# Patient Record
Sex: Male | Born: 1945 | Race: White | Hispanic: No | Marital: Married | Smoking: Former smoker
Health system: Southern US, Community
[De-identification: ages and names within clinical notes are randomized; demographics above are authoritative.]

## PROBLEM LIST (undated history)

## (undated) ENCOUNTER — Ambulatory Visit: Admission: EM | Payer: Managed Care, Other (non HMO) | Source: Home / Self Care

## (undated) ENCOUNTER — Ambulatory Visit: Admission: EM | Source: Home / Self Care

## (undated) DIAGNOSIS — Z889 Allergy status to unspecified drugs, medicaments and biological substances status: Secondary | ICD-10-CM

## (undated) DIAGNOSIS — E785 Hyperlipidemia, unspecified: Secondary | ICD-10-CM

## (undated) DIAGNOSIS — J189 Pneumonia, unspecified organism: Secondary | ICD-10-CM

## (undated) DIAGNOSIS — K649 Unspecified hemorrhoids: Secondary | ICD-10-CM

## (undated) DIAGNOSIS — F419 Anxiety disorder, unspecified: Secondary | ICD-10-CM

## (undated) DIAGNOSIS — Z860100 Personal history of colon polyps, unspecified: Secondary | ICD-10-CM

## (undated) DIAGNOSIS — I2510 Atherosclerotic heart disease of native coronary artery without angina pectoris: Secondary | ICD-10-CM

## (undated) DIAGNOSIS — J449 Chronic obstructive pulmonary disease, unspecified: Secondary | ICD-10-CM

## (undated) DIAGNOSIS — K219 Gastro-esophageal reflux disease without esophagitis: Secondary | ICD-10-CM

## (undated) DIAGNOSIS — I10 Essential (primary) hypertension: Secondary | ICD-10-CM

## (undated) DIAGNOSIS — A4902 Methicillin resistant Staphylococcus aureus infection, unspecified site: Secondary | ICD-10-CM

## (undated) DIAGNOSIS — M1990 Unspecified osteoarthritis, unspecified site: Secondary | ICD-10-CM

## (undated) DIAGNOSIS — E119 Type 2 diabetes mellitus without complications: Secondary | ICD-10-CM

## (undated) HISTORY — PX: RETINAL TEAR REPAIR CRYOTHERAPY: SHX5304

## (undated) HISTORY — DX: Unspecified hemorrhoids: K64.9

## (undated) HISTORY — PX: EYE SURGERY: SHX253

## (undated) HISTORY — DX: Chronic obstructive pulmonary disease, unspecified: J44.9

## (undated) HISTORY — PX: COLONOSCOPY WITH ESOPHAGOGASTRODUODENOSCOPY (EGD): SHX5779

## (undated) HISTORY — DX: Personal history of colonic polyps: Z86.010

## (undated) HISTORY — DX: Personal history of colon polyps, unspecified: Z86.0100

## (undated) HISTORY — PX: CERVICAL SPINE SURGERY: SHX589

## (undated) HISTORY — DX: Type 2 diabetes mellitus without complications: E11.9

## (undated) HISTORY — PX: HEMORRHOID SURGERY: SHX153

## (undated) HISTORY — DX: Essential (primary) hypertension: I10

## (undated) HISTORY — DX: Unspecified osteoarthritis, unspecified site: M19.90

## (undated) HISTORY — DX: Hyperlipidemia, unspecified: E78.5

---

## 2006-11-06 ENCOUNTER — Ambulatory Visit: Payer: Self-pay | Admitting: Internal Medicine

## 2007-04-30 ENCOUNTER — Ambulatory Visit: Payer: Self-pay | Admitting: Family Medicine

## 2007-05-03 ENCOUNTER — Ambulatory Visit: Payer: Self-pay | Admitting: Internal Medicine

## 2007-07-23 ENCOUNTER — Ambulatory Visit: Payer: Self-pay | Admitting: Family Medicine

## 2007-07-23 IMAGING — CR DG CHEST 2V
1 series · 3 of 3 positions shown · non-contrast
Comparison: none

REASON FOR EXAM: congestion with sob
COMMENTS:

[Series 1: view not recorded · 0.17mm/px · 3 of 3 slices shown]
[im 1/3]
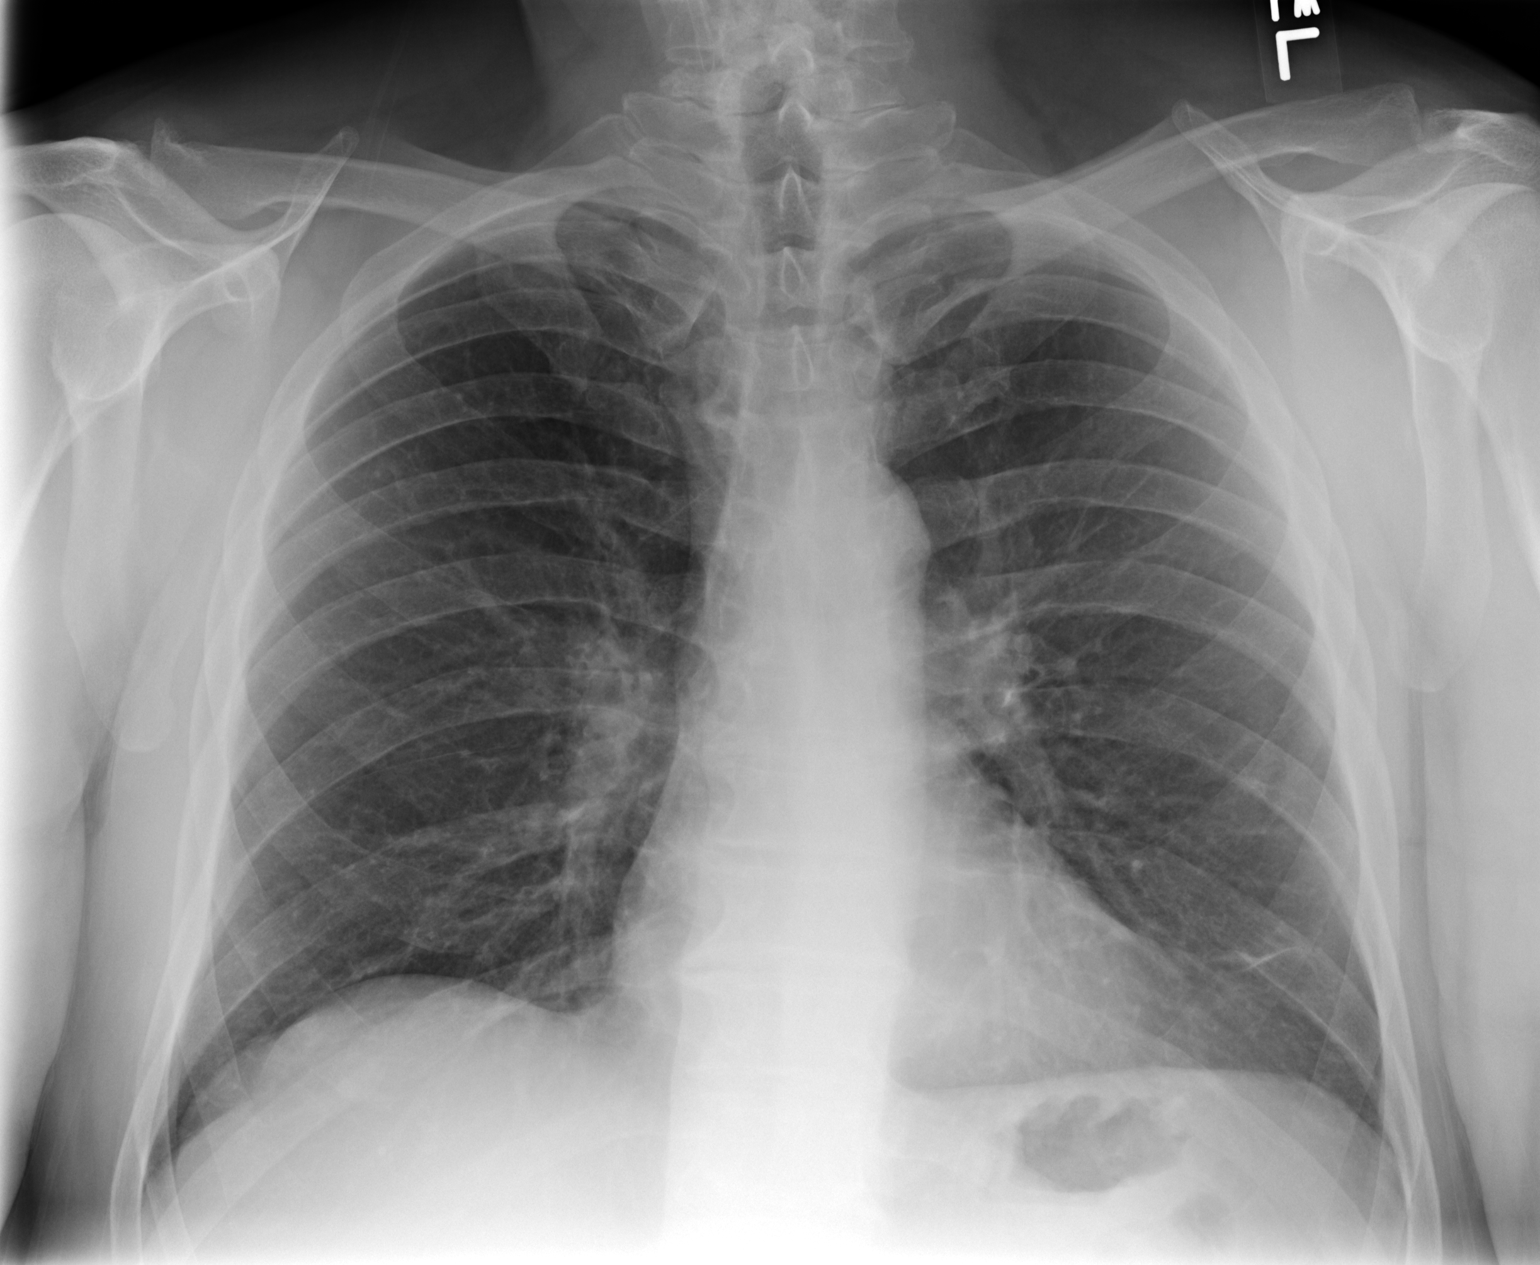
[im 2/3]
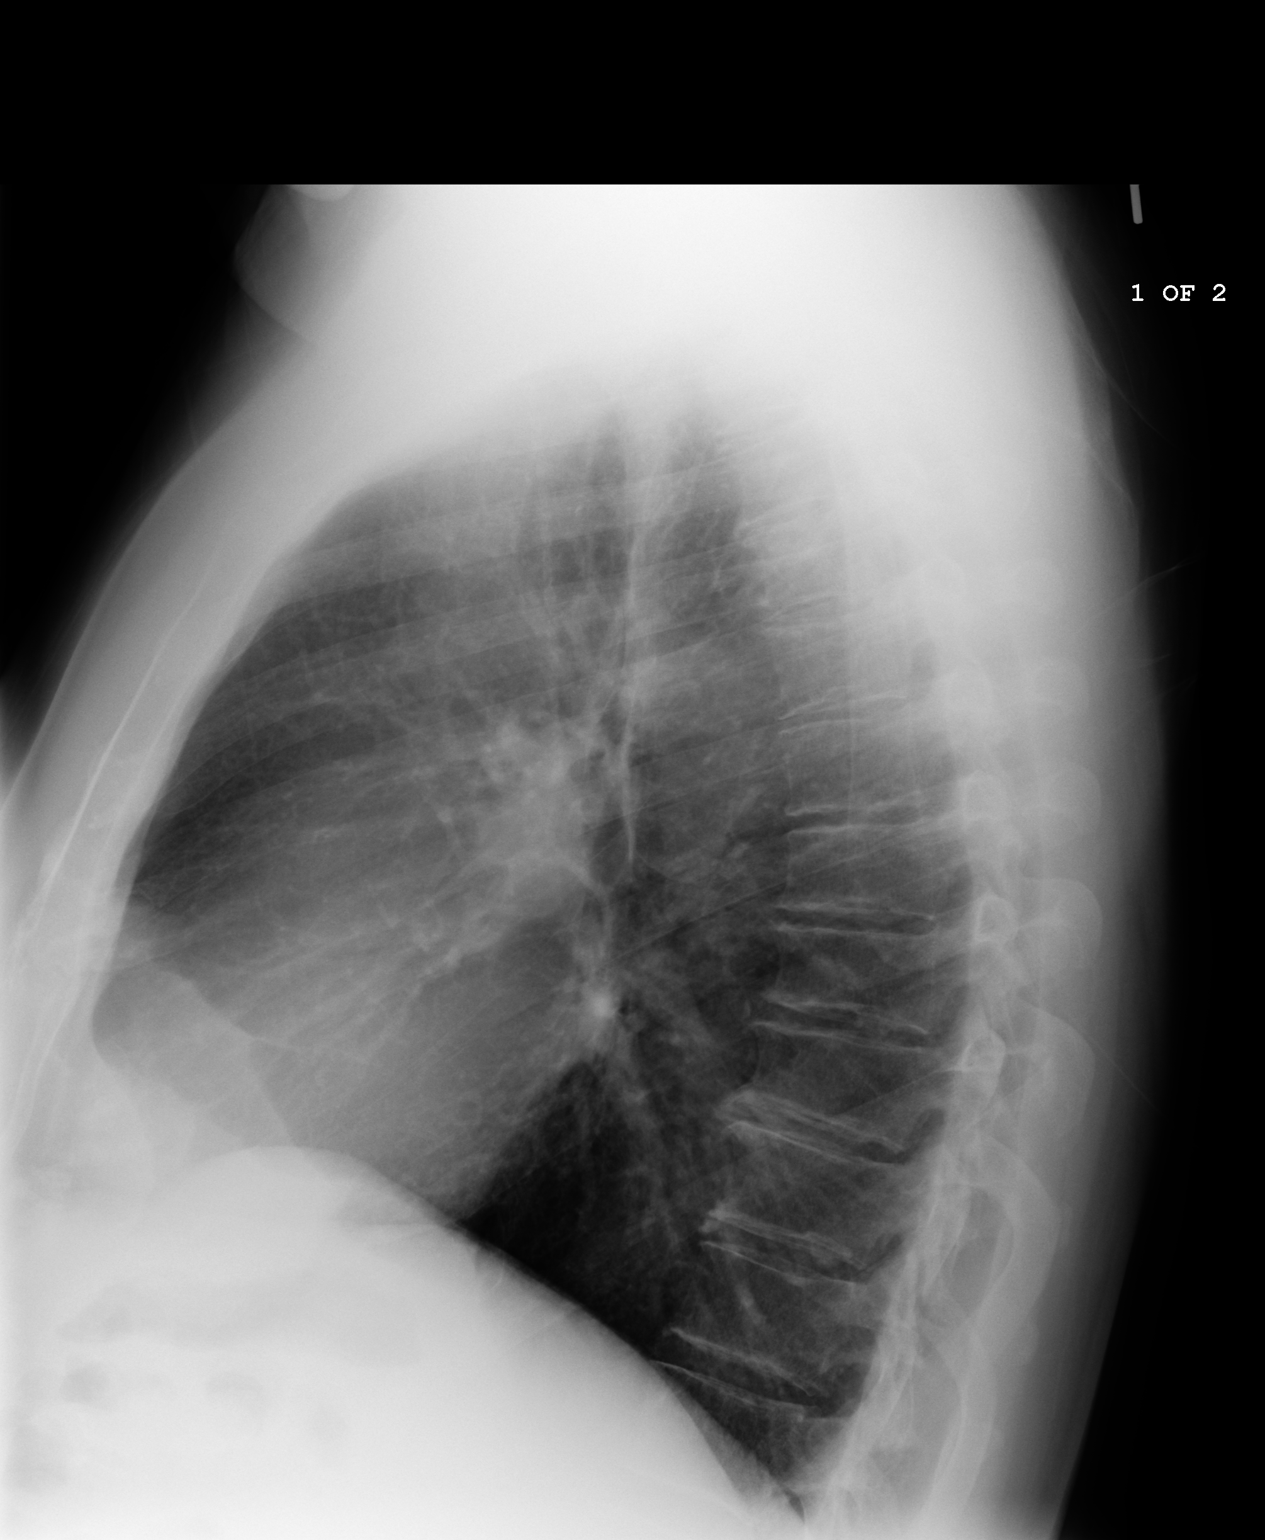
[im 3/3]
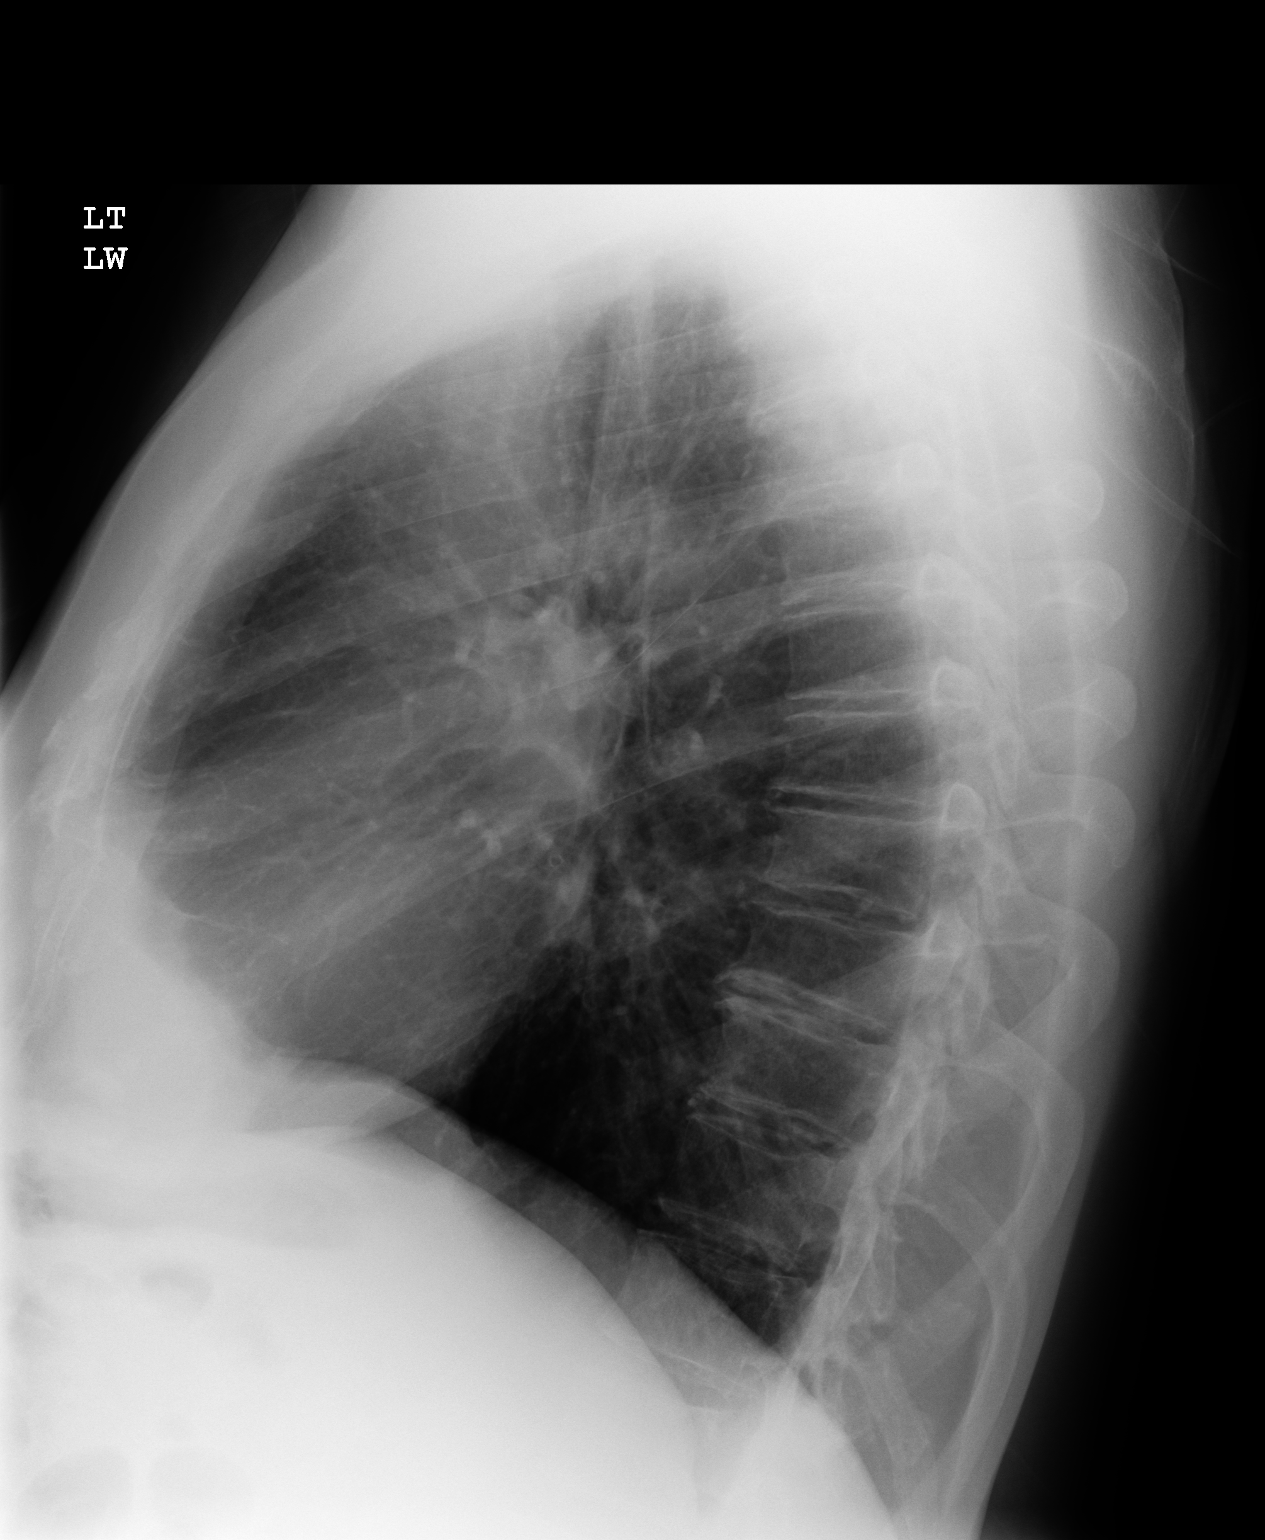

[3 of 3 positions shown; findings below may reference images not displayed]

PROCEDURE:     MDR - MDR CHEST PA(OR AP) AND LATERAL  - [DATE] [DATE]

RESULT:     There is no previous exam for comparison. There is mild
hyperinflation consistent with COPD. The lungs are clear. The heart and
pulmonary vessels are normal. The bony and mediastinal structures are
unremarkable. There is no effusion. There is no pneumothorax or evidence of
congestive failure.
IMPRESSION: No acute cardiopulmonary disease. Changes suggestive of
COPD.

## 2007-11-25 ENCOUNTER — Ambulatory Visit: Payer: Self-pay | Admitting: Family Medicine

## 2008-02-03 ENCOUNTER — Ambulatory Visit: Payer: Self-pay | Admitting: Internal Medicine

## 2008-02-17 DIAGNOSIS — I219 Acute myocardial infarction, unspecified: Secondary | ICD-10-CM

## 2008-02-17 HISTORY — DX: Acute myocardial infarction, unspecified: I21.9

## 2008-02-17 HISTORY — PX: CORONARY ARTERY BYPASS GRAFT: SHX141

## 2008-04-16 HISTORY — PX: VEIN BYPASS SURGERY: SHX833

## 2008-04-22 ENCOUNTER — Ambulatory Visit: Payer: Self-pay | Admitting: Family Medicine

## 2008-04-22 IMAGING — CR DG CHEST 2V
1 series · 2 of 2 positions shown · non-contrast
Comparison: none

REASON FOR EXAM: cough
COMMENTS:

PROCEDURE:     MDR - MDR CHEST PA(OR AP) AND LATERAL  - [DATE]  [DATE]
RESULT:     Mediastinal and hilar structures are normal. Changes consistant
with pleuroparenchymal scarring and/or COPD noted.
No acute cardiopulmonary disease noted.

[Series 1: view not recorded · 0.17mm/px · 2 of 2 slices shown]
[im 1/2]
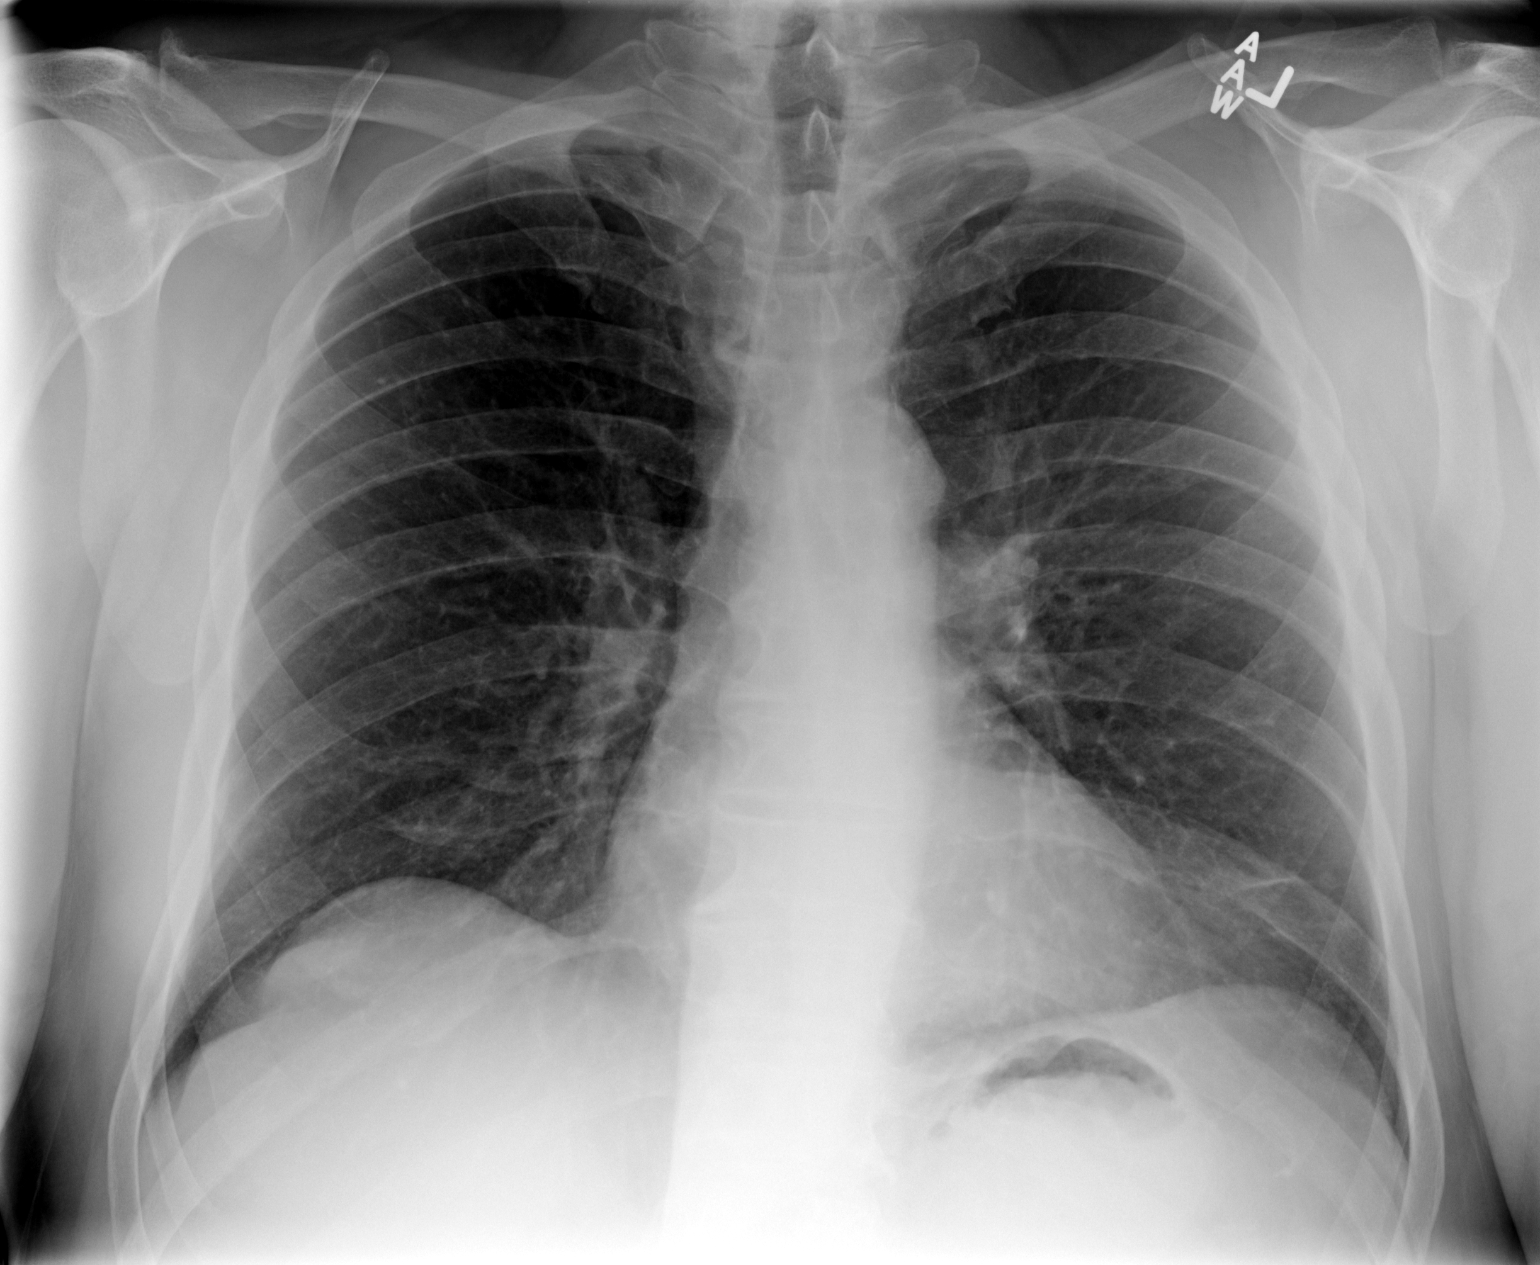
[im 2/2]
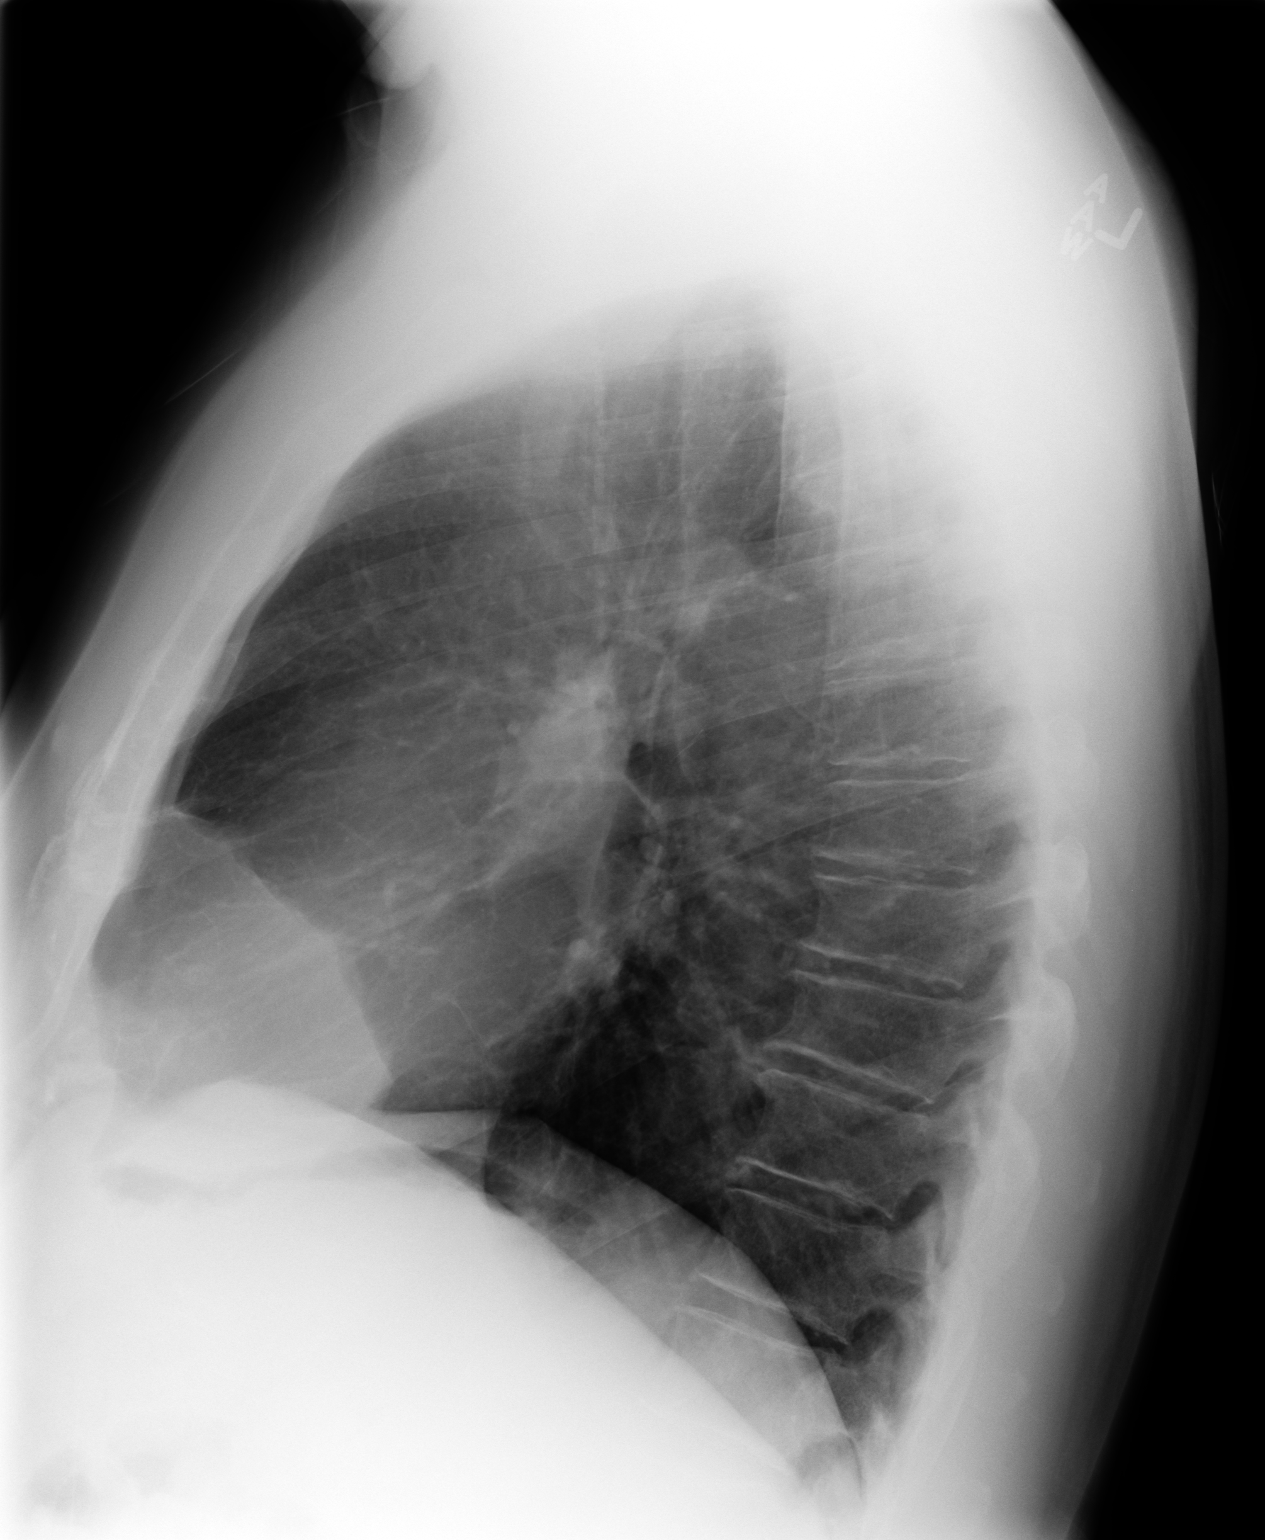

[2 of 2 positions shown; findings below may reference images not displayed]

IMPRESSION: No acute cardiopulmonary disease noted.

## 2009-01-03 ENCOUNTER — Ambulatory Visit: Payer: Self-pay | Admitting: Family Medicine

## 2009-01-03 ENCOUNTER — Emergency Department: Payer: Self-pay | Admitting: Unknown Physician Specialty

## 2009-01-03 IMAGING — CR DG CHEST 1V PORT
1 series · 1 of 1 positions shown · non-contrast
Comparison: none

REASON FOR EXAM: cp
COMMENTS:

[view not recorded]
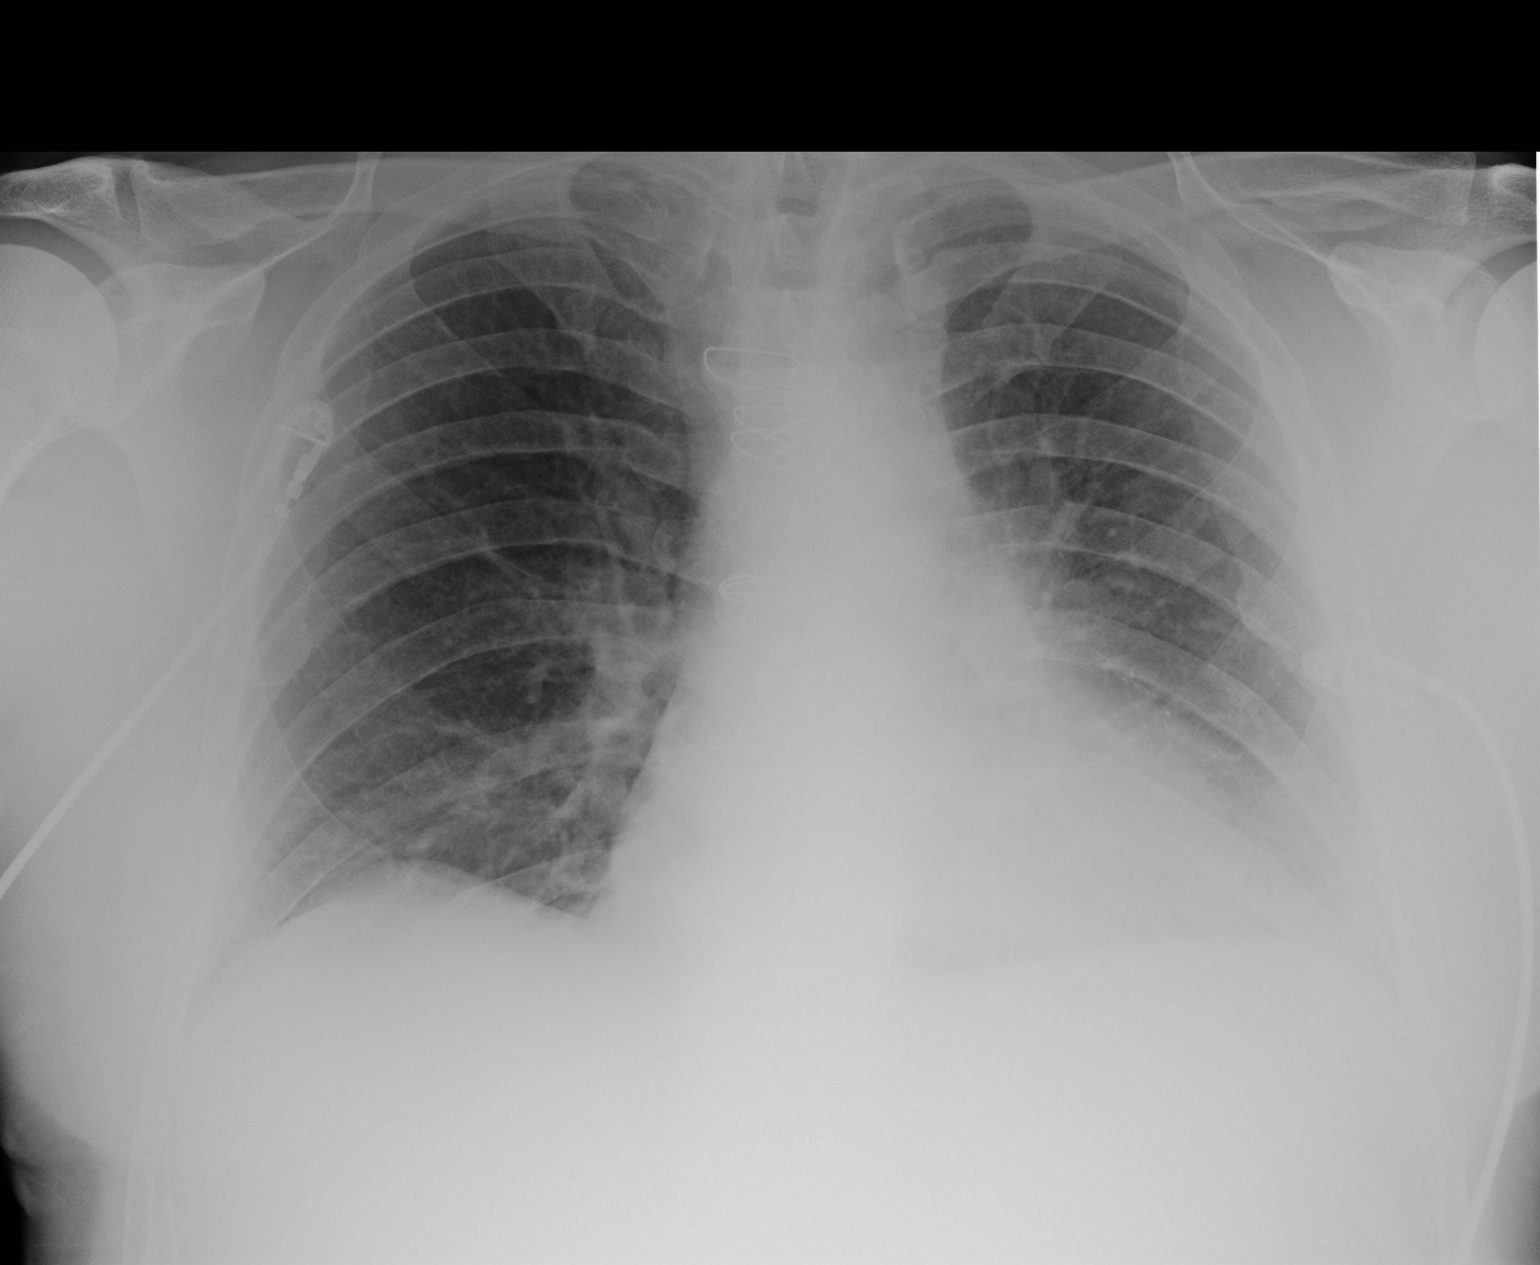

[1 of 1 positions shown; findings below may reference images not displayed]

PROCEDURE:     DXR - DXR PORTABLE CHEST SINGLE VIEW  - [DATE]  [DATE]

RESULT:     Comparison is made to the study [DATE].

The lungs are adequately inflated. The interstitial markings are increased
especially on the left. I suspect that there is lingular atelectasis. I see
no pleural effusion or pulmonary vascular congestion.
IMPRESSION: Increased density on the left likely reflects lingular
atelectasis. A followup PA and lateral chest x-ray would be of value.

## 2009-02-12 ENCOUNTER — Ambulatory Visit: Payer: Self-pay | Admitting: Family Medicine

## 2009-06-14 ENCOUNTER — Ambulatory Visit: Payer: Self-pay | Admitting: Family Medicine

## 2009-10-06 ENCOUNTER — Ambulatory Visit: Payer: Self-pay | Admitting: Family Medicine

## 2009-11-29 ENCOUNTER — Ambulatory Visit: Payer: Self-pay | Admitting: Internal Medicine

## 2010-02-03 ENCOUNTER — Ambulatory Visit: Payer: Self-pay | Admitting: Gastroenterology

## 2010-02-04 ENCOUNTER — Ambulatory Visit: Payer: Self-pay | Admitting: Gastroenterology

## 2010-02-06 LAB — PATHOLOGY REPORT

## 2010-03-12 ENCOUNTER — Ambulatory Visit: Payer: Self-pay | Admitting: Family Medicine

## 2010-04-10 ENCOUNTER — Ambulatory Visit: Payer: Self-pay | Admitting: Internal Medicine

## 2010-04-10 IMAGING — CR DG CHEST 2V
1 series · 4 of 4 positions shown · non-contrast
Comparison: none

REASON FOR EXAM: r/o pneumonia
COMMENTS:

[Series 1: view not recorded · 0.17mm/px · 4 of 4 slices shown]
[im 1/4]
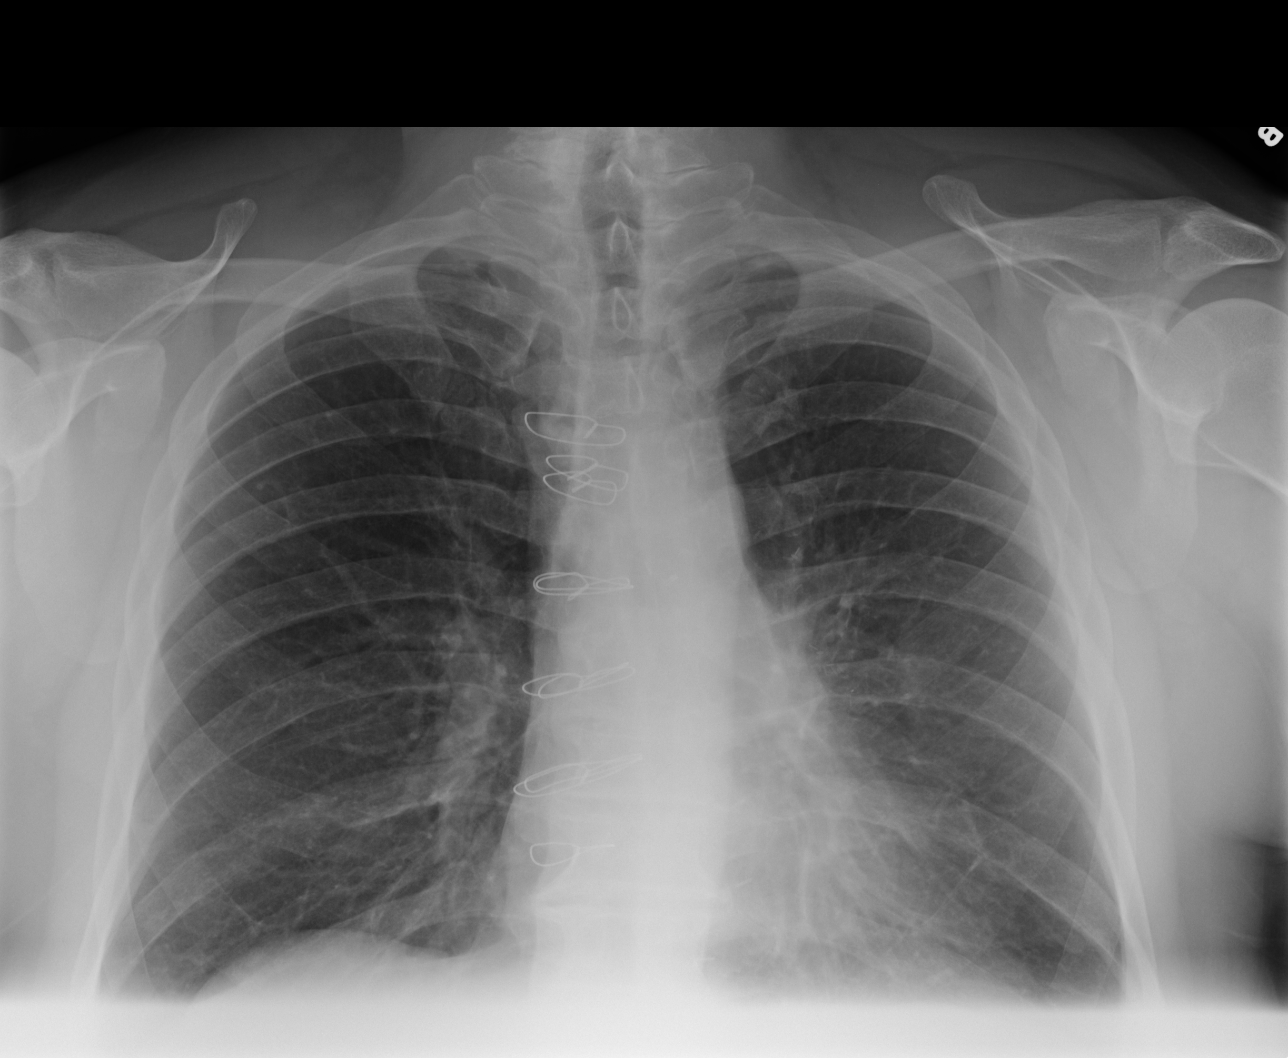
[im 2/4]
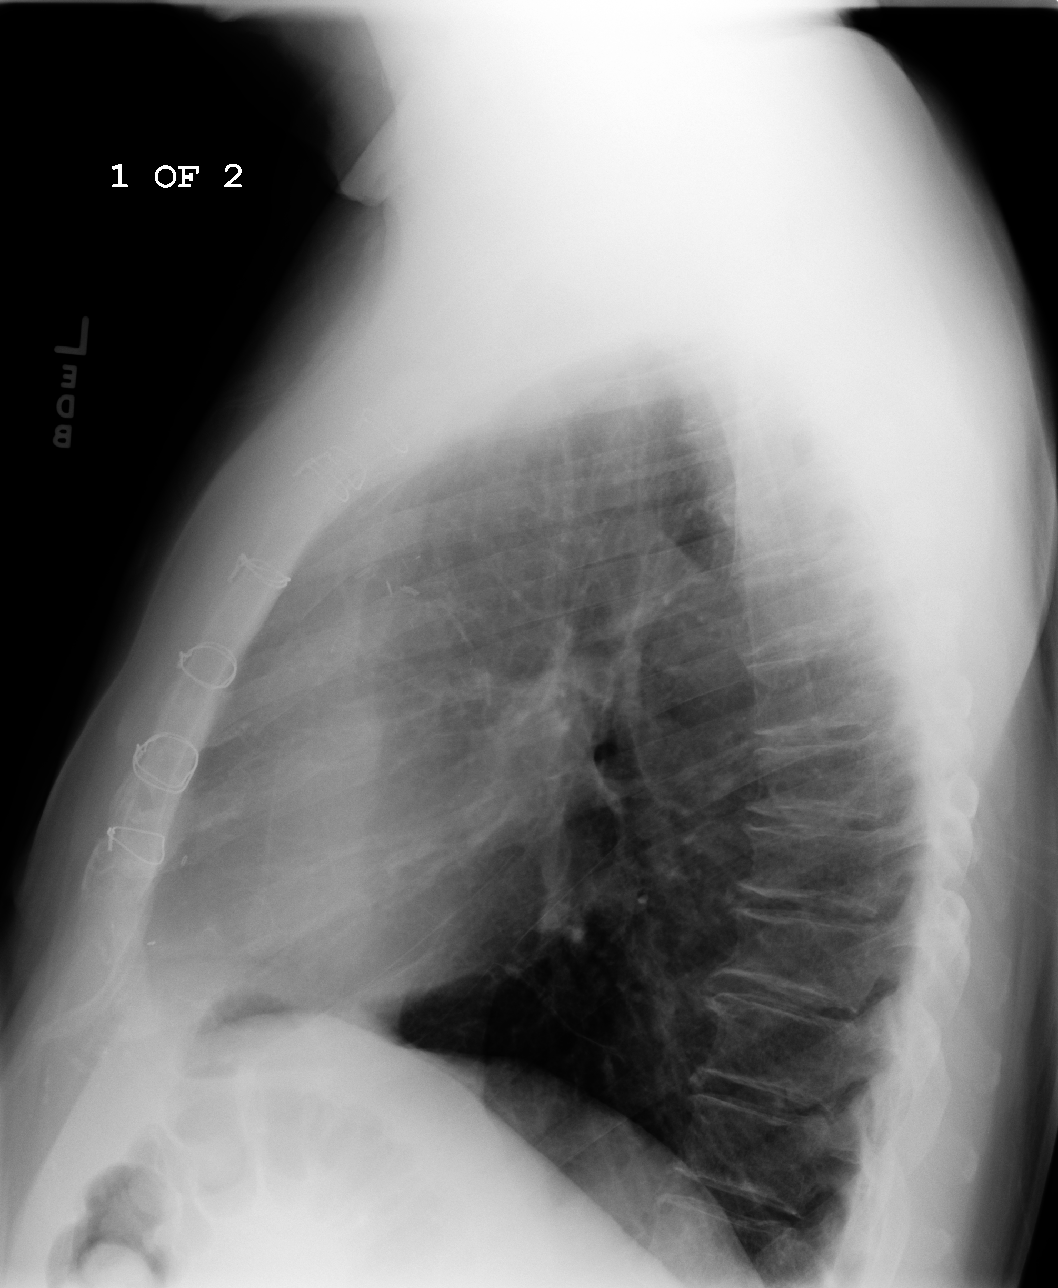
[im 3/4]
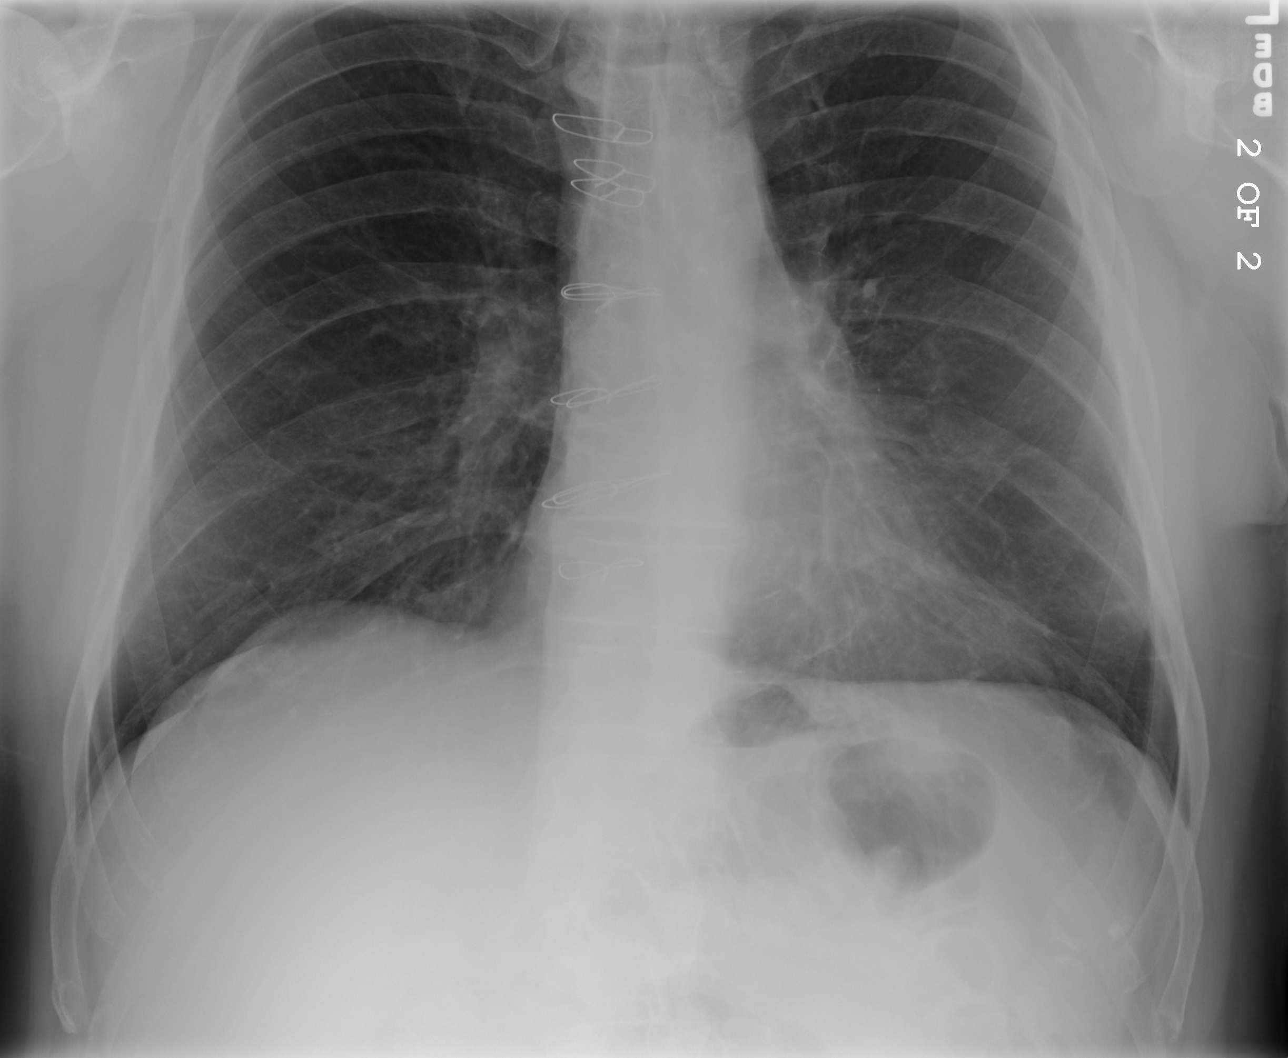
[im 4/4]
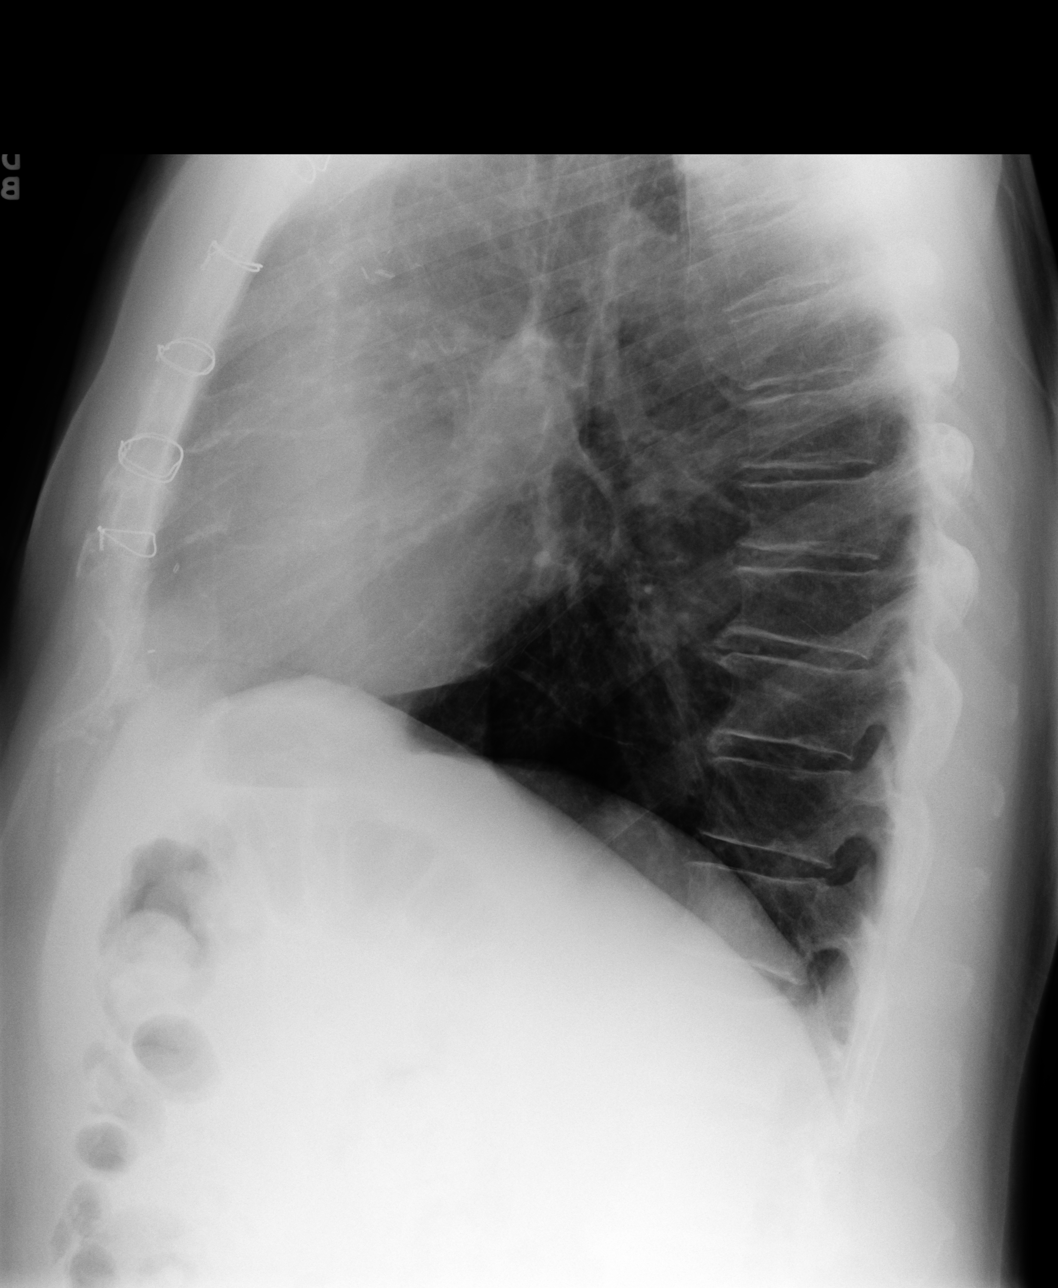

[4 of 4 positions shown; findings below may reference images not displayed]

PROCEDURE:     MDR - MDR CHEST PA(OR AP) AND LATERAL  - [DATE]  [DATE]

RESULT:     Comparison is made to the study of [DATE].

Sternotomy wires are present. The lungs are mildly hyperinflated. There is
no definite edema, infiltrate, effusion or pneumothorax. Degenerative
changes are seen in the spine.
IMPRESSION: 1. Findings consistent with COPD.
2. No acute cardiopulmonary disease evident.

## 2010-10-13 ENCOUNTER — Ambulatory Visit: Payer: Self-pay

## 2011-06-27 ENCOUNTER — Ambulatory Visit: Payer: Self-pay

## 2011-07-05 ENCOUNTER — Ambulatory Visit: Payer: Self-pay | Admitting: Internal Medicine

## 2011-08-04 ENCOUNTER — Emergency Department: Payer: Self-pay | Admitting: Internal Medicine

## 2011-08-04 LAB — CBC
HCT: 46.8 % (ref 40.0–52.0)
HGB: 16 g/dL (ref 13.0–18.0)
MCH: 33.7 pg (ref 26.0–34.0)
MCHC: 34.3 g/dL (ref 32.0–36.0)
MCV: 98 fL (ref 80–100)
Platelet: 160 10*3/uL (ref 150–440)
RBC: 4.76 10*6/uL (ref 4.40–5.90)
RDW: 13.3 % (ref 11.5–14.5)
WBC: 9.2 10*3/uL (ref 3.8–10.6)

## 2011-08-04 LAB — BASIC METABOLIC PANEL
Anion Gap: 8 (ref 7–16)
Calcium, Total: 8.7 mg/dL (ref 8.5–10.1)
EGFR (African American): >60
EGFR (Non-African Amer.): >60
Glucose: 115 mg/dL — ABNORMAL HIGH (ref 65–99)
Osmolality: 285 (ref 275–301)
Potassium: 4.4 mmol/L (ref 3.5–5.1)

## 2011-08-04 LAB — CK TOTAL AND CKMB (NOT AT ARMC): CK, Total: 102 U/L (ref 35–232)

## 2011-08-04 LAB — TROPONIN I
Troponin-I: <0.02 ng/mL
Troponin-I: <0.02 ng/mL

## 2011-08-04 IMAGING — CR DG CHEST 2V
1 series · 3 of 3 positions shown · non-contrast
Comparison: none

REASON FOR EXAM: chest pressure
COMMENTS:

[Series 1: w chest pa · 0.14mm/px · 3 of 3 slices shown]
[im 1/3]
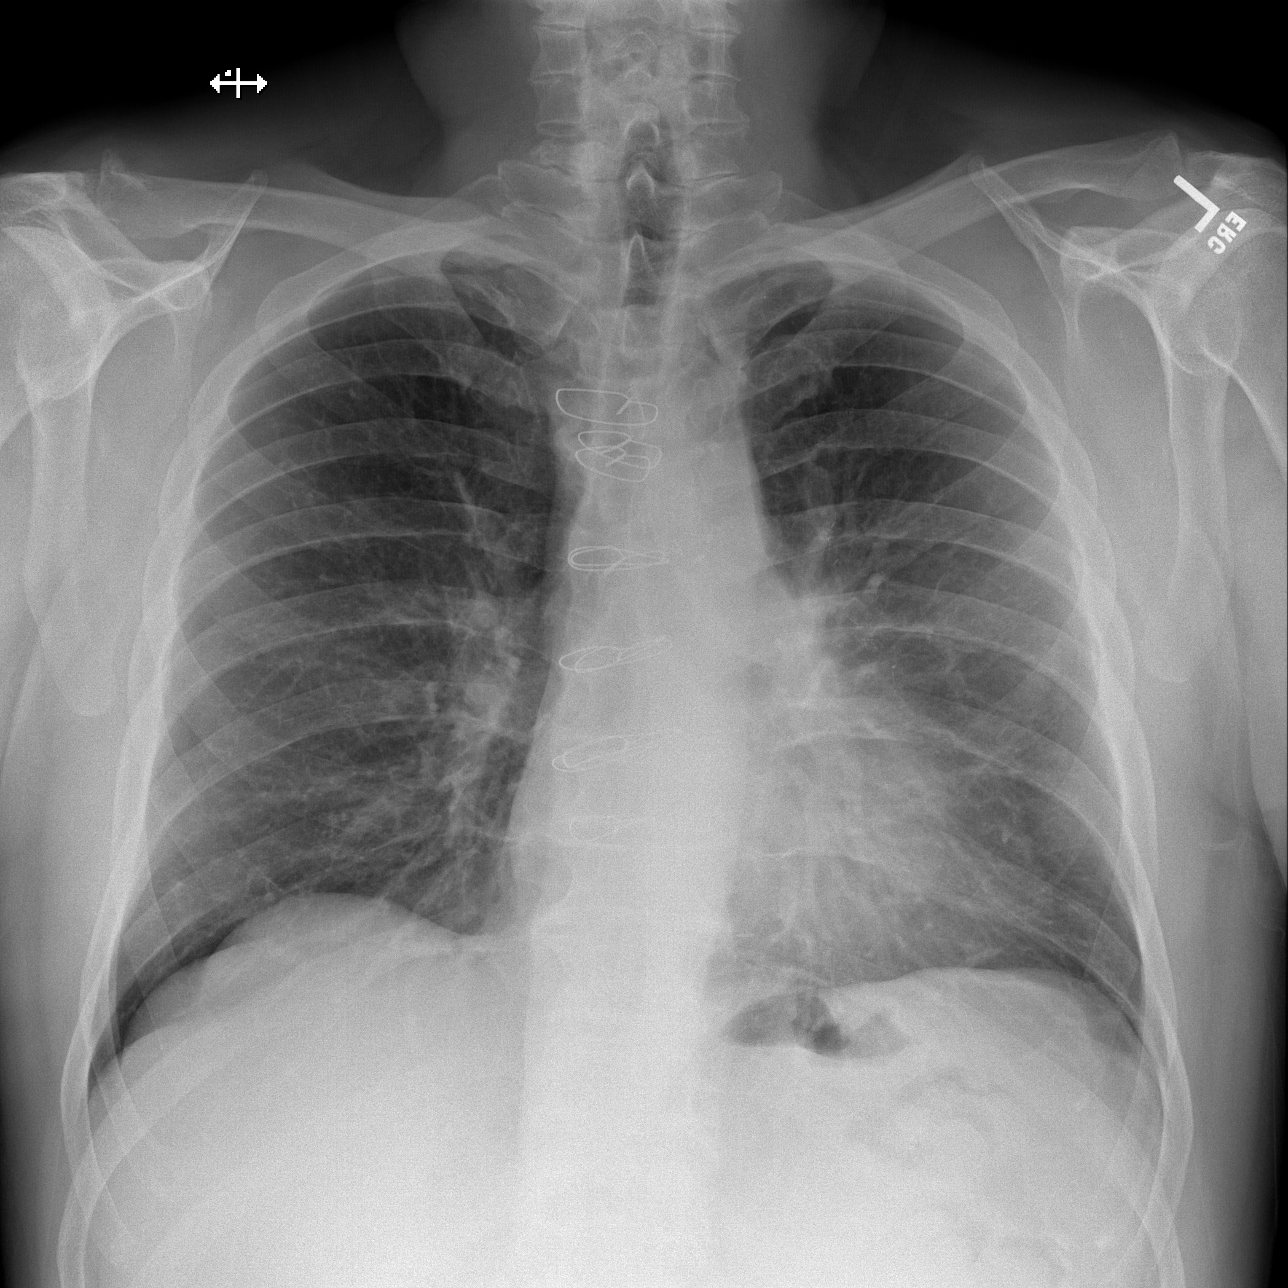
[im 2/3]
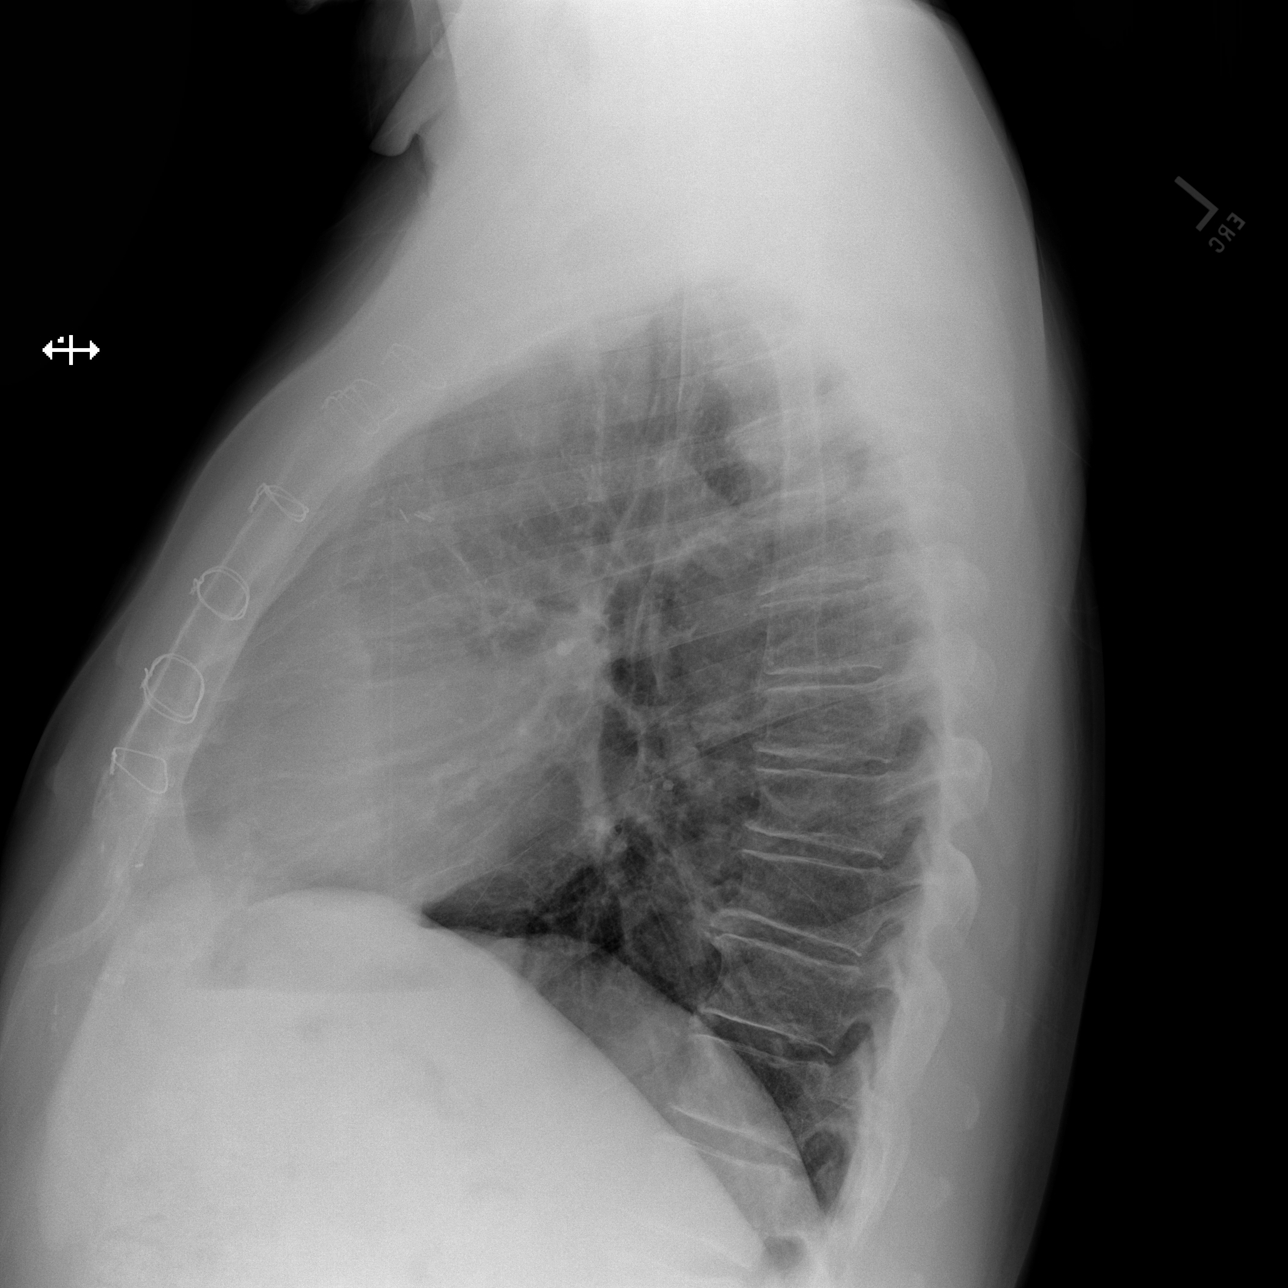
[im 3/3]
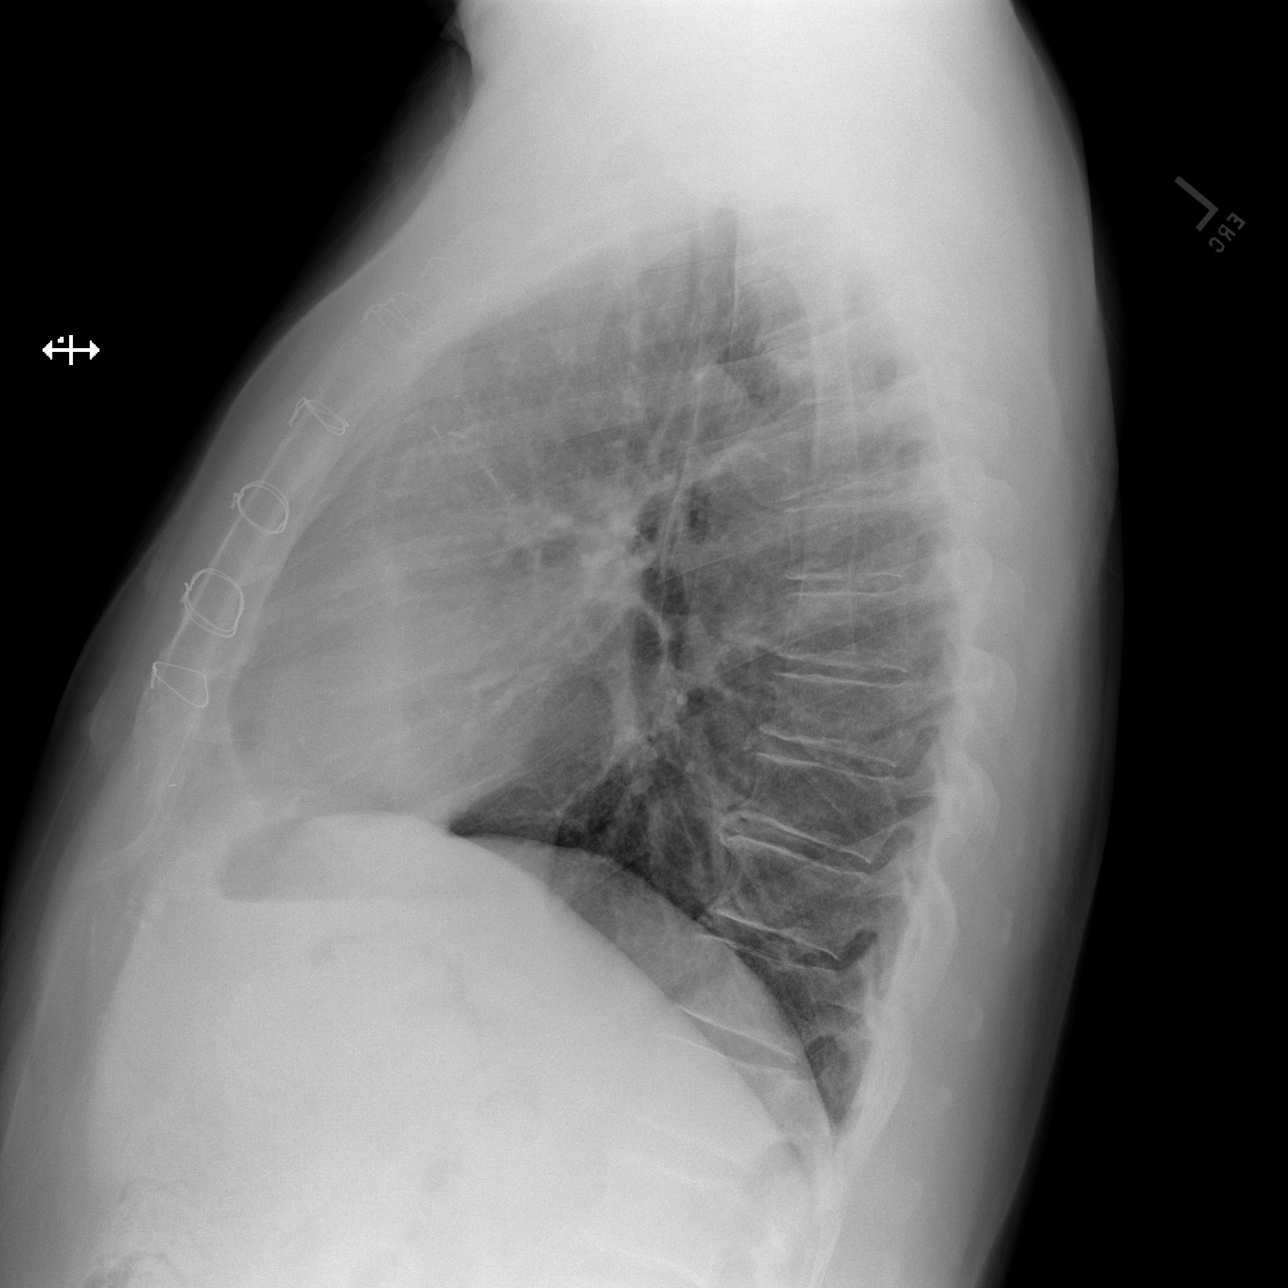

[3 of 3 positions shown; findings below may reference images not displayed]

PROCEDURE:     DXR - DXR CHEST PA (OR AP) AND LATERAL  - [DATE]  [DATE]

RESULT:     Comparison is made to the prior exam of [DATE]. The lung
fields are clear of infiltrate. The heart, mediastinal and osseous
structures show no significant abnormalities. Postoperative changes
consistent with prior CABG are again observed.
IMPRESSION: No acute changes are identified.

[REDACTED]

## 2011-10-25 ENCOUNTER — Ambulatory Visit: Payer: Self-pay | Admitting: Medical

## 2011-10-25 IMAGING — CR DG CHEST 2V
1 series · 3 of 3 positions shown · non-contrast
Comparison: none

REASON FOR EXAM: SOB
COMMENTS:

[Series 1: pa · 0.17mm/px · 3 of 3 slices shown]
[im 1/3]
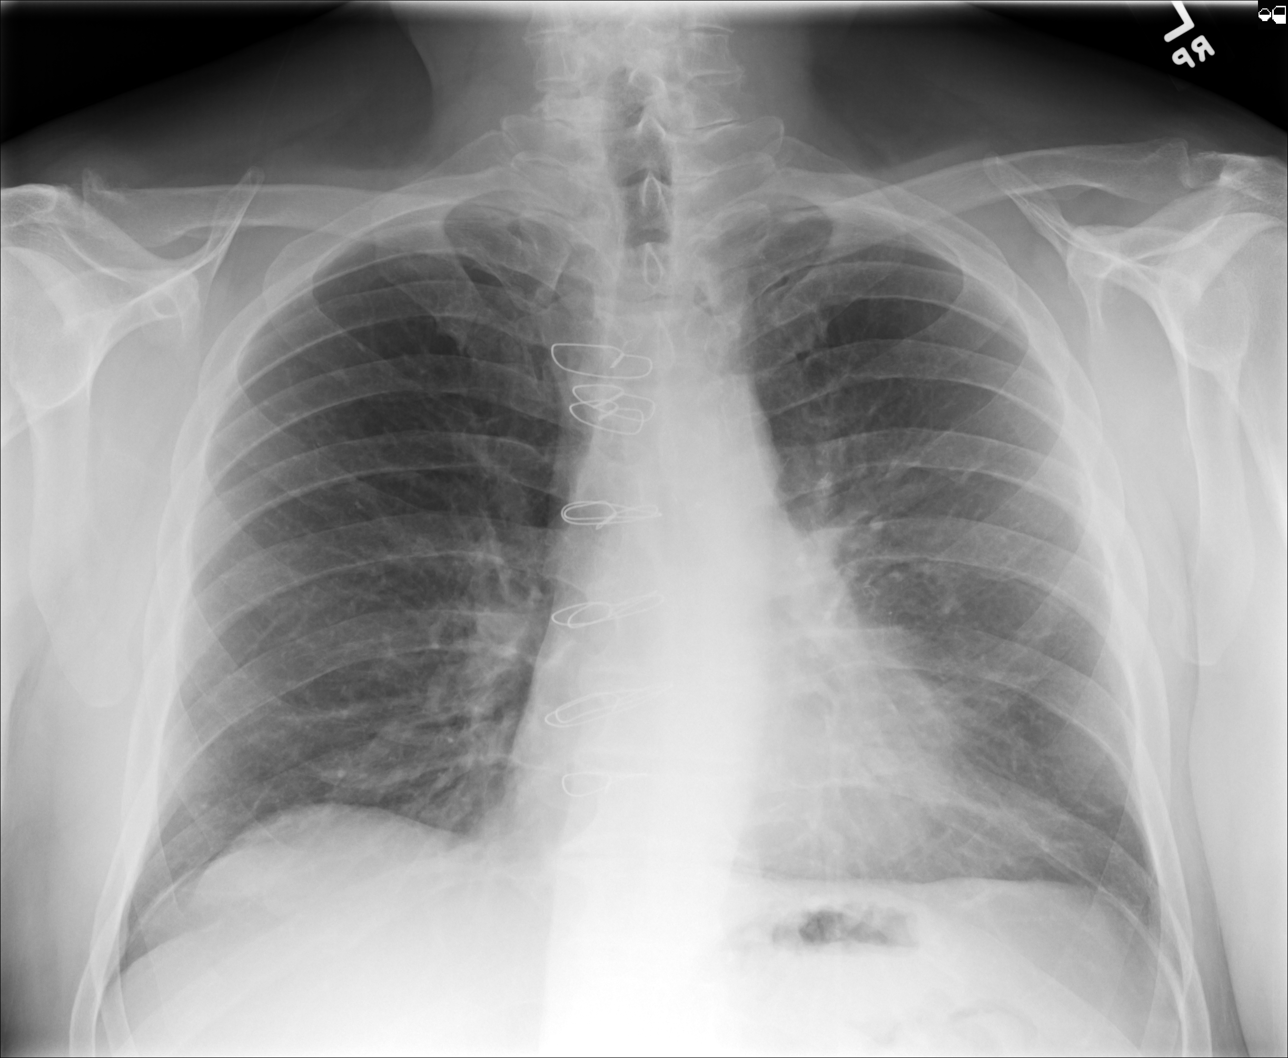
[im 2/3]
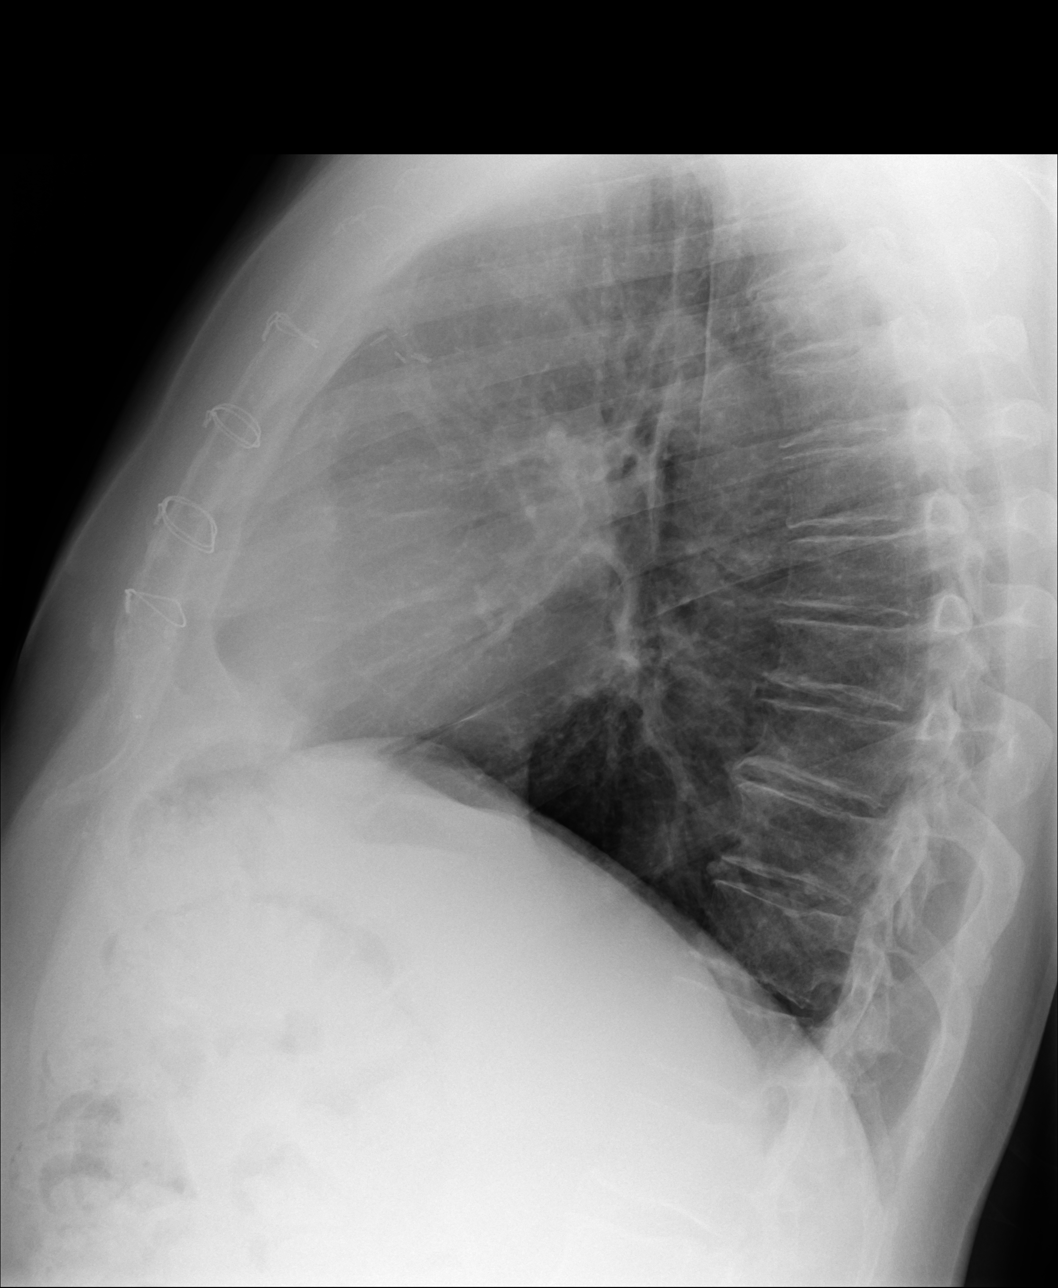
[im 3/3]
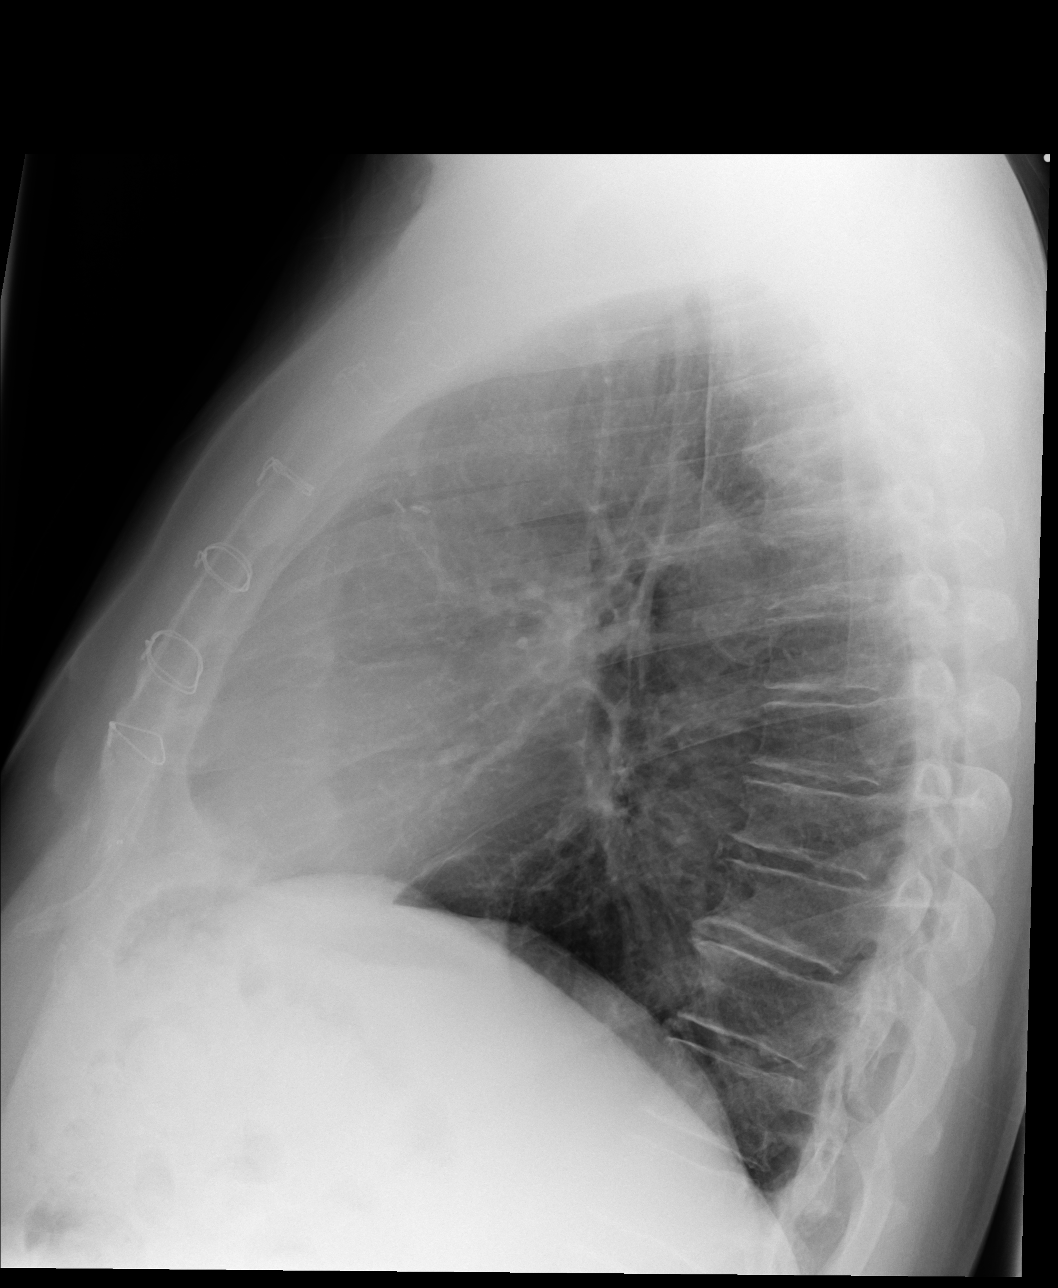

[3 of 3 positions shown; findings below may reference images not displayed]

PROCEDURE:     MDR - MDR CHEST PA(OR AP) AND LATERAL  - [DATE]  [DATE]

RESULT:     Comparison is made to previous chest films from [DATE]
and [DATE].

The lungs are reasonably well inflated. The perihilar lung markings are
mildly prominent on the frontal film but not significantly abnormal on the
lateral film. Silhouetting of the left heart border is a chronic finding and
is likely related to the previous median sternotomy. There is no pleural
effusion. There is no alveolar infiltrate. The cardiac silhouette is not
enlarged. The pulmonary vascularity does not demonstrates cephalization.
IMPRESSION: The findings likely reflect perihilar subsegmental
atelectasis as can be seen with acute bronchitis. Low-grade compensated CHF
cannot be dogmatically excluded though I favor the former. Followup films
following anticipated antibiotic therapy are recommended especially if the
patient's symptoms do not improve.

[REDACTED]

## 2011-11-02 ENCOUNTER — Other Ambulatory Visit: Payer: Self-pay

## 2011-11-02 LAB — CK TOTAL AND CKMB (NOT AT ARMC): CK-MB: 1.4 ng/mL (ref 0.5–3.6)

## 2011-11-02 LAB — TROPONIN I: Troponin-I: <0.02 ng/mL

## 2012-03-14 ENCOUNTER — Ambulatory Visit: Payer: Self-pay | Admitting: Gastroenterology

## 2012-03-15 LAB — PATHOLOGY REPORT

## 2013-02-27 ENCOUNTER — Ambulatory Visit (INDEPENDENT_AMBULATORY_CARE_PROVIDER_SITE_OTHER): Payer: Managed Care, Other (non HMO) | Admitting: General Surgery

## 2013-02-27 ENCOUNTER — Encounter: Payer: Self-pay | Admitting: General Surgery

## 2013-02-27 VITALS — BP 118/76 | HR 66 | Resp 13 | Ht 72.0 in | Wt 252.0 lb

## 2013-02-27 DIAGNOSIS — K645 Perianal venous thrombosis: Secondary | ICD-10-CM

## 2013-02-27 MED ORDER — HYDROCORTISONE ACE-PRAMOXINE 2.5-1 % RE CREA: 1.0000 "application " | TOPICAL_CREAM | Freq: Three times a day (TID) | RECTAL | Status: DC

## 2013-02-27 NOTE — Patient Instructions (Addendum)
Use warm water soaks. Use ointment twice a day

## 2013-02-27 NOTE — Progress Notes (Signed)
Patient ID: Zachary Trevino, male   DOB: 07-11-1945, 68 y.o.   MRN: [id number]  Chief Complaint  Patient presents with  . Other    hemorrhoids    HPI Zachary Trevino is a 68 y.o. male  Referred here by Dr Verneda Skill due to inflamed hemorrhoids. This started about one week ago. He has been hemorrhoidal suppositories with very little improvement. He reports a small amount of blood on toilet paper when wiping, but no active bleeding. He does report some constipation at times. He has had two prior hemorrhoid surgeries about 20 years ago.   HPI  Past Medical History  Diagnosis Date  . Diabetes mellitus without complication     diet controlled  . Hemorrhoids   . COPD (chronic obstructive pulmonary disease)   . Hypertension   . Hyperlipidemia   . History of colon polyps   . Arthritis     Past Surgical History  Procedure Laterality Date  . Vein bypass surgery  04/2008    3 vessel with vein graft and stent placement  . Hemorrhoid surgery  20 years ago    twice    Family History  Problem Relation Age of Onset  . Prostate cancer Father     Social History History  Substance Use Topics  . Smoking status: Former Smoker -- 25 years    Types: Cigarettes    Quit date: 02/16/1978  . Smokeless tobacco: Never Used  . Alcohol Use: Yes    No Known Allergies  Current Outpatient Prescriptions  Medication Sig Dispense Refill  . Alpha-Lipoic Acid 200 MG CAPS Take 1 capsule by mouth daily.      Marland Kitchen amLODipine (NORVASC) 5 MG tablet Take 5 mg by mouth daily.       Marland Kitchen aspirin EC 81 MG tablet Take 81 mg by mouth daily.      . Cinnamon 500 MG TABS Take 1 each by mouth daily.      Marland Kitchen COENZYME Q-10 PO Take 1 capsule by mouth daily.      . Fluticasone-Salmeterol (ADVAIR) 100-50 MCG/DOSE AEPB Inhale 1 puff into the lungs 2 (two) times daily.      . hydrocortisone (ANUSOL-HC) 25 MG suppository       . hydrocortisone-pramoxine (ANALPRAM-HC) 2.5-1 % rectal cream Place 1 application rectally 3 (three) times  daily.  30 g  1  . lisinopril (PRINIVIL,ZESTRIL) 10 MG tablet Take 10 mg by mouth daily.      . meloxicam (MOBIC) 7.5 MG tablet Take 7.5 mg by mouth daily.      . metoprolol tartrate (LOPRESSOR) 25 MG tablet Take 12.5 mg by mouth 2 (two) times daily.       . Nutritional Supplements (OSTEO ADVANCE) TABS Take 2 tablets by mouth daily.      . Omega-3 Fatty Acids (FISH OIL PO) Take 1 capsule by mouth daily.      Marland Kitchen omeprazole (PRILOSEC) 40 MG capsule Take 40 mg by mouth daily.       . simvastatin (ZOCOR) 40 MG tablet Take 40 mg by mouth daily.       No current facility-administered medications for this visit.    Review of Systems Review of Systems  Constitutional: Negative.   Respiratory: Negative.   Cardiovascular: Negative.   Gastrointestinal: Negative.     Blood pressure 118/76, pulse 66, resp. rate 13, height 6' (1.829 m), weight 252 lb (114.306 kg).  Physical Exam Physical Exam  Constitutional: He is oriented to person, place, and time. He appears  well-developed and well-nourished.  Cardiovascular: Normal rate, regular rhythm and normal heart sounds.   Pulmonary/Chest: Effort normal and breath sounds normal.  Abdominal: Soft. Normal appearance and bowel sounds are normal. No hernia.  Genitourinary: Rectal exam shows external hemorrhoid (thrombossed). Rectal exam shows no fissure and no mass.  2 cm thrombosed external hemorrhoid at 10 o'clock   Neurological: He is alert and oriented to person, place, and time.    Data Reviewed none  Assessment    Thrombosed external hemorrhoid     Plan    Incision of hemorrhoid completed with patient consent.    Patient was placed in the left lateral position. The anal area was prepped with Betadine. 1 Demerol 1% Xylocaine was instilled. A radial incision was made and several large pieces of clot up to a centimeter in size were removed. The swelling went down considerably. Patient tolerated the procedure without any immediate  problems.   SANKAR,SEEPLAPUTHUR G 03/01/2013, 8:07 AM

## 2013-03-01 ENCOUNTER — Encounter: Payer: Self-pay | Admitting: General Surgery

## 2013-03-20 ENCOUNTER — Ambulatory Visit: Payer: Managed Care, Other (non HMO) | Admitting: General Surgery

## 2013-04-16 ENCOUNTER — Other Ambulatory Visit: Payer: Self-pay | Admitting: General Surgery

## 2013-07-08 ENCOUNTER — Ambulatory Visit: Payer: Self-pay | Admitting: Physician Assistant

## 2013-11-19 ENCOUNTER — Ambulatory Visit: Payer: Self-pay | Admitting: Internal Medicine

## 2013-11-19 IMAGING — CR DG CHEST 2V
3 series · 3 of 3 positions shown · non-contrast
Comparison: PA and lateral chest of [DATE].

CLINICAL DATA: One week history of left shoulder pain without
mention of trauma semi: Hypoxia ; history of COPD and previous CABG
; initial en-counter

EXAM:
CHEST  2 VIEW

[chest pa]
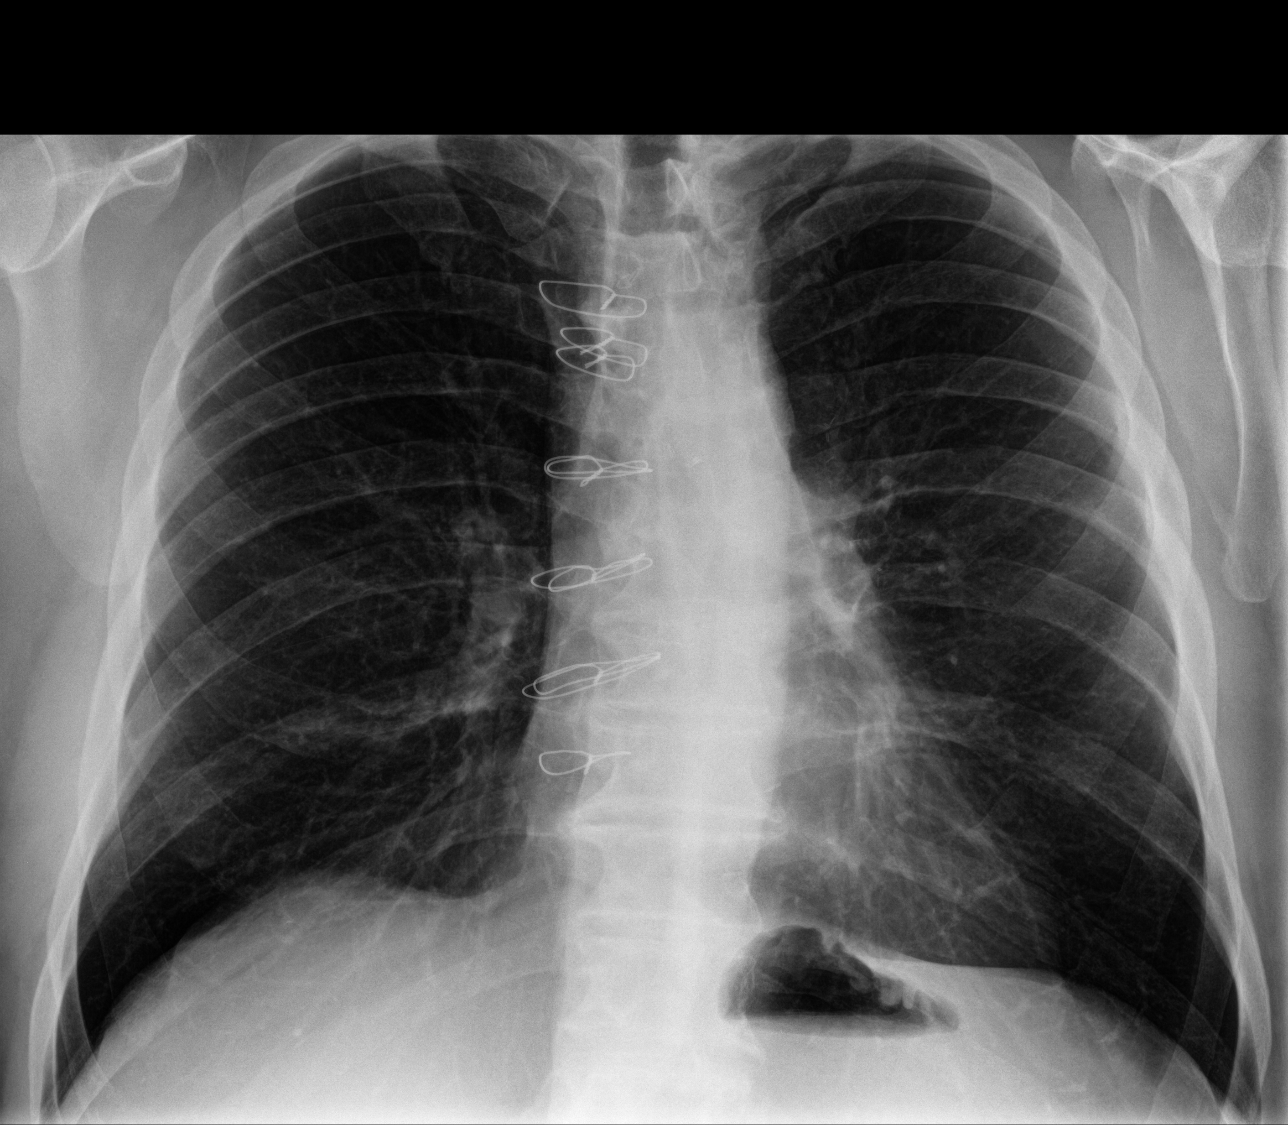

[chest lat (1 of 2)]
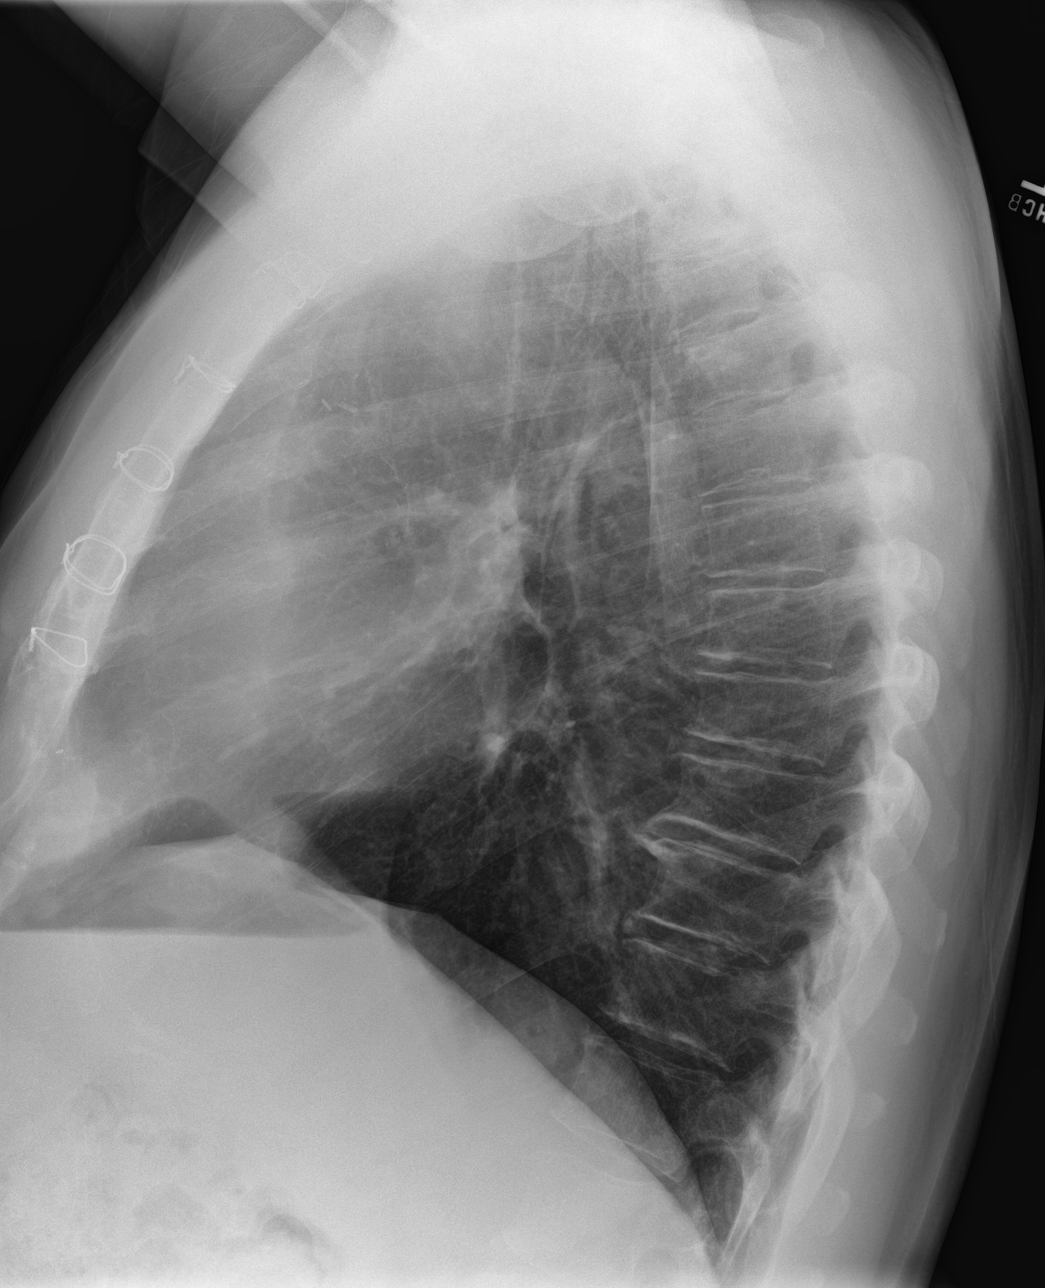

[chest lat (2 of 2)]
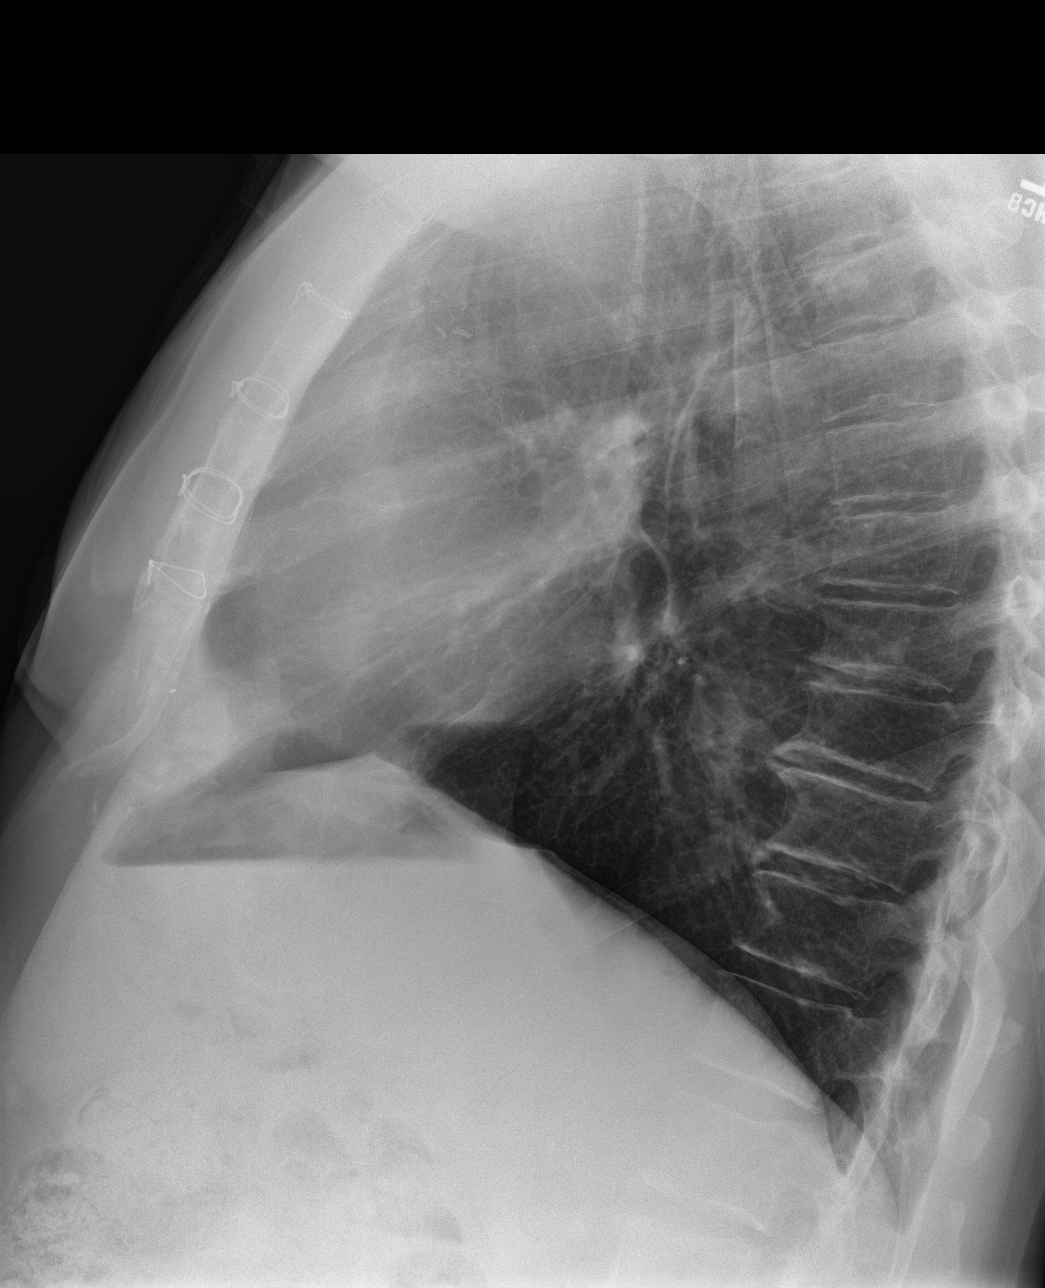

[3 of 3 positions shown; findings below may reference images not displayed]

FINDINGS: The lungs are mildly hyperinflated. There is no focal infiltrate.
There is no pleural effusion or pneumothorax. The heart is normal in
size. The pulmonary vascularity is not engorged. The mediastinum is
normal in width. There are 7 intact sternal wires present. There are
mild degenerative disc changes of the lower thoracic spine which are
stable.
IMPRESSION: COPD. There is no evidence of pneumonia nor CHF. One cannot exclude
acute bronchitis in the appropriate clinical setting.

## 2013-12-06 ENCOUNTER — Ambulatory Visit: Payer: Self-pay | Admitting: Internal Medicine

## 2017-03-04 ENCOUNTER — Other Ambulatory Visit: Payer: Self-pay | Admitting: Orthopedic Surgery

## 2017-03-04 DIAGNOSIS — G8929 Other chronic pain: Secondary | ICD-10-CM

## 2017-03-04 DIAGNOSIS — M5136 Other intervertebral disc degeneration, lumbar region: Secondary | ICD-10-CM

## 2017-03-04 DIAGNOSIS — M5442 Lumbago with sciatica, left side: Principal | ICD-10-CM

## 2017-03-15 ENCOUNTER — Ambulatory Visit: Payer: Managed Care, Other (non HMO)

## 2017-09-06 ENCOUNTER — Ambulatory Visit: Payer: Managed Care, Other (non HMO) | Admitting: Anesthesiology

## 2017-09-06 ENCOUNTER — Ambulatory Visit
Admission: RE | Admit: 2017-09-06 | Discharge: 2017-09-06 | Disposition: A | Discharge status: Home / Self Care | Payer: Managed Care, Other (non HMO) | Source: Ambulatory Visit | Attending: Gastroenterology | Admitting: Gastroenterology

## 2017-09-06 ENCOUNTER — Encounter
Admission: RE | Disposition: A | Discharge status: Home / Self Care | Payer: Self-pay | Source: Ambulatory Visit | Attending: Gastroenterology

## 2017-09-06 ENCOUNTER — Encounter: Payer: Self-pay | Admitting: Anesthesiology

## 2017-09-06 DIAGNOSIS — I10 Essential (primary) hypertension: Secondary | ICD-10-CM | POA: Diagnosis not present

## 2017-09-06 DIAGNOSIS — Z86010 Personal history of colonic polyps: Secondary | ICD-10-CM | POA: Insufficient documentation

## 2017-09-06 DIAGNOSIS — J449 Chronic obstructive pulmonary disease, unspecified: Secondary | ICD-10-CM | POA: Insufficient documentation

## 2017-09-06 DIAGNOSIS — Z7982 Long term (current) use of aspirin: Secondary | ICD-10-CM | POA: Insufficient documentation

## 2017-09-06 DIAGNOSIS — D123 Benign neoplasm of transverse colon: Secondary | ICD-10-CM | POA: Diagnosis not present

## 2017-09-06 DIAGNOSIS — K635 Polyp of colon: Secondary | ICD-10-CM | POA: Insufficient documentation

## 2017-09-06 DIAGNOSIS — Z79899 Other long term (current) drug therapy: Secondary | ICD-10-CM | POA: Diagnosis not present

## 2017-09-06 DIAGNOSIS — K621 Rectal polyp: Secondary | ICD-10-CM | POA: Insufficient documentation

## 2017-09-06 DIAGNOSIS — E119 Type 2 diabetes mellitus without complications: Secondary | ICD-10-CM | POA: Diagnosis not present

## 2017-09-06 DIAGNOSIS — Z1211 Encounter for screening for malignant neoplasm of colon: Secondary | ICD-10-CM | POA: Insufficient documentation

## 2017-09-06 DIAGNOSIS — I2510 Atherosclerotic heart disease of native coronary artery without angina pectoris: Secondary | ICD-10-CM | POA: Insufficient documentation

## 2017-09-06 DIAGNOSIS — M1990 Unspecified osteoarthritis, unspecified site: Secondary | ICD-10-CM | POA: Diagnosis not present

## 2017-09-06 DIAGNOSIS — Z87891 Personal history of nicotine dependence: Secondary | ICD-10-CM | POA: Diagnosis not present

## 2017-09-06 DIAGNOSIS — E785 Hyperlipidemia, unspecified: Secondary | ICD-10-CM | POA: Insufficient documentation

## 2017-09-06 HISTORY — DX: Allergy status to unspecified drugs, medicaments and biological substances: Z88.9

## 2017-09-06 HISTORY — DX: Atherosclerotic heart disease of native coronary artery without angina pectoris: I25.10

## 2017-09-06 HISTORY — PX: COLONOSCOPY WITH PROPOFOL: SHX5780

## 2017-09-06 SURGERY — COLONOSCOPY WITH PROPOFOL
Anesthesia: General

## 2017-09-06 MED ORDER — PROPOFOL 10 MG/ML IV BOLUS
INTRAVENOUS | Status: DC | PRN
  Administered 2017-09-06: 100 mg via INTRAVENOUS

## 2017-09-06 MED ORDER — LIDOCAINE HCL (PF) 2 % IJ SOLN
INTRAMUSCULAR | Status: AC
  Filled 2017-09-06: qty 10

## 2017-09-06 MED ORDER — LIDOCAINE HCL (PF) 1 % IJ SOLN
INTRAMUSCULAR | Status: AC
  Administered 2017-09-06: 0.3 mL via INTRADERMAL
  Filled 2017-09-06: qty 2

## 2017-09-06 MED ORDER — LIDOCAINE HCL (CARDIAC) PF 100 MG/5ML IV SOSY
PREFILLED_SYRINGE | INTRAVENOUS | Status: DC | PRN
  Administered 2017-09-06: 50 mg via INTRAVENOUS

## 2017-09-06 MED ORDER — LIDOCAINE HCL (PF) 1 % IJ SOLN
2.0000 mL | Freq: Once | INTRAMUSCULAR | Status: AC
  Administered 2017-09-06: 0.3 mL via INTRADERMAL

## 2017-09-06 MED ORDER — PROPOFOL 500 MG/50ML IV EMUL
INTRAVENOUS | Status: AC
  Filled 2017-09-06: qty 50

## 2017-09-06 MED ORDER — PROPOFOL 500 MG/50ML IV EMUL
INTRAVENOUS | Status: DC | PRN
  Administered 2017-09-06: 130 ug/kg/min via INTRAVENOUS

## 2017-09-06 MED ORDER — SODIUM CHLORIDE 0.9 % IV SOLN: INTRAVENOUS | Status: DC

## 2017-09-06 MED ORDER — SODIUM CHLORIDE 0.9 % IV SOLN
INTRAVENOUS | Status: DC
  Administered 2017-09-06: 1000 mL via INTRAVENOUS

## 2017-09-06 NOTE — Op Note (Signed)
[facility] Gastroenterology Patient Name: Zachary Trevino Procedure Date: 09/06/2017 12:45 PM MRN: [id number] Account #: [id number] Date of Birth: [birth year] Admit Type: Outpatient Age: 72 Room: [facility] ENDO ROOM 3 Gender: Male Note Status: Finalized Procedure:            Colonoscopy Indications:          Personal history of colonic polyps Providers:            Lollie Sails, MD Referring MD:         Irven Easterly. Kary Kos, MD (Referring MD) Medicines:            Monitored Anesthesia Care Complications:        No immediate complications. Procedure:            Pre-Anesthesia Assessment:                       - ASA Grade Assessment: III - A patient with severe                        systemic disease.                       After obtaining informed consent, the colonoscope was                        passed under direct vision. Throughout the procedure,                        the patient's blood pressure, pulse, and oxygen                        saturations were monitored continuously. The                        Colonoscope was introduced through the anus and                        advanced to the the cecum, identified by appendiceal                        orifice and ileocecal valve. The colonoscopy was                        performed without difficulty. The patient tolerated the                        procedure well. The quality of the bowel preparation                        was fair. Findings:      Two sessile polyps were found in the transverse colon. The polyps were 2       to 4 mm in size. These polyps were removed with a cold biopsy forceps.       Resection and retrieval were complete.      A 3 mm polyp was found in the hepatic flexure. The polyp was sessile.       The polyp was removed with a cold biopsy forceps. Resection and       retrieval were complete.      A 5 mm polyp was found in the recto-sigmoid colon. The  polyp was       sessile. The polyp was  removed with a cold snare. Resection and       retrieval were complete.      The retroflexed view of the distal rectum and anal verge was normal and       showed no anal or rectal abnormalities.      The digital rectal exam was normal. Impression:           - Preparation of the colon was fair.                       - Two 2 to 4 mm polyps in the transverse colon, removed                        with a cold biopsy forceps. Resected and retrieved.                       - One 3 mm polyp at the hepatic flexure, removed with a                        cold biopsy forceps. Resected and retrieved.                       - One 5 mm polyp at the recto-sigmoid colon, removed                        with a cold snare. Resected and retrieved.                       - The distal rectum and anal verge are normal on                        retroflexion view. Recommendation:       - Discharge patient to home.                       - Telephone GI clinic for pathology results in 1 week. Procedure Code(s):    --- Professional ---                       [phone], Colonoscopy, flexible; with removal of tumor(s),                        polyp(s), or other lesion(s) by snare technique                       [id number], 53, Colonoscopy, flexible; with biopsy, single                        or multiple Diagnosis Code(s):    --- Professional ---                       D12.3, Benign neoplasm of transverse colon (hepatic                        flexure or splenic flexure)                       D12.7, Benign neoplasm of rectosigmoid junction  Z86.010, Personal history of colonic polyps CPT copyright 2017 [employer]. All rights reserved. The codes documented in this report are preliminary and upon coder review may  be revised to meet current compliance requirements. Lollie Sails, MD 09/06/2017 1:28:39 PM This report has been signed electronically. Number of Addenda: 0 Note Initiated On:  09/06/2017 12:45 PM Scope Withdrawal Time: 0 hours 12 minutes 28 seconds  Total Procedure Duration: 0 hours 31 minutes 34 seconds       [facility]

## 2017-09-06 NOTE — Anesthesia Preprocedure Evaluation (Signed)
Anesthesia Evaluation  Patient identified by MRN, date of birth, ID band Patient awake    Reviewed: Allergy & Precautions, NPO status , Patient's Chart, lab work & pertinent test results, reviewed documented beta blocker date and time   Airway Mallampati: II  TM Distance: >3 FB     Dental   Pulmonary COPD, former smoker,    Pulmonary exam normal        Cardiovascular hypertension, Pt. on medications and Pt. on home beta blockers + CAD  Normal cardiovascular exam     Neuro/Psych negative neurological ROS  negative psych ROS   GI/Hepatic Neg liver ROS, Hx of colon polyps    Endo/Other  diabetes, Well Controlled, Type 2  Renal/GU negative Renal ROS  negative genitourinary   Musculoskeletal  (+) Arthritis , Osteoarthritis,    Abdominal Normal abdominal exam  (+)   Peds negative pediatric ROS (+)  Hematology   Anesthesia Other Findings Past Medical History: No date: Arthritis No date: COPD (chronic obstructive pulmonary disease) (HCC) No date: Coronary artery disease No date: Diabetes mellitus without complication (HCC)     Comment:  diet controlled No date: Hemorrhoids No date: History of colon polyps No date: Hx of seasonal allergies No date: Hyperlipidemia No date: Hypertension  Reproductive/Obstetrics                             Anesthesia Physical Anesthesia Plan  ASA: III  Anesthesia Plan: General   Post-op Pain Management:    Induction: Intravenous  PONV Risk Score and Plan:   Airway Management Planned: Nasal Cannula  Additional Equipment:   Intra-op Plan:   Post-operative Plan:   Informed Consent: I have reviewed the patients History and Physical, chart, labs and discussed the procedure including the risks, benefits and alternatives for the proposed anesthesia with the patient or authorized representative who has indicated his/her understanding and acceptance.    Dental advisory given  Plan Discussed with: CRNA and Surgeon  Anesthesia Plan Comments:         Anesthesia Quick Evaluation

## 2017-09-06 NOTE — Transfer of Care (Signed)
Immediate Anesthesia Transfer of Care Note  Patient: Zachary WINDERS Sr.  Procedure(s) Performed: COLONOSCOPY WITH PROPOFOL (N/A )  Patient Location: PACU and Endoscopy Unit  Anesthesia Type:General  Level of Consciousness: drowsy  Airway & Oxygen Therapy: Patient Spontanous Breathing and Patient connected to nasal cannula oxygen  Post-op Assessment: Report given to RN and Post -op Vital signs reviewed and stable  Post vital signs: Reviewed and stable  Last Vitals:  Vitals Value Taken Time  BP    Temp    Pulse 86 09/06/2017  1:29 PM  Resp 14 09/06/2017  1:29 PM  SpO2 95 % 09/06/2017  1:29 PM  Vitals shown include unvalidated device data.  Last Pain:  Vitals:   09/06/17 1143  TempSrc: Tympanic  PainSc: 0-No pain         Complications: No apparent anesthesia complications

## 2017-09-06 NOTE — H&P (Signed)
Outpatient short stay form Pre-procedure 09/06/2017 12:29 PM Lollie Sails MD  Primary Physician: [place] Pink, MD  Reason for visit: Colonoscopy  History of present illness: Patient is a 72 year old male presenting today as above.  He has personal history of adenomatous colon polyps with his last colonoscopy being 5 years ago.  Tolerated his prep well.  He takes no aspirin or blood thinning agent with the exception of 81 mg aspirin that he is held for couple of days.      Current Facility-Administered Medications:  .  0.9 %  sodium chloride infusion, , Intravenous, Continuous, Lollie Sails, MD .  0.9 %  sodium chloride infusion, , Intravenous, Continuous, Lollie Sails, MD, Last Rate: 20 mL/hr at 09/06/17 1207, 1,000 mL at 09/06/17 1207  Medications Prior to Admission  Medication Sig Dispense Refill Last Dose  . albuterol (PROVENTIL HFA;VENTOLIN HFA) 108 (90 Base) MCG/ACT inhaler Inhale 2 puffs into the lungs every 4 (four) hours as needed for wheezing or shortness of breath.   09/03/2017  . ipratropium-albuterol (DUONEB) 0.5-2.5 (3) MG/3ML SOLN Take 3 mLs by nebulization every 4 (four) hours as needed.   09/03/2017  . losartan (COZAAR) 100 MG tablet Take 100 mg by mouth daily.     Marland Kitchen lovastatin (MEVACOR) 40 MG tablet Take 40 mg by mouth at bedtime.     . saccharomyces boulardii (FLORASTOR) 250 MG capsule Take 250 mg by mouth daily.     . Alpha-Lipoic Acid 200 MG CAPS Take 1 capsule by mouth daily.   Taking  . amLODipine (NORVASC) 5 MG tablet Take 5 mg by mouth daily.    09/06/2017 at 0700  . aspirin EC 81 MG tablet Take 81 mg by mouth daily.   09/03/2017  . Cinnamon 500 MG TABS Take 1 each by mouth daily.   Taking  . COENZYME Q-10 PO Take 1 capsule by mouth daily.   Taking  . Fluticasone-Salmeterol (ADVAIR) 100-50 MCG/DOSE AEPB Inhale 1 puff into the lungs 2 (two) times daily.   09/06/2017 at 0700  . hydrocortisone (ANUSOL-HC) 25 MG suppository    Taking  .  hydrocortisone-pramoxine (ANALPRAM-HC) 2.5-1 % rectal cream PLACE 1 APPLICATION RECTALLY 3 (THREE) TIMES DAILY. 30 g 1   . lisinopril (PRINIVIL,ZESTRIL) 10 MG tablet Take 10 mg by mouth daily.   08/30/2017 at 0700  . meloxicam (MOBIC) 7.5 MG tablet Take 7.5 mg by mouth daily.   Taking  . metoprolol tartrate (LOPRESSOR) 25 MG tablet Take 12.5 mg by mouth 2 (two) times daily.    09/06/2017 at 0700  . Nutritional Supplements (OSTEO ADVANCE) TABS Take 2 tablets by mouth daily.   Taking  . Omega-3 Fatty Acids (FISH OIL PO) Take 1 capsule by mouth daily.   Taking  . omeprazole (PRILOSEC) 40 MG capsule Take 40 mg by mouth daily.    Taking  . simvastatin (ZOCOR) 40 MG tablet Take 40 mg by mouth daily.   Taking     No Known Allergies   Past Medical History:  Diagnosis Date  . Arthritis   . COPD (chronic obstructive pulmonary disease) ([place])   . Coronary artery disease   . Diabetes mellitus without complication (HCC)    diet controlled  . Hemorrhoids   . History of colon polyps   . Hx of seasonal allergies   . Hyperlipidemia   . Hypertension     Review of systems:      Physical Exam    Heart and lungs: Regular rate  and rhythm without rub or gallop, lungs are bilaterally clear.    HEENT: Normocephalic atraumatic eyes are anicteric    Other:    Pertinant exam for procedure: Soft nontender nondistended bowel sounds positive normoactive.    Planned proceedures: Colonoscopy and indicated procedures. I have discussed the risks benefits and complications of procedures to include not limited to bleeding, infection, perforation and the risk of sedation and the patient wishes to proceed.    Lollie Sails, MD Gastroenterology 09/06/2017  12:29 PM

## 2017-09-06 NOTE — Anesthesia Post-op Follow-up Note (Signed)
Anesthesia QCDR form completed.        

## 2017-09-07 ENCOUNTER — Encounter: Payer: Self-pay | Admitting: Gastroenterology

## 2017-09-07 LAB — SURGICAL PATHOLOGY

## 2017-09-07 NOTE — Anesthesia Postprocedure Evaluation (Signed)
Anesthesia Post Note  Patient: Zachary ARMBRUST Sr.  Procedure(s) Performed: COLONOSCOPY WITH PROPOFOL (N/A )  Patient location during evaluation: PACU Anesthesia Type: General Level of consciousness: awake and alert and oriented Pain management: pain level controlled Vital Signs Assessment: post-procedure vital signs reviewed and stable Respiratory status: spontaneous breathing Cardiovascular status: blood pressure returned to baseline Anesthetic complications: no     Last Vitals:  Vitals:   09/06/17 1330 09/06/17 1340  BP: 103/64   Pulse:    Resp:  16  Temp: (!) 36.1 C   SpO2:      Last Pain:  Vitals:   09/07/17 0744  TempSrc:   PainSc: 0-No pain                 Kenric Ginger

## 2017-09-09 ENCOUNTER — Encounter: Payer: Self-pay | Admitting: Gynecology

## 2017-09-09 ENCOUNTER — Ambulatory Visit (INDEPENDENT_AMBULATORY_CARE_PROVIDER_SITE_OTHER): Payer: Managed Care, Other (non HMO)

## 2017-09-09 ENCOUNTER — Ambulatory Visit
Admission: EM | Admit: 2017-09-09 | Discharge: 2017-09-09 | Disposition: A | Discharge status: Home / Self Care | Payer: Managed Care, Other (non HMO) | Attending: Family Medicine | Admitting: Family Medicine

## 2017-09-09 DIAGNOSIS — R5383 Other fatigue: Secondary | ICD-10-CM

## 2017-09-09 DIAGNOSIS — R05 Cough: Secondary | ICD-10-CM | POA: Diagnosis not present

## 2017-09-09 DIAGNOSIS — J029 Acute pharyngitis, unspecified: Secondary | ICD-10-CM

## 2017-09-09 DIAGNOSIS — J441 Chronic obstructive pulmonary disease with (acute) exacerbation: Secondary | ICD-10-CM | POA: Diagnosis not present

## 2017-09-09 DIAGNOSIS — J069 Acute upper respiratory infection, unspecified: Secondary | ICD-10-CM

## 2017-09-09 LAB — RAPID STREP SCREEN (MED CTR MEBANE ONLY): STREPTOCOCCUS, GROUP A SCREEN (DIRECT): NEGATIVE

## 2017-09-09 IMAGING — CR DG CHEST 2V
2 series · 2 of 2 positions shown · non-contrast
Comparison: [DATE]

CLINICAL DATA: Productive cough for 1 week

EXAM:
CHEST - 2 VIEW

[chest pa]
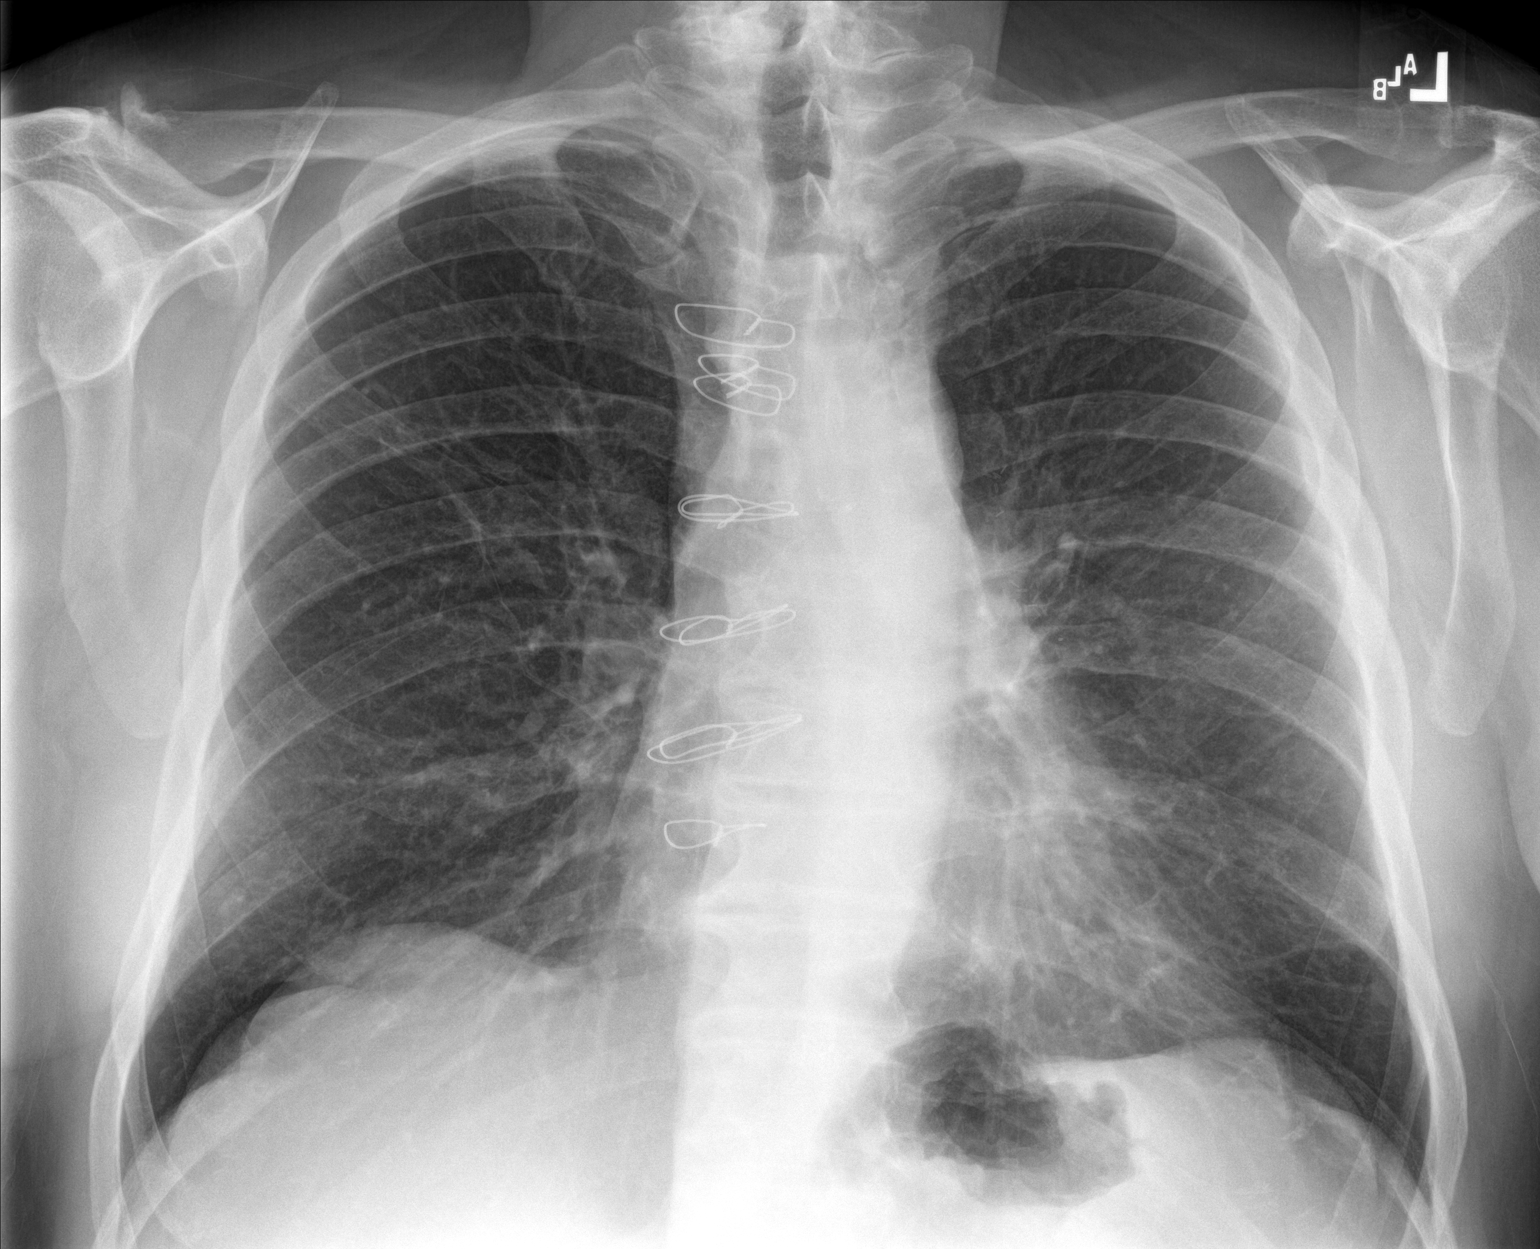

[chest lat]
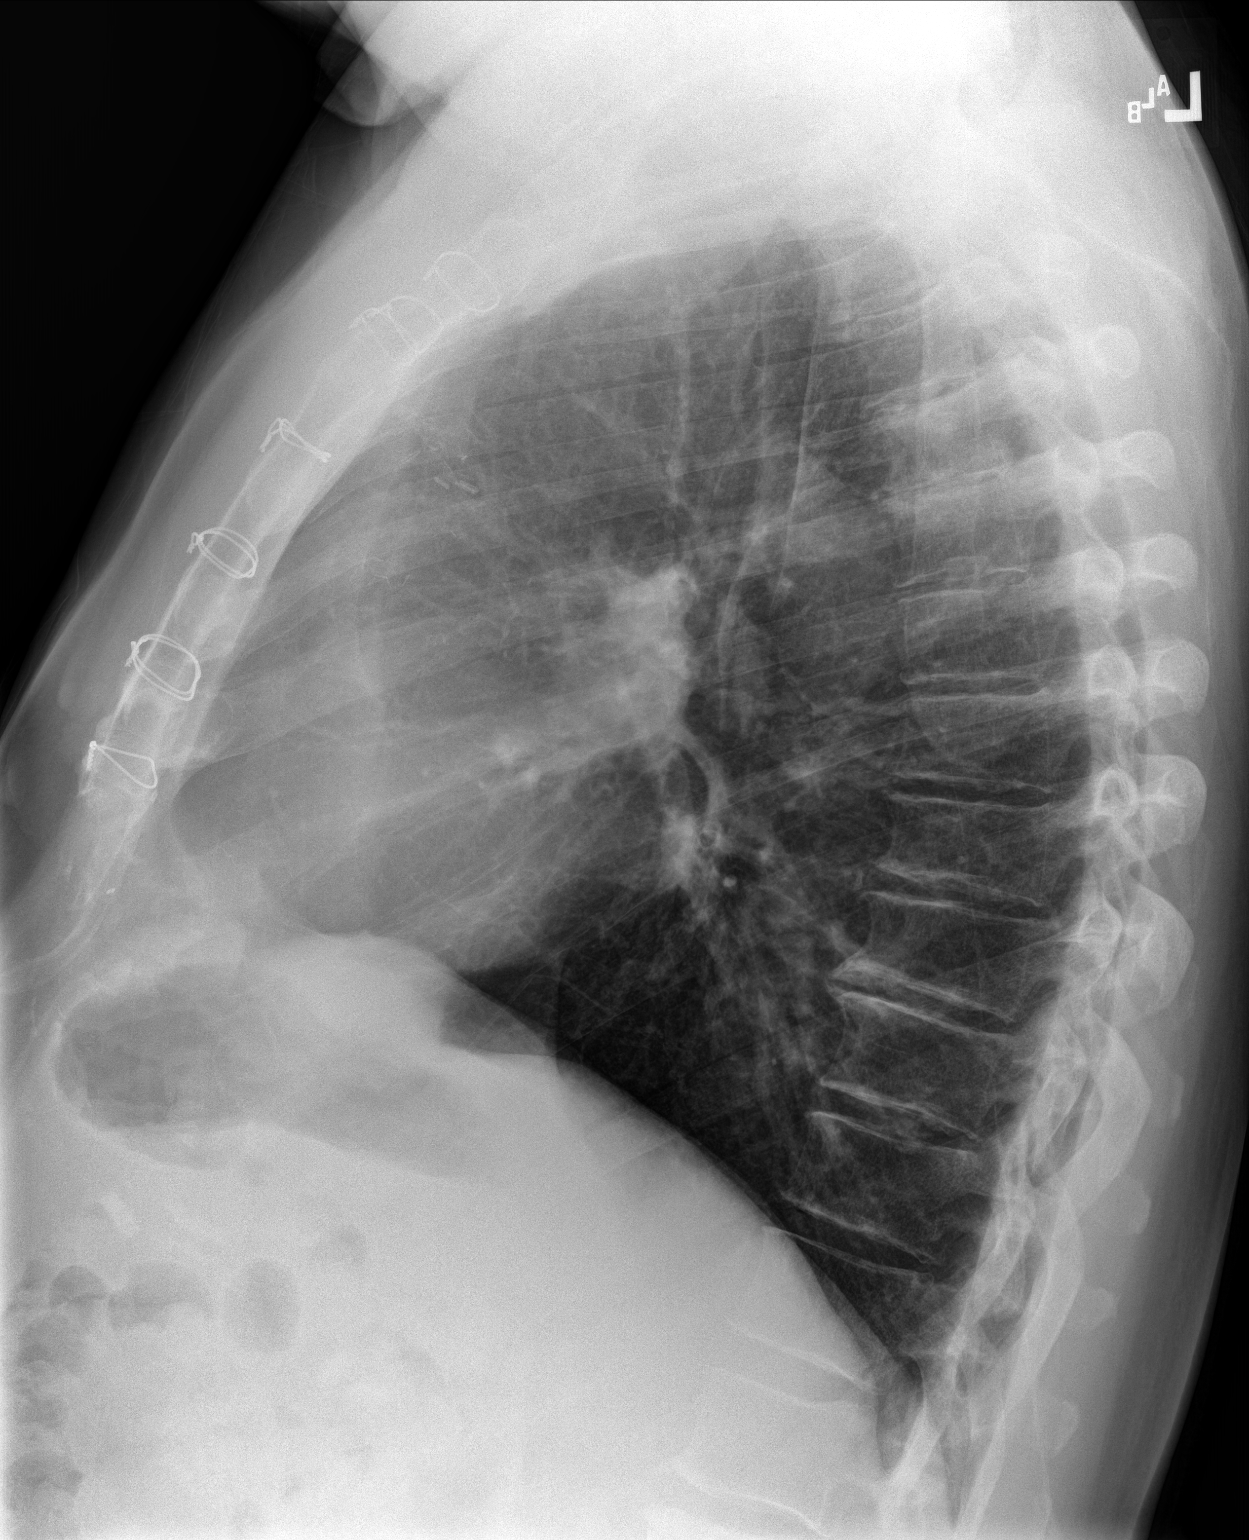

[2 of 2 positions shown; findings below may reference images not displayed]

FINDINGS: Cardiac shadow is within normal limits. Postsurgical changes are
again noted. The lungs are well aerated bilaterally. No focal
infiltrate or sizable effusion is seen. Mild degenerative changes of
thoracic spine are noted.
IMPRESSION: No active cardiopulmonary disease.

## 2017-09-09 MED ORDER — DOXYCYCLINE HYCLATE 100 MG PO CAPS: 100.0000 mg | ORAL_CAPSULE | Freq: Two times a day (BID) | ORAL | 0 refills | Status: DC

## 2017-09-09 NOTE — ED Provider Notes (Signed)
[facility]    CSN: [id number] Arrival date & time: 09/09/17  1020     History   Chief Complaint Chief Complaint  Patient presents with  . Cough  . Sore Throat    HPI Zachary BERTHELOT Sr. is a 72 y.o. male.   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  States he has fatigue and has been coughing up sputum. He has been using nebulized albuterol and aerosolized Atrovent and albuterol at home.  He has not had  wheezing but has complained of shortness of breath.  He is also has a sore throat which is much worse in the mornings.  Tell me that they have 15 cats in the house 4 which  stay in house all the time the others are  outside cats most of the time.  He states that he feels that he is sensitive to the cats and has a designated room for himself where no cats go, but his wife argues that it is not cats.  Comorbidities include hypertension diabetes hyperlipidemia CAD          Past Medical History:  Diagnosis Date  . Arthritis   . COPD (chronic obstructive pulmonary disease) ([place])   . Coronary artery disease   . Diabetes mellitus without complication (HCC)    diet controlled  . Hemorrhoids   . History of colon polyps   . Hx of seasonal allergies   . Hyperlipidemia   . Hypertension     There are no active problems to display for this patient.   Past Surgical History:  Procedure Laterality Date  . COLONOSCOPY WITH ESOPHAGOGASTRODUODENOSCOPY (EGD)    . COLONOSCOPY WITH PROPOFOL N/A 09/06/2017   Procedure: COLONOSCOPY WITH PROPOFOL;  Surgeon: Lollie Sails, MD;  Location: [facility] ENDOSCOPY;  Service: Endoscopy;  Laterality: N/A;  . CORONARY ARTERY BYPASS GRAFT    . HEMORRHOID SURGERY  20 years ago   twice  . VEIN BYPASS SURGERY  04/2008   3 vessel with vein graft and stent placement       Home Medications    Prior to Admission medications   Medication Sig Start  Date End Date Taking? Authorizing Provider  albuterol (PROVENTIL HFA;VENTOLIN HFA) 108 (90 Base) MCG/ACT inhaler Inhale 2 puffs into the lungs every 4 (four) hours as needed for wheezing or shortness of breath.   Yes [provider]  Alpha-Lipoic Acid 200 MG CAPS Take 1 capsule by mouth daily.   Yes [provider]  amLODipine (NORVASC) 5 MG tablet Take 5 mg by mouth daily.  02/24/13  Yes [provider]  aspirin EC 81 MG tablet Take 81 mg by mouth daily.   Yes [provider]  Cinnamon 500 MG TABS Take 1 each by mouth daily.   Yes [provider]  COENZYME Q-10 PO Take 1 capsule by mouth daily.   Yes [provider]  Fluticasone-Salmeterol (ADVAIR) 100-50 MCG/DOSE AEPB Inhale 1 puff into the lungs 2 (two) times daily.   Yes [provider]  hydrocortisone (ANUSOL-HC) 25 MG suppository  02/25/13  Yes [provider]  hydrocortisone-pramoxine (ANALPRAM-HC) 2.5-1 % rectal cream PLACE 1 APPLICATION RECTALLY 3 (THREE) TIMES DAILY.   Yes Sankar, Seeplaputhur G, MD  ipratropium-albuterol (DUONEB) 0.5-2.5 (3) MG/3ML SOLN Take 3 mLs by nebulization every 4 (four) hours as needed.   Yes [provider]  lisinopril (PRINIVIL,ZESTRIL) 10 MG  tablet Take 10 mg by mouth daily.   Yes [provider]  losartan (COZAAR) 100 MG tablet Take 100 mg by mouth daily.   Yes [provider]  lovastatin (MEVACOR) 40 MG tablet Take 40 mg by mouth at bedtime.   Yes [provider]  meloxicam (MOBIC) 7.5 MG tablet Take 7.5 mg by mouth daily.   Yes [provider]  metoprolol tartrate (LOPRESSOR) 25 MG tablet Take 12.5 mg by mouth 2 (two) times daily.  01/30/13  Yes [provider]  Nutritional Supplements (OSTEO ADVANCE) TABS Take 2 tablets by mouth daily.   Yes [provider]  Omega-3 Fatty Acids (FISH OIL PO) Take 1 capsule by mouth daily.   Yes [provider]  omeprazole (PRILOSEC)  40 MG capsule Take 40 mg by mouth daily.  02/17/13  Yes [provider]  saccharomyces boulardii (FLORASTOR) 250 MG capsule Take 250 mg by mouth daily.   Yes [provider]  simvastatin (ZOCOR) 40 MG tablet Take 40 mg by mouth daily.   Yes [provider]  doxycycline (VIBRAMYCIN) 100 MG capsule Take 1 capsule (100 mg total) by mouth 2 (two) times daily. 09/09/17   Lorin Picket, PA-C    Family History Family History  Problem Relation Age of Onset  . Prostate cancer Father     Social History Social History   Tobacco Use  . Smoking status: Former Smoker    Years: 25.00    Types: Cigarettes    Last attempt to quit: 02/16/1978    Years since quitting: 39.5  . Smokeless tobacco: Never Used  Substance Use Topics  . Alcohol use: Yes  . Drug use: No     Allergies   Patient has no known allergies.   Review of Systems Review of Systems  Constitutional: Positive for activity change, chills and fatigue. Negative for fever.  HENT: Positive for congestion, postnasal drip, rhinorrhea and sore throat.   Respiratory: Positive for cough.   All other systems reviewed and are negative.    Physical Exam Triage Vital Signs ED Triage Vitals  Enc Vitals Group     BP 09/09/17 1031 133/89     Pulse Rate 09/09/17 1031 69     Resp 09/09/17 1031 16     Temp 09/09/17 1031 98 F (36.7 C)     Temp Source 09/09/17 1031 Oral     SpO2 09/09/17 1031 96 %     Weight 09/09/17 1032 252 lb (114.3 kg)     Height --      Head Circumference --      Peak Flow --      Pain Score --      Pain Loc --      Pain Edu? --      Excl. in [place]? --    No data found.  Updated Vital Signs BP 133/89 (BP Location: Left Arm)   Pulse 69   Temp 98 F (36.7 C) (Oral)   Resp 16   Wt 252 lb (114.3 kg)   SpO2 96%   BMI 34.18 kg/m   Visual Acuity Right Eye Distance:   Left Eye Distance:   Bilateral Distance:    Right Eye Near:   Left Eye Near:    Bilateral Near:     Physical  Exam  Constitutional: He is oriented to person, place, and time. He appears well-developed and well-nourished.  Non-toxic appearance. He does not appear ill. No distress.  HENT:  Head:  Normocephalic.  Right Ear: Hearing, tympanic membrane and ear canal normal.  Left Ear: Hearing, tympanic membrane and ear canal normal.  Mouth/Throat: Oropharynx is clear and moist and mucous membranes are normal.  Eyes: Pupils are equal, round, and reactive to light.  Neck: Normal range of motion.  Pulmonary/Chest: Effort normal.  DeCreased of breath sounds in the right lower lobe. No Significant crackles rhonchi or wheezing.  Lymphadenopathy:    He has no cervical adenopathy.  Neurological: He is alert and oriented to person, place, and time.  Skin: Skin is warm and dry.  Psychiatric: He has a normal mood and affect. His behavior is normal.  Nursing note and vitals reviewed.    UC Treatments / Results  Labs (all labs ordered are listed, but only abnormal results are displayed) Labs Reviewed  RAPID STREP SCREEN ([facility] ONLY)  CULTURE, GROUP A STREP [facility])    EKG None  Radiology Dg Chest 2 View  Result Date: 09/09/2017 CLINICAL DATA:  Productive cough for 1 week EXAM: CHEST - 2 VIEW COMPARISON:  11/19/2013 FINDINGS: Cardiac shadow is within normal limits. Postsurgical changes are again noted. The lungs are well aerated bilaterally. No focal infiltrate or sizable effusion is seen. Mild degenerative changes of thoracic spine are noted. IMPRESSION: No active cardiopulmonary disease. Electronically Signed   By: Inez Catalina M.D.   On: 09/09/2017 11:27    Procedures Procedures (including critical care time)  Medications Ordered in UC Medications - No data to display  Initial Impression / Assessment and Plan / UC Course  I have reviewed the triage vital signs and the nursing notes.  Pertinent labs & imaging results that were available during my care of the patient were reviewed by  me and considered in my medical decision making (see chart for details).     Plan: 1. Test/x-ray results and diagnosis reviewed with patient 2. rx as per orders; risks, benefits, potential side effects reviewed with patient 3. Recommend supportive treatment with Flonase nasal spray on a daily basis.  Give him a course of doxycycline for his COPD cough.  He using albuterol and at Atrovent inhalers as necessary.  Consider using Zyrtec Allegra or Claritin on a daily basis also the possibility of sensitivity to the cat dander.  Recommend following up with his primary care physician if he is not improving 4. F/u prn if symptoms worsen or don't improve  Final Clinical Impressions(s) / UC Diagnoses   Final diagnoses:  Upper respiratory tract infection, unspecified type  COPD exacerbation [facility])   Discharge Instructions   None    ED Prescriptions    Medication Sig Dispense Auth. Provider   doxycycline (VIBRAMYCIN) 100 MG capsule Take 1 capsule (100 mg total) by mouth 2 (two) times daily. 14 capsule Lorin Picket, PA-C     Controlled Substance Prescriptions [place] Controlled Substance Registry consulted? Not Applicable   Lorin Picket, PA-C 09/09/17 1156

## 2017-09-09 NOTE — ED Triage Notes (Signed)
Per c/o x over 1 week with cough and sore throat.

## 2017-09-12 LAB — CULTURE, GROUP A STREP (THRC)

## 2017-10-26 ENCOUNTER — Other Ambulatory Visit: Payer: Self-pay

## 2017-10-26 ENCOUNTER — Encounter: Payer: Self-pay | Admitting: *Deleted

## 2017-11-04 ENCOUNTER — Ambulatory Visit
Admission: RE | Admit: 2017-11-04 | Discharge: 2017-11-04 | Disposition: A | Discharge status: Home / Self Care | Payer: Managed Care, Other (non HMO) | Source: Ambulatory Visit | Attending: Otolaryngology | Admitting: Otolaryngology

## 2017-11-04 ENCOUNTER — Encounter
Admission: RE | Disposition: A | Discharge status: Home / Self Care | Payer: Self-pay | Source: Ambulatory Visit | Attending: Otolaryngology

## 2017-11-04 ENCOUNTER — Ambulatory Visit: Payer: Managed Care, Other (non HMO) | Admitting: Anesthesiology

## 2017-11-04 DIAGNOSIS — J342 Deviated nasal septum: Secondary | ICD-10-CM | POA: Insufficient documentation

## 2017-11-04 DIAGNOSIS — Z87891 Personal history of nicotine dependence: Secondary | ICD-10-CM | POA: Diagnosis not present

## 2017-11-04 DIAGNOSIS — J343 Hypertrophy of nasal turbinates: Secondary | ICD-10-CM | POA: Diagnosis not present

## 2017-11-04 DIAGNOSIS — J449 Chronic obstructive pulmonary disease, unspecified: Secondary | ICD-10-CM | POA: Diagnosis not present

## 2017-11-04 DIAGNOSIS — E119 Type 2 diabetes mellitus without complications: Secondary | ICD-10-CM | POA: Diagnosis not present

## 2017-11-04 DIAGNOSIS — Z951 Presence of aortocoronary bypass graft: Secondary | ICD-10-CM | POA: Insufficient documentation

## 2017-11-04 DIAGNOSIS — Z79899 Other long term (current) drug therapy: Secondary | ICD-10-CM | POA: Insufficient documentation

## 2017-11-04 DIAGNOSIS — I10 Essential (primary) hypertension: Secondary | ICD-10-CM | POA: Insufficient documentation

## 2017-11-04 DIAGNOSIS — I2510 Atherosclerotic heart disease of native coronary artery without angina pectoris: Secondary | ICD-10-CM | POA: Insufficient documentation

## 2017-11-04 DIAGNOSIS — E7800 Pure hypercholesterolemia, unspecified: Secondary | ICD-10-CM | POA: Diagnosis not present

## 2017-11-04 HISTORY — DX: Gastro-esophageal reflux disease without esophagitis: K21.9

## 2017-11-04 HISTORY — PX: NASAL SEPTOPLASTY W/ TURBINOPLASTY: SHX2070

## 2017-11-04 SURGERY — SEPTOPLASTY, NOSE, WITH NASAL TURBINATE REDUCTION
Anesthesia: General | Site: Nose | Laterality: Bilateral

## 2017-11-04 MED ORDER — EPHEDRINE SULFATE 50 MG/ML IJ SOLN
INTRAMUSCULAR | Status: DC | PRN
  Administered 2017-11-04 (×2): 10 mg via INTRAVENOUS

## 2017-11-04 MED ORDER — PHENYLEPHRINE HCL 0.5 % NA SOLN
NASAL | Status: DC | PRN
  Administered 2017-11-04: 11:00:00 via NASAL

## 2017-11-04 MED ORDER — DEXAMETHASONE SODIUM PHOSPHATE 4 MG/ML IJ SOLN
INTRAMUSCULAR | Status: DC | PRN
  Administered 2017-11-04: 10 mg via INTRAVENOUS

## 2017-11-04 MED ORDER — OXYCODONE HCL 5 MG/5ML PO SOLN: 5.0000 mg | Freq: Once | ORAL | Status: DC | PRN

## 2017-11-04 MED ORDER — LACTATED RINGERS IV SOLN
1000.0000 mL | INTRAVENOUS | Status: DC
  Administered 2017-11-04: 1000 mL via INTRAVENOUS
  Administered 2017-11-04 (×2): via INTRAVENOUS

## 2017-11-04 MED ORDER — OXYCODONE HCL 5 MG PO TABS: 5.0000 mg | ORAL_TABLET | Freq: Once | ORAL | Status: DC | PRN

## 2017-11-04 MED ORDER — ACETAMINOPHEN 325 MG PO TABS
975.0000 mg | ORAL_TABLET | Freq: Once | ORAL | Status: AC
  Administered 2017-11-04: 975 mg via ORAL

## 2017-11-04 MED ORDER — FENTANYL CITRATE (PF) 100 MCG/2ML IJ SOLN
INTRAMUSCULAR | Status: DC | PRN
  Administered 2017-11-04 (×2): 50 ug via INTRAVENOUS

## 2017-11-04 MED ORDER — LIDOCAINE-EPINEPHRINE 1 %-1:100000 IJ SOLN
INTRAMUSCULAR | Status: DC | PRN
  Administered 2017-11-04: 6 mL

## 2017-11-04 MED ORDER — LIDOCAINE HCL (CARDIAC) PF 100 MG/5ML IV SOSY
PREFILLED_SYRINGE | INTRAVENOUS | Status: DC | PRN
  Administered 2017-11-04: 40 mg via INTRAVENOUS

## 2017-11-04 MED ORDER — FENTANYL CITRATE (PF) 100 MCG/2ML IJ SOLN: 25.0000 ug | INTRAMUSCULAR | Status: DC | PRN

## 2017-11-04 MED ORDER — SUCCINYLCHOLINE CHLORIDE 20 MG/ML IJ SOLN
INTRAMUSCULAR | Status: DC | PRN
  Administered 2017-11-04: 80 mg via INTRAVENOUS

## 2017-11-04 MED ORDER — MIDAZOLAM HCL 5 MG/5ML IJ SOLN
INTRAMUSCULAR | Status: DC | PRN
  Administered 2017-11-04: 2 mg via INTRAVENOUS

## 2017-11-04 MED ORDER — DEXTROSE 5 % IV SOLN
2000.0000 mg | Freq: Once | INTRAVENOUS | Status: AC
  Administered 2017-11-04: 2000 mg via INTRAVENOUS

## 2017-11-04 MED ORDER — OXYMETAZOLINE HCL 0.05 % NA SOLN
2.0000 | Freq: Once | NASAL | Status: AC
  Administered 2017-11-04: 2 via NASAL

## 2017-11-04 MED ORDER — PROPOFOL 10 MG/ML IV BOLUS
INTRAVENOUS | Status: DC | PRN
  Administered 2017-11-04: 100 mg via INTRAVENOUS
  Administered 2017-11-04: 40 mg via INTRAVENOUS

## 2017-11-04 MED ORDER — GLYCOPYRROLATE 0.2 MG/ML IJ SOLN
INTRAMUSCULAR | Status: DC | PRN
  Administered 2017-11-04: 0.1 mg via INTRAVENOUS

## 2017-11-04 SURGICAL SUPPLY — 25 items
CANISTER SUCT 1200ML W/VALVE (MISCELLANEOUS) ×3 IMPLANT
COAGULATOR SUCT 8FR VV (MISCELLANEOUS) ×3 IMPLANT
DRAPE HEAD BAR (DRAPES) ×3 IMPLANT
ELECT REM PT RETURN 9FT ADLT (ELECTROSURGICAL) ×3
ELECTRODE REM PT RTRN 9FT ADLT (ELECTROSURGICAL) ×1 IMPLANT
GLOVE PI ULTRA LF STRL 7.5 (GLOVE) ×2 IMPLANT
GLOVE PI ULTRA NON LATEX 7.5 (GLOVE) ×4
KIT TURNOVER KIT A (KITS) ×3 IMPLANT
NEEDLE ANESTHESIA  27G X 3.5 (NEEDLE) ×2
NEEDLE ANESTHESIA 27G X 3.5 (NEEDLE) ×1 IMPLANT
NEEDLE HYPO 27GX1-1/4 (NEEDLE) ×3 IMPLANT
PACK DRAPE NASAL/ENT (PACKS) ×3 IMPLANT
PATTIES SURGICAL .5 X3 (DISPOSABLE) ×3 IMPLANT
SOL ANTI-FOG 6CC FOG-OUT (MISCELLANEOUS) ×1 IMPLANT
SOL FOG-OUT ANTI-FOG 6CC (MISCELLANEOUS) ×2
SPLINT NASAL SEPTAL BLV .50 ST (MISCELLANEOUS) ×3 IMPLANT
STRAP BODY AND KNEE 60X3 (MISCELLANEOUS) ×3 IMPLANT
SUT CHROMIC 3-0 (SUTURE) ×2
SUT CHROMIC 3-0 KS 27XMFL CR (SUTURE) ×1
SUT ETHILON 3-0 KS 30 BLK (SUTURE) ×3 IMPLANT
SUT PLAIN GUT 4-0 (SUTURE) ×3 IMPLANT
SUTURE CHRMC 3-0 KS 27XMFL CR (SUTURE) ×1 IMPLANT
SYR 3ML LL SCALE MARK (SYRINGE) ×3 IMPLANT
TOWEL OR 17X26 4PK STRL BLUE (TOWEL DISPOSABLE) ×3 IMPLANT
WATER STERILE IRR 250ML POUR (IV SOLUTION) ×3 IMPLANT

## 2017-11-04 NOTE — Transfer of Care (Signed)
Immediate Anesthesia Transfer of Care Note  Patient: Zachary SCALLY Sr.  Procedure(s) Performed: NASAL SEPTOPLASTY WITH INFERIOR TURBINATE REDUCTION (Bilateral Nose)  Patient Location: PACU  Anesthesia Type: General  Level of Consciousness: awake, alert  and patient cooperative  Airway and Oxygen Therapy: Patient Spontanous Breathing and Patient connected to supplemental oxygen  Post-op Assessment: Post-op Vital signs reviewed, Patient's Cardiovascular Status Stable, Respiratory Function Stable, Patent Airway and No signs of Nausea or vomiting  Post-op Vital Signs: Reviewed and stable  Complications: No apparent anesthesia complications

## 2017-11-04 NOTE — Discharge Instructions (Signed)
[facility] °[facility] °ENDOSCOPIC SINUS SURGERY °[facility] ° °What is Functional Endoscopic Sinus Surgery? ° The Surgery involves making the natural openings of the sinuses larger by removing the bony partitions that separate the sinuses from the nasal cavity.  The natural sinus lining is preserved as much as possible to allow the sinuses to resume normal function after the surgery.  In some patients nasal polyps (excessively swollen lining of the sinuses) may be removed to relieve obstruction of the sinus openings.  The surgery is performed through the nose using lighted scopes, which eliminates the need for incisions on the face.  A septoplasty is a different procedure which is sometimes performed with sinus surgery.  It involves straightening the boy partition that separates the two sides of your nose.  A crooked or deviated septum may need repair if is obstructing the sinuses or nasal airflow.  Turbinate reduction is also often performed during sinus surgery.  The turbinates are bony proturberances from the side walls of the nose which swell and can obstruct the nose in patients with sinus and allergy problems.  Their size can be surgically reduced to help relieve nasal obstruction. ° °What Can Sinus Surgery Do For Me? ° Sinus surgery can reduce the frequency of sinus infections requiring antibiotic treatment.  This can provide improvement in nasal congestion, post-nasal drainage, facial pressure and nasal obstruction.  Surgery will NOT prevent you from ever having an infection again, so it usually only for patients who get infections 4 or more times yearly requiring antibiotics, or for infections that do not clear with antibiotics.  It will not cure nasal allergies, so patients with allergies may still require medication to treat their allergies after surgery. Surgery may improve headaches related to sinusitis, however, some people will continue to  require medication to control sinus headaches related to allergies.  Surgery will do nothing for other forms of headache (migraine, tension or cluster). ° °What Are the Risks of Endoscopic Sinus Surgery? ° Current techniques allow surgery to be performed safely with little risk, however, there are rare complications that patients should be aware of.  Because the sinuses are located around the eyes, there is risk of eye injury, including blindness, though again, this would be quite rare. This is usually a result of bleeding behind the eye during surgery, which puts the vision oat risk, though there are treatments to protect the vision and prevent permanent disrupted by surgery causing a leak of the spinal fluid that surrounds the brain.  More serious complications would include bleeding inside the brain cavity or damage to the brain.  Again, all of these complications are uncommon, and spinal fluid leaks can be safely managed surgically if they occur.  The most common complication of sinus surgery is bleeding from the nose, which may require packing or cauterization of the nose.  Continued sinus have polyps may experience recurrence of the polyps requiring revision surgery.  Alterations of sense of smell or injury to the tear ducts are also rare complications.  ° °What is the Surgery Like, and what is the Recovery? ° The Surgery usually takes a couple of hours to perform, and is usually performed under a general anesthetic (completely asleep).  Patients are usually discharged home after a couple of hours.  Sometimes during surgery it is necessary to pack the nose to control bleeding, and the packing is left in place for 24 - 48 hours, and removed by your surgeon.    If a septoplasty was performed during the procedure, there is often a splint placed which must be removed after 5-7 days.   °Discomfort: Pain is usually mild to moderate, and can be controlled by prescription pain medication or acetaminophen (Tylenol).   Aspirin, Ibuprofen (Advil, Motrin), or Naprosyn (Aleve) should be avoided, as they can cause increased bleeding.  Most patients feel sinus pressure like they have a bad head cold for several days.  Sleeping with your head elevated can help reduce swelling and facial pressure, as can ice packs over the face.  A humidifier may be helpful to keep the mucous and blood from drying in the nose.  ° °Diet: There are no specific diet restrictions, however, you should generally start with clear liquids and a light diet of bland foods because the anesthetic can cause some nausea.  Advance your diet depending on how your stomach feels.  Taking your pain medication with food will often help reduce stomach upset which pain medications can cause. ° °Nasal Saline Irrigation: It is important to remove blood clots and dried mucous from the nose as it is healing.  This is done by having you irrigate the nose at least 3 - 4 times daily with a salt water solution.  We recommend using NeilMed Sinus Rinse (available at the drug store).  Fill the squeeze bottle with the solution, bend over a sink, and insert the tip of the squeeze bottle into the nose ½ of an inch.  Point the tip of the squeeze bottle towards the inside corner of the eye on the same side your irrigating.  Squeeze the bottle and gently irrigate the nose.  If you bend forward as you do this, most of the fluid will flow back out of the nose, instead of down your throat.   The solution should be warm, near body temperature, when you irrigate.   Each time you irrigate, you should use a full squeeze bottle.  ° °Note that if you are instructed to use Nasal Steroid Sprays at any time after your surgery, irrigate with saline BEFORE using the steroid spray, so you do not wash it all out of the nose. °Another product, Nasal Saline Gel (such as AYR Nasal Saline Gel) can be applied in each nostril 3 - 4 times daily to moisture the nose and reduce scabbing or crusting. ° °Bleeding:   Bloody drainage from the nose can be expected for several days, and patients are instructed to irrigate their nose frequently with salt water to help remove mucous and blood clots.  The drainage may be dark red or brown, though some fresh blood may be seen intermittently, especially after irrigation.  Do not blow you nose, as bleeding may occur. If you must sneeze, keep your mouth open to allow air to escape through your mouth. ° °If heavy bleeding occurs: Irrigate the nose with saline to rinse out clots, then spray the nose 3 - 4 times with Afrin Nasal Decongestant Spray.  The spray will constrict the blood vessels to slow bleeding.  Pinch the lower half of your nose shut to apply pressure, and lay down with your head elevated.  Ice packs over the nose may help as well. If bleeding persists despite these measures, you should notify your doctor.  Do not use the Afrin routinely to control nasal congestion after surgery, as it can result in worsening congestion and may affect healing.  ° ° ° °Activity: Return to work varies among patients. Most patients will be   out of work at least 5 - 7 days to recover.  Patient may return to work after they are off of narcotic pain medication, and feeling well enough to perform the functions of their job.  Patients must avoid heavy lifting (over 10 pounds) or strenuous physical for 2 weeks after surgery, so your employer may need to assign you to light duty, or keep you out of work longer if light duty is not possible.  NOTE: you should not drive, operate dangerous machinery, do any mentally demanding tasks or make any important legal or financial decisions while on narcotic pain medication and recovering from the general anesthetic.  °  °Call Your Doctor Immediately if You Have Any of the Following: °1. Bleeding that you cannot control with the above measures °2. Loss of vision, double vision, bulging of the eye or black eyes. °3. Fever over 101 degrees °4. Neck stiffness with  severe headache, fever, nausea and change in mental state. °You are always encourage to call anytime with concerns, however, please call with requests for pain medication refills during office hours. ° °Office Endoscopy: During follow-up visits your doctor will remove any packing or splints that may have been placed and evaluate and clean your sinuses endoscopically.  Topical anesthetic will be used to make this as comfortable as possible, though you may want to take your pain medication prior to the visit.  How often this will need to be done varies from patient to patient.  After complete recovery from the surgery, you may need follow-up endoscopy from time to time, particularly if there is concern of recurrent infection or nasal polyps. ° ° °General Anesthesia, Adult, Care After °These instructions provide you with information about caring for yourself after your procedure. Your health care provider may also give you more specific instructions. Your treatment has been planned according to current medical practices, but problems sometimes occur. Call your health care provider if you have any problems or questions after your procedure. °What can I expect after the procedure? °After the procedure, it is common to have: °· Vomiting. °· A sore throat. °· Mental slowness. ° °It is common to feel: °· Nauseous. °· Cold or shivery. °· Sleepy. °· Tired. °· Sore or achy, even in parts of your body where you did not have surgery. ° °Follow these instructions at home: °For at least 24 hours after the procedure: °· Do not: °? Participate in activities where you could fall or become injured. °? Drive. °? Use heavy machinery. °? Drink alcohol. °? Take sleeping pills or medicines that cause drowsiness. °? Make important decisions or sign legal documents. °? Take care of children on your own. °· Rest. °Eating and drinking °· If you vomit, drink water, juice, or soup when you can drink without vomiting. °· Drink enough fluid to  keep your urine clear or pale yellow. °· Make sure you have little or no nausea before eating solid foods. °· Follow the diet recommended by your health care provider. °General instructions °· Have a responsible adult stay with you until you are awake and alert. °· Return to your normal activities as told by your health care provider. Ask your health care provider what activities are safe for you. °· Take over-the-counter and prescription medicines only as told by your health care provider. °· If you smoke, do not smoke without supervision. °· Keep all follow-up visits as told by your health care provider. This is important. °Contact a health care provider if: °· You   continue to have nausea or vomiting at home, and medicines are not helpful. °· You cannot drink fluids or start eating again. °· You cannot urinate after 8-12 hours. °· You develop a skin rash. °· You have fever. °· You have increasing redness at the site of your procedure. °Get help right away if: °· You have difficulty breathing. °· You have chest pain. °· You have unexpected bleeding. °· You feel that you are having a life-threatening or urgent problem. °This information is not intended to replace advice given to you by your health care provider. Make sure you discuss any questions you have with your health care provider. °Document Released: 05/11/2000 Document Revised: 07/08/2015 Document Reviewed: 01/17/2015 °Elsevier Interactive Patient Education © 2018 [employer] ° °

## 2017-11-04 NOTE — Anesthesia Procedure Notes (Signed)
Procedure Name: Intubation Date/Time: 11/04/2017 11:20 AM Performed by: Mayme Genta, CRNA Pre-anesthesia Checklist: Patient identified, Emergency Drugs available, Suction available, Patient being monitored and Timeout performed Patient Re-evaluated:Patient Re-evaluated prior to induction Oxygen Delivery Method: Circle system utilized Preoxygenation: Pre-oxygenation with 100% oxygen Induction Type: IV induction Ventilation: Mask ventilation without difficulty Laryngoscope Size: Miller and 3 Grade View: Grade I Tube type: Oral Rae Tube size: 7.5 mm Number of attempts: 1 Placement Confirmation: ETT inserted through vocal cords under direct vision,  positive ETCO2 and breath sounds checked- equal and bilateral Tube secured with: Tape Dental Injury: Teeth and Oropharynx as per pre-operative assessment

## 2017-11-04 NOTE — H&P (Signed)
H&P has been reviewedand patient reevaluated,  and no changes necessary. To be downloaded later.  

## 2017-11-04 NOTE — Anesthesia Preprocedure Evaluation (Addendum)
Anesthesia Evaluation  Patient identified by MRN, date of birth, ID band Patient awake    Reviewed: Allergy & Precautions, NPO status , Patient's Chart, lab work & pertinent test results, reviewed documented beta blocker date and time   Airway Mallampati: II  TM Distance: >3 FB Neck ROM: Full    Dental no notable dental hx.    Pulmonary COPD, former smoker,    Pulmonary exam normal breath sounds clear to auscultation       Cardiovascular hypertension, + CAD and + CABG  Normal cardiovascular exam Rhythm:Regular Rate:Normal - Carotid Bruit Clean bypass coronaries and normal stress test per cardiology notes   Neuro/Psych negative neurological ROS  negative psych ROS   GI/Hepatic Neg liver ROS, GERD  ,  Endo/Other  diabetes  Renal/GU negative Renal ROS     Musculoskeletal  (+) Arthritis ,   Abdominal (+) + obese,   Peds  Hematology negative hematology ROS (+)   Anesthesia Other Findings   Reproductive/Obstetrics                            Anesthesia Physical Anesthesia Plan  ASA: III  Anesthesia Plan: General   Post-op Pain Management:    Induction: Intravenous  PONV Risk Score and Plan:   Airway Management Planned: Natural Airway  Additional Equipment: None  Intra-op Plan:   Post-operative Plan:   Informed Consent: I have reviewed the patients History and Physical, chart, labs and discussed the procedure including the risks, benefits and alternatives for the proposed anesthesia with the patient or authorized representative who has indicated his/her understanding and acceptance.     Plan Discussed with: CRNA, Surgeon and Anesthesiologist  Anesthesia Plan Comments:         Anesthesia Quick Evaluation

## 2017-11-04 NOTE — Op Note (Signed)
11/04/2017  12:28 PM  [id number]   Pre-Op Dx:  Deviated Nasal Septum, Hypertrophic Inferior Turbinates  Post-op Dx: Same  Proc: Nasal Septoplasty, Bilateral Partial Reduction Inferior Turbinates   Surg:  Zachary Trevino Zachary Trevino  Anes:  GOT  EBL: 50 mL  Comp: None  Findings: His anterior cartilage was completely obstructing his left nostril where the cartilaginous plate was buckled into the left side.  His nasal tip pulled way up to the right side from deviation of the nasal bones.  He only has an airway on his right side is right inferior turbinate is markedly enlarged.  You cannot get far enough back in the nose to see his left inferior turbinate or posterior nasal airway.   Procedure: With the patient in a comfortable supine position,  general orotracheal anesthesia was induced without difficulty.     The patient received preoperative Afrin spray for topical decongestion and vasoconstriction.  Intravenous prophylactic antibiotics were administered.  At an appropriate level, the patient was placed in a semi-sitting position.  Nasal vibrissae were trimmed.   1% Xylocaine with 1:100,000 epinephrine, 6 cc's, was infiltrated into the anterior floor of the nose, into the nasal spine region, into the membranous columella, and finally into the submucoperichondrial plane of the septum on both sides.  Several minutes were allowed for this to take effect.  Cottoniod pledgetts soaked in Afrin and 4% Xylocaine were placed into both nasal cavities and left while the patient was prepped and draped in the standard fashion.  The materials were removed from the nose and observed to be intact and correct in number.  The nose was inspected with a headlight and 0 degrees scope with the findings as described above.  A left hemitransfixion incision was sharply executed and carried down to the caudal edge of the quadrangular cartilage. The mucoperichondrium was elelvated along the quadrangular plate back to the  bony-cartilaginous junction on both sides.. The mucoperiostium was then elevated along the ethmoid plate and the vomer. The boney-catilaginous junction was then split with a freer elevator and the mucoperiosteum was elevated on the opposite side. The mucoperiosteum was then elevated along the maxillary crest as needed to expose the crooked bone of the crest.  The maxillary crest was deviated to the left side and part of this was fractured back towards the midline.  Boney spurs of the vomer and maxillary crest were removed with Takahashi forceps.  The cartilaginous plate was trimmed along its posterior and inferior borders of about 4 mm of cartilage to free it up inferiorly, as the cartilage had overgrown and was much longer. Some of the deviated ethmoid plate was then fractured and removed with Takahashi forceps to free up the posterior border of the quadrangular plate and allow it to swing back to the midline. The mucosal flaps were placed back into their anatomic position to allow visualization of the airways. The septum now sat in the midline with an improved airway.  A 3-0 Chromic suture on a Keith needle in used to anchor the inferior septum at the nasal spine with a through and through suture.  Once this was sitting in the midline the attachments of the quadrangular plate to the upper lateral cartilages were freed removed some of the twist in the midportion of the nose.  The mucosal flaps are then sutured together using a through and through whip stitch of 4-0 Plain Gut with a mini-Keith needle.  This was used to close the hemi-transfixion incision as well.   The  inferior turbinates were then inspected. An incision was created along the inferior aspect of the right inferior turbinate with removal of some of the inferior soft tissue and bone. Electrocautery was used to control bleeding in the area. The remaining turbinate was then outfractured to open up the airway further. There was no significant  bleeding noted. The left inferior turbinate was then visualized in the front part of it was relatively narrow because of the very deviated septum.  Posteriorly there is some swelling of the tissue and some of this was cauterized and outfractured posteriorly for trimming of the posterior inferior left turbinate.  The airways were then visualized and showed open passageways on both sides that were significantly improved compared to before surgery. There was no signifcant bleeding. Nasal splints were applied to both sides of the septum using Xomed 0.59mm regular sized splints that were trimmed, and then held in position with a 3-0 Nylon through and through suture.  The patient was turned back over to anesthesia, and awakened, extubated, and taken to the PACU in satisfactory condition.  Dispo:   PACU to home  Plan: Ice, elevation, narcotic analgesia, steroid taper, and prophylactic antibiotics for the duration of indwelling nasal foreign bodies.  We will reevaluate the patient in the office in 6 days and remove the septal splints.  Return to work in 10 days, strenuous activities in two weeks.   Zachary Trevino Zachary Trevino 11/04/2017 12:28 PM

## 2017-11-04 NOTE — Anesthesia Postprocedure Evaluation (Signed)
Anesthesia Post Note  Patient: Zachary DURNIN Sr.  Procedure(s) Performed: NASAL SEPTOPLASTY WITH INFERIOR TURBINATE REDUCTION (Bilateral Nose)  Patient location during evaluation: PACU Anesthesia Type: General Level of consciousness: awake Pain management: pain level controlled Vital Signs Assessment: post-procedure vital signs reviewed and stable Respiratory status: spontaneous breathing Cardiovascular status: blood pressure returned to baseline Postop Assessment: no headache Anesthetic complications: no    Lavonna Monarch

## 2017-11-05 ENCOUNTER — Encounter: Payer: Self-pay | Admitting: Otolaryngology

## 2019-01-15 ENCOUNTER — Other Ambulatory Visit: Payer: Self-pay

## 2019-01-15 ENCOUNTER — Ambulatory Visit
Admission: EM | Admit: 2019-01-15 | Discharge: 2019-01-15 | Disposition: A | Discharge status: Home / Self Care | Payer: Managed Care, Other (non HMO) | Attending: Emergency Medicine | Admitting: Emergency Medicine

## 2019-01-15 ENCOUNTER — Encounter: Payer: Self-pay | Admitting: Emergency Medicine

## 2019-01-15 DIAGNOSIS — R5383 Other fatigue: Secondary | ICD-10-CM | POA: Diagnosis not present

## 2019-01-15 DIAGNOSIS — J441 Chronic obstructive pulmonary disease with (acute) exacerbation: Secondary | ICD-10-CM

## 2019-01-15 DIAGNOSIS — Z87891 Personal history of nicotine dependence: Secondary | ICD-10-CM | POA: Diagnosis not present

## 2019-01-15 DIAGNOSIS — R05 Cough: Secondary | ICD-10-CM | POA: Diagnosis not present

## 2019-01-15 DIAGNOSIS — Z7189 Other specified counseling: Secondary | ICD-10-CM

## 2019-01-15 MED ORDER — DOXYCYCLINE HYCLATE 100 MG PO CAPS: 100.0000 mg | ORAL_CAPSULE | Freq: Two times a day (BID) | ORAL | 0 refills | Status: DC

## 2019-01-15 NOTE — ED Triage Notes (Signed)
Patient c/o cough and fatigue for the past 4 days.  Patient denies fevers.

## 2019-01-15 NOTE — ED Provider Notes (Addendum)
[facility] ____________________________________________  Time seen: Approximately 1:44 PM  I have reviewed the triage vital signs and the nursing notes.   HISTORY  Chief Complaint Cough and Fatigue   HPI Zachary Trevino. is a 73 y.o. male has ongoing history of hypertension, hyperlipidemia and COPD presenting for evaluation of 3 to 4 days of cough and some accompanying fatigue.  States this does feel like his COPD exacerbations.  Patient wife does also report he had and uses Advair inhalers in the last week like he is normally supposed to do and unsure if this contributed to this.  Did hear some wheezing yesterday, none today.  Uses Advair this morning.  Has not uses albuterol inhaler.  Denies any current shortness of breath.  Denies chest pain.  Occasional nasal congestion and postnasal drainage.  Denies sore throat, fevers, vomiting, diarrhea.  Denies known direct sick contacts.  Denies other aggravating leaving factors.  Reports room air oxygenation is normal 93 to 96%.  Reports otherwise doing well.   Past Medical History:  Diagnosis Date  . Arthritis   . COPD (chronic obstructive pulmonary disease) ([place])   . Coronary artery disease   . Diabetes mellitus without complication (HCC)    diet controlled  . GERD (gastroesophageal reflux disease)   . Hemorrhoids   . History of colon polyps   . Hx of seasonal allergies   . Hyperlipidemia   . Hypertension     There are no active problems to display for this patient.   Past Surgical History:  Procedure Laterality Date  . COLONOSCOPY WITH ESOPHAGOGASTRODUODENOSCOPY (EGD)    . COLONOSCOPY WITH PROPOFOL N/A 09/06/2017   Procedure: COLONOSCOPY WITH PROPOFOL;  Surgeon: Lollie Sails, MD;  Location: [facility];  Service: Endoscopy;  Laterality: N/A;  . CORONARY ARTERY BYPASS GRAFT  2010   3 vessel  . HEMORRHOID SURGERY  20 years ago   twice  . NASAL SEPTOPLASTY W/ TURBINOPLASTY Bilateral 11/04/2017   Procedure: NASAL SEPTOPLASTY WITH INFERIOR TURBINATE REDUCTION;  Surgeon: Margaretha Sheffield, MD;  Location: [place];  Service: ENT;  Laterality: Bilateral;  . VEIN BYPASS SURGERY  04/2008   3 vessel with vein graft and stent placement     No current facility-administered medications for this encounter.   Current Outpatient Medications:  .  albuterol (PROVENTIL HFA;VENTOLIN HFA) 108 (90 Base) MCG/ACT inhaler, Inhale 2 puffs into the lungs every 4 (four) hours as needed for wheezing or shortness of breath., Disp: , Rfl:  .  escitalopram (LEXAPRO) 5 MG tablet, Take 5 mg by mouth at bedtime., Disp: , Rfl:  .  Fluticasone-Salmeterol (ADVAIR) 100-50 MCG/DOSE AEPB, Inhale 1 puff into the lungs 2 (two) times daily., Disp: , Rfl:  .  losartan (COZAAR) 100 MG tablet, Take 100 mg by mouth daily., Disp: , Rfl:  .  lovastatin (MEVACOR) 40 MG tablet, Take 40 mg by mouth at bedtime., Disp: , Rfl:  .  meloxicam (MOBIC) 7.5 MG tablet, Take 7.5 mg by mouth daily., Disp: , Rfl:  .  metoprolol tartrate (LOPRESSOR) 25 MG tablet, Take 12.5 mg by mouth 2 (two) times daily. , Disp: , Rfl:  .  Multiple Vitamin (MULTIVITAMIN) tablet, Take 1 tablet by mouth daily., Disp: , Rfl:  .  omeprazole (PRILOSEC) 40 MG capsule, Take 40 mg by mouth daily. , Disp: , Rfl:  .  Turmeric 400 MG CAPS, Take by mouth daily., Disp: , Rfl:  .  Alpha-Lipoic Acid 200 MG CAPS, Take 1 capsule  by mouth daily., Disp: , Rfl:  .  amLODipine (NORVASC) 5 MG tablet, Take 5 mg by mouth daily. , Disp: , Rfl:  .  Cinnamon 500 MG TABS, Take 1 each by mouth daily., Disp: , Rfl:  .  COENZYME Q-10 PO, Take 1 capsule by mouth daily., Disp: , Rfl:  .  doxycycline (VIBRAMYCIN) 100 MG capsule, Take 1 capsule (100 mg total) by mouth 2 (two) times daily., Disp: 20 capsule, Rfl: 0 .  hydrocortisone (ANUSOL-HC) 25 MG suppository, , Disp: , Rfl:  .  hydrocortisone-pramoxine (ANALPRAM-HC) 2.5-1 % rectal cream, PLACE 1 APPLICATION RECTALLY 3 (THREE) TIMES  DAILY. (Patient not taking: PLACE 1 APPLICATION RECTALLY 3 (THREE) TIMES DAILY.), Disp: 30 g, Rfl: 1 .  Icosapent Ethyl (VASCEPA PO), Take by mouth daily., Disp: , Rfl:  .  ipratropium-albuterol (DUONEB) 0.5-2.5 (3) MG/3ML SOLN, Take 3 mLs by nebulization every 4 (four) hours as needed., Disp: , Rfl:  .  saccharomyces boulardii (FLORASTOR) 250 MG capsule, Take 250 mg by mouth daily., Disp: , Rfl:   Allergies Patient has no known allergies.  Family History  Problem Relation Age of Onset  . Prostate cancer Father     Social History Social History   Tobacco Use  . Smoking status: Former Smoker    Years: 25.00    Types: Cigarettes    Quit date: 02/16/1978    Years since quitting: 40.9  . Smokeless tobacco: Never Used  Substance Use Topics  . Alcohol use: Not Currently  . Drug use: No    Review of Systems Constitutional: No fever ENT: No sore throat. Cardiovascular: Denies chest pain. Respiratory: Denies shortness of breath.  As above. Gastrointestinal: No abdominal pain.   Musculoskeletal: Negative for back pain. Skin: Negative for rash.   ____________________________________________   PHYSICAL EXAM:  VITAL SIGNS: ED Triage Vitals  Enc Vitals Group     BP 01/15/19 1239 129/75     Pulse Rate 01/15/19 1239 74     Resp 01/15/19 1239 20     Temp 01/15/19 1239 97.8 F (36.6 C)     Temp Source 01/15/19 1239 Oral     SpO2 01/15/19 1239 93 %     Weight 01/15/19 1236 260 lb (117.9 kg)     Height 01/15/19 1236 5\' 8"  (1.727 m)     Head Circumference --      Peak Flow --      Pain Score 01/15/19 1235 8     Pain Loc --      Pain Edu? --      Excl. in [place]? --     Constitutional: Alert and oriented. Well appearing and in no acute distress. Eyes: Conjunctivae are normal. ENT      Head: Normocephalic and atraumatic.      Nose: No congestion      Mouth/Throat: Mucous membranes are moist.Oropharynx non-erythematous. Cardiovascular: Normal rate, regular rhythm. Grossly  normal heart sounds.  Good peripheral circulation. Respiratory: Normal respiratory effort without tachypnea nor retractions. Breath sounds are clear and equal bilaterally. No wheezes, rales, rhonchi. Musculoskeletal:  Steady gait.  Neurologic:  Normal speech and language. Speech is normal. No gait instability.  Skin:  Skin is warm, dry and intact. No rash noted. Psychiatric: Mood and affect are normal. Speech and behavior are normal. Patient exhibits appropriate insight and judgment   ___________________________________________   LABS (all labs ordered are listed, but only abnormal results are displayed)  Labs Reviewed  NOVEL CORONAVIRUS, NAA (HOSP ORDER, SEND-OUT TO  REF LAB; TAT 18-24 HRS)    INITIAL IMPRESSION / ASSESSMENT AND PLAN / ED COURSE  Pertinent labs & imaging results that were available during my care of the patient were reviewed by me and considered in my medical decision making (see chart for details).  Well-appearing patient.  No acute distress.  Wife at bedside.  COPD history, suspect mild COPD exacerbation.  Will treat with oral doxycycline.  COVID-19 testing completed and advice given.  Discussed use of prednisone, however as patient currently not wheezing patient declines use of prednisone.  Discussed use home Advair and albuterol as needed.  Monitor. Discussed indication, risks and benefits of medications with patient.   Discussed follow up with Primary care physician this week. Discussed follow up and return parameters including no resolution or any worsening concerns. Patient verbalized understanding and agreed to plan.   ____________________________________________   FINAL CLINICAL IMPRESSION(S) / ED DIAGNOSES  Final diagnoses:  COPD exacerbation ([place])  Advice given about COVID-19 virus infection     ED Discharge Orders         Ordered    doxycycline (VIBRAMYCIN) 100 MG capsule  2 times daily     01/15/19 1316           Note: This dictation was  prepared with Dragon dictation along with smaller phrase technology. Any transcriptional errors that result from this process are unintentional.        Marylene Land, NP 01/15/19 1355

## 2019-01-15 NOTE — Discharge Instructions (Signed)
Take medication as prescribed. Rest. Drink plenty of fluids.  ° °Follow up with your primary care physician this week as needed. Return to Urgent care for new or worsening concerns.  ° °

## 2019-01-17 LAB — NOVEL CORONAVIRUS, NAA (HOSP ORDER, SEND-OUT TO REF LAB; TAT 18-24 HRS): SARS-CoV-2, NAA: NOT DETECTED

## 2019-06-19 ENCOUNTER — Other Ambulatory Visit: Payer: Self-pay | Admitting: Orthopedic Surgery

## 2019-06-19 DIAGNOSIS — M5030 Other cervical disc degeneration, unspecified cervical region: Secondary | ICD-10-CM

## 2019-06-19 DIAGNOSIS — M4802 Spinal stenosis, cervical region: Secondary | ICD-10-CM

## 2019-06-19 DIAGNOSIS — M5412 Radiculopathy, cervical region: Secondary | ICD-10-CM

## 2019-07-01 ENCOUNTER — Ambulatory Visit
Admission: RE | Admit: 2019-07-01 | Discharge: 2019-07-01 | Disposition: A | Discharge status: Home / Self Care | Payer: Managed Care, Other (non HMO) | Source: Ambulatory Visit | Attending: Orthopedic Surgery | Admitting: Orthopedic Surgery

## 2019-07-01 DIAGNOSIS — M4802 Spinal stenosis, cervical region: Secondary | ICD-10-CM | POA: Diagnosis present

## 2019-07-01 DIAGNOSIS — M5030 Other cervical disc degeneration, unspecified cervical region: Secondary | ICD-10-CM

## 2019-07-01 DIAGNOSIS — M5412 Radiculopathy, cervical region: Secondary | ICD-10-CM | POA: Diagnosis not present

## 2019-07-01 IMAGING — MR MR CERVICAL SPINE W/O CM
5 series · 36 of 48 positions shown · non-contrast
Comparison: None.

CLINICAL DATA: Left arm tingling over the last few months.

EXAM:
MRI CERVICAL SPINE WITHOUT CONTRAST
TECHNIQUE: Multiplanar, multisequence MR imaging of the cervical spine was
performed. No intravenous contrast was administered.

[Series 5: T2 · sagittal · 3.0mm · 0.62mm/px · 7 of 15 slices shown (1 of 2)]
[im 1/15]
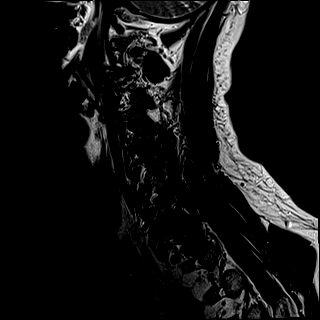
[im 3/15]
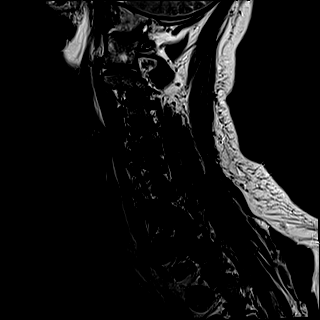
[im 5/15]
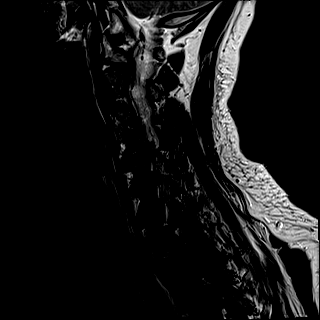
[im 8/15]
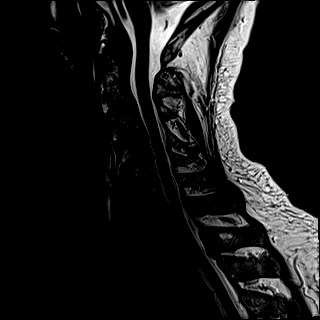
[im 10/15]
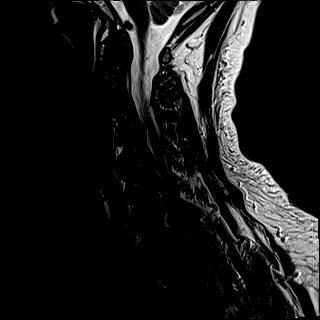
[im 12/15]
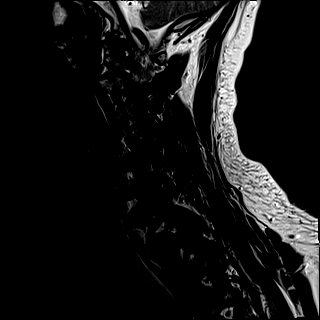
[im 15/15]
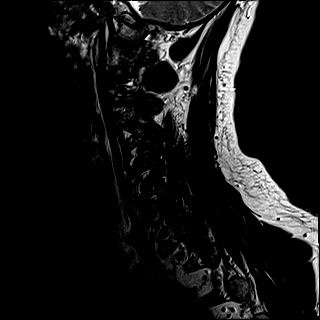

[Series 6: FLAIR · sagittal · 3.0mm · 0.78mm/px · 7 of 15 slices shown]
[im 1/15]
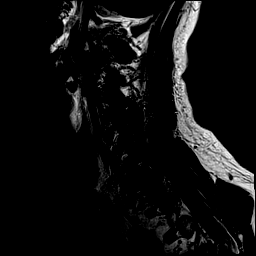
[im 3/15]
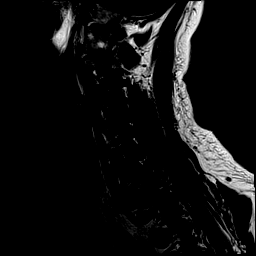
[im 5/15]
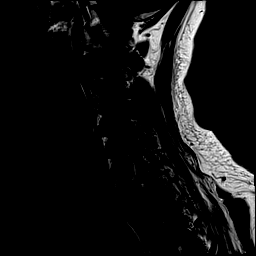
[im 8/15]
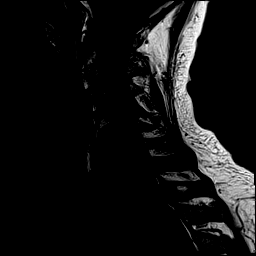
[im 10/15]
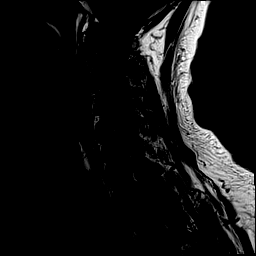
[im 12/15]
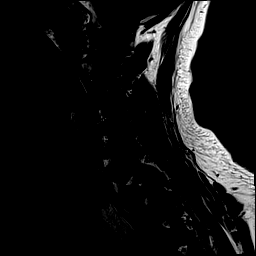
[im 15/15]
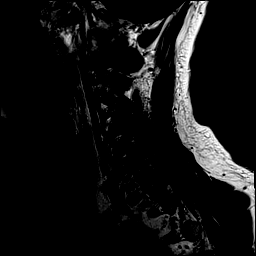

[Series 7: STIR · sagittal · 3.0mm · 0.62mm/px · 6 of 15 slices shown]
[im 1/15]
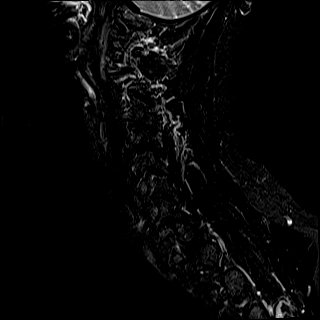
[im 3/15]
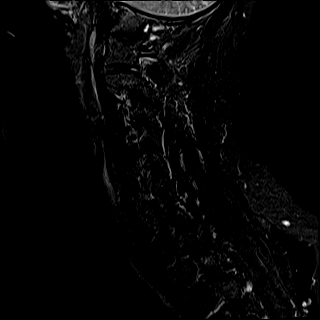
[im 6/15]
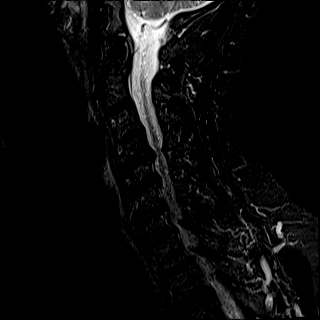
[im 9/15]
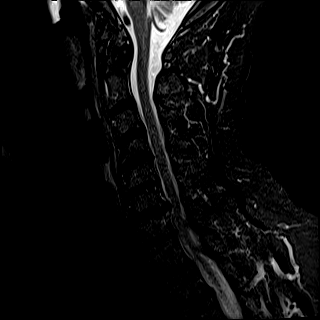
[im 12/15]
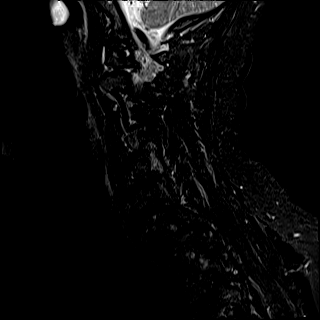
[im 15/15]
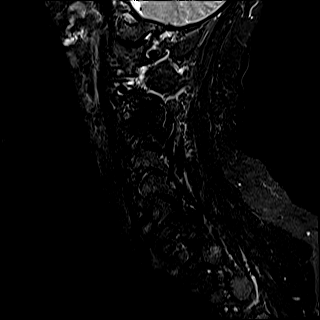

[Series 8: T2 · axial · 3.0mm · 0.70mm/px · z∈[-145,-38]mm · 8 of 33 slices shown (2 of 2)]
[im 1/33]
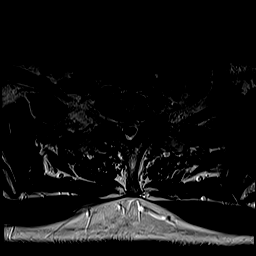
[im 5/33]
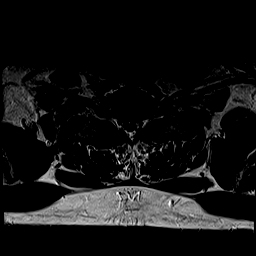
[im 10/33]
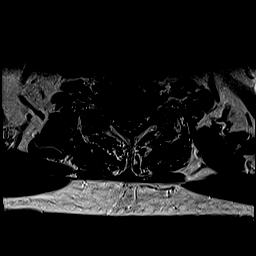
[im 15/33]
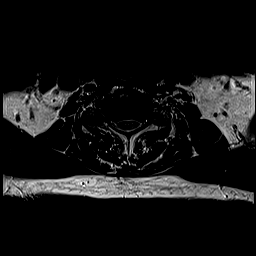
[im 18/33]
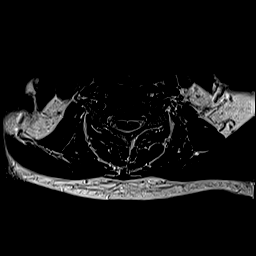
[im 23/33]
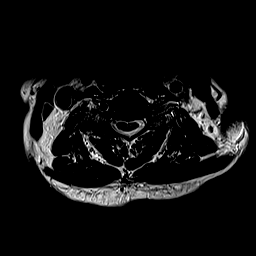
[im 28/33]
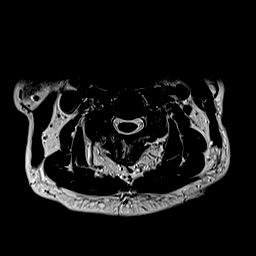
[im 33/33]
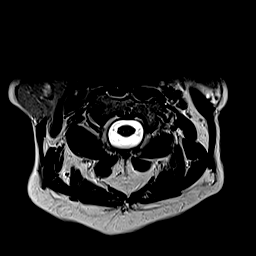

[Series 9: ax mpgr · axial · 3.0mm · 0.35mm/px · z∈[-145,-38]mm · 8 of 33 slices shown]
[im 1/33]
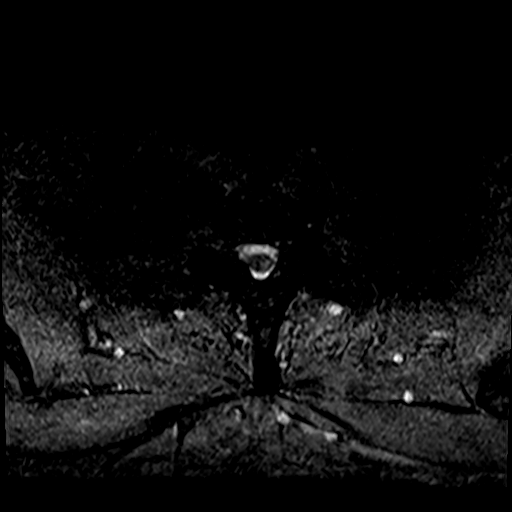
[im 5/33]
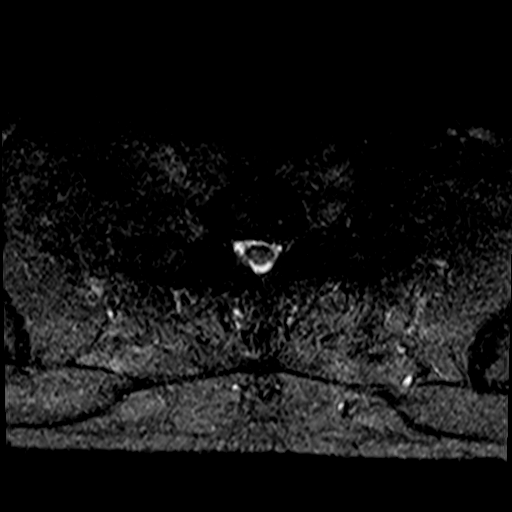
[im 10/33]
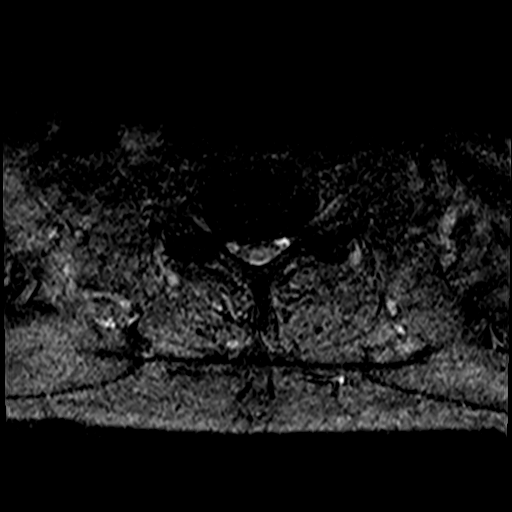
[im 15/33]
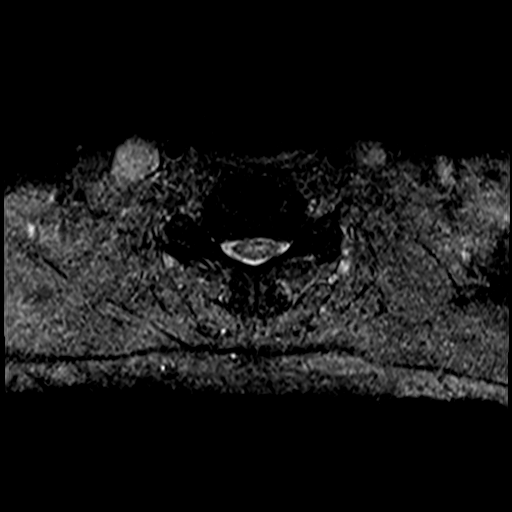
[im 18/33]
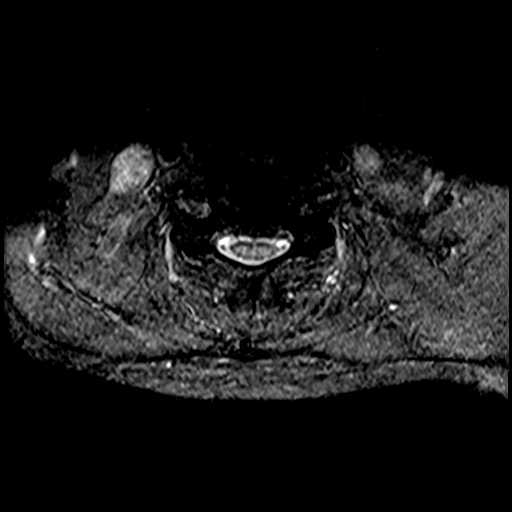
[im 23/33]
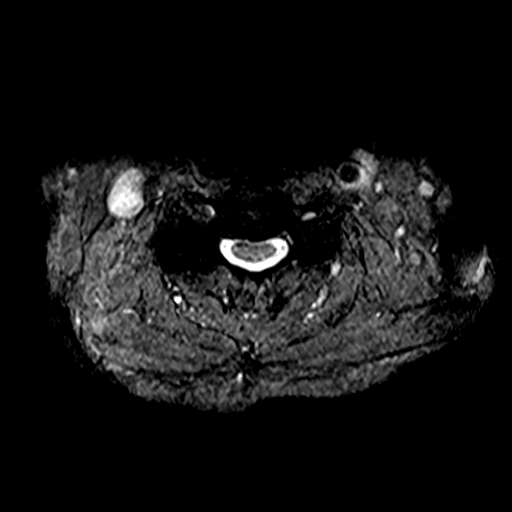
[im 28/33]
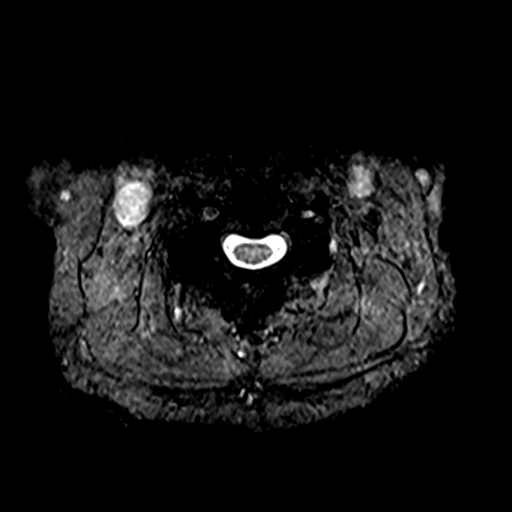
[im 33/33]
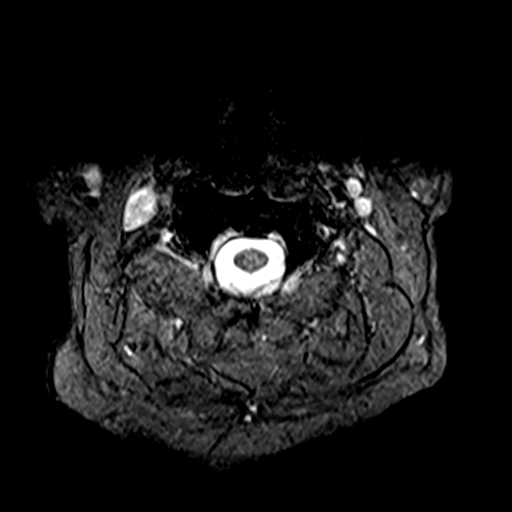

[36 of 48 positions shown; findings below may reference images not displayed]

FINDINGS: Alignment: Straightening of the normal cervical lordosis. 1 mm of
degenerative anterolisthesis at C7-T1. 2 mm degenerative
anterolisthesis T1-2.

Vertebrae: No fracture or primary bone lesion.

Cord: No cord compression or primary cord lesion.

Posterior Fossa, vertebral arteries, paraspinal tissues: Negative

Disc levels:

Foramen magnum is widely patent. Ordinary osteoarthritis at the C1-2
articulation without encroachment upon the neural structures.

C2-3: Facet osteoarthritis worse on the right. No canal stenosis.
Right foraminal narrowing that could affect the right C3 nerve.

C3-4: Endplate osteophytes and mild bulging of the disc. No
compressive canal narrowing. Facet osteoarthritis on the right. Mild
to moderate bilateral foraminal narrowing.

C4-5: Spondylosis with endplate osteophytes and bulging of the disc.
Narrowing of the canal with AP diameter in the midline of 8.5 mm. No
cord compression. Uncovertebral hypertrophy worse on the left. Left
foraminal stenosis that could compress the left C5 nerve.

C5-6: Bilateral uncovertebral prominence. Canal narrowing with AP
diameter in the midline measuring 9 mm. Bilateral bony foraminal
narrowing that could be symptomatic.

C6-7: Endplate osteophytes and bulging of the disc more prominent
towards the right. Narrowing of the canal with AP diameter in the
midline measuring 8.5 mm. Bilateral foraminal stenosis right worse
than left. Either C7 nerve could be affected, more likely the right.

C7-T1: Endplate osteophytes and shallow disc protrusion towards the
left. No apparent compressive effect upon the cord. Mild foraminal
narrowing on the left without definite compression of the exiting C8
nerve. Facet osteoarthritis at this level with 1 mm of
anterolisthesis.

The upper thoracic region shows facet osteoarthritis with 2 mm of
anterolisthesis at T1-2. No canal stenosis. Left worse than right
foraminal stenosis at T1-2.
IMPRESSION: Multilevel degenerative spondylosis. Central canal narrowing but
without cord compression. Foraminal stenosis that could possibly
cause neural compression on the right at C2-3, bilaterally at C4-5
worse on the left, bilaterally at C5-6, bilaterally at C6-7 worse on
the right and possibly on the left at C7-T1. Additionally, left
foraminal stenosis at T1-2 could compress the T1 nerve.

## 2019-08-17 HISTORY — PX: RETINAL DETACHMENT SURGERY: SHX105

## 2019-09-20 ENCOUNTER — Other Ambulatory Visit: Payer: Self-pay | Admitting: Neurosurgery

## 2019-09-22 ENCOUNTER — Other Ambulatory Visit: Payer: Self-pay

## 2019-09-22 ENCOUNTER — Encounter
Admission: RE | Admit: 2019-09-22 | Discharge: 2019-09-22 | Disposition: A | Discharge status: Home / Self Care | Payer: Managed Care, Other (non HMO) | Source: Ambulatory Visit | Attending: Neurosurgery | Admitting: Neurosurgery

## 2019-09-22 DIAGNOSIS — Z01810 Encounter for preprocedural cardiovascular examination: Secondary | ICD-10-CM

## 2019-09-22 DIAGNOSIS — Z01818 Encounter for other preprocedural examination: Secondary | ICD-10-CM | POA: Insufficient documentation

## 2019-09-22 DIAGNOSIS — Z20822 Contact with and (suspected) exposure to covid-19: Secondary | ICD-10-CM | POA: Diagnosis not present

## 2019-09-22 HISTORY — DX: Methicillin resistant Staphylococcus aureus infection, unspecified site: A49.02

## 2019-09-22 HISTORY — DX: Anxiety disorder, unspecified: F41.9

## 2019-09-22 HISTORY — DX: Pneumonia, unspecified organism: J18.9

## 2019-09-22 LAB — CBC
HCT: 41.8 % (ref 39.0–52.0)
Hemoglobin: 14.9 g/dL (ref 13.0–17.0)
MCH: 32.9 pg (ref 26.0–34.0)
MCHC: 35.6 g/dL (ref 30.0–36.0)
MCV: 92.3 fL (ref 80.0–100.0)
Platelets: 209 10*3/uL (ref 150–400)
RBC: 4.53 MIL/uL (ref 4.22–5.81)
RDW: 12.6 % (ref 11.5–15.5)
WBC: 8.8 10*3/uL (ref 4.0–10.5)
nRBC: 0 % (ref 0.0–0.2)

## 2019-09-22 LAB — APTT: aPTT: 31 seconds (ref 24–36)

## 2019-09-22 LAB — TYPE AND SCREEN
ABO/RH(D): A POS
Antibody Screen: NEGATIVE

## 2019-09-22 LAB — URINALYSIS, ROUTINE W REFLEX MICROSCOPIC
Bilirubin Urine: NEGATIVE
Glucose, UA: NEGATIVE mg/dL
Hgb urine dipstick: NEGATIVE
Ketones, ur: NEGATIVE mg/dL
Leukocytes,Ua: NEGATIVE
Nitrite: NEGATIVE
Protein, ur: NEGATIVE mg/dL
Specific Gravity, Urine: 1.018 (ref 1.005–1.030)
pH: 6 (ref 5.0–8.0)

## 2019-09-22 LAB — PROTIME-INR
INR: 0.9 (ref 0.8–1.2)
Prothrombin Time: 11.9 seconds (ref 11.4–15.2)

## 2019-09-22 LAB — BASIC METABOLIC PANEL
Anion gap: 9 (ref 5–15)
BUN: 20 mg/dL (ref 8–23)
CO2: 24 mmol/L (ref 22–32)
Calcium: 8.9 mg/dL (ref 8.9–10.3)
Chloride: 104 mmol/L (ref 98–111)
Creatinine, Ser: 1.1 mg/dL (ref 0.61–1.24)
GFR calc Af Amer: >60 mL/min (ref >60)
GFR calc non Af Amer: >60 mL/min (ref >60)
Glucose, Bld: 121 mg/dL — ABNORMAL HIGH (ref 70–99)
Potassium: 4.3 mmol/L (ref 3.5–5.1)
Sodium: 137 mmol/L (ref 135–145)

## 2019-09-22 LAB — SURGICAL PCR SCREEN
MRSA, PCR: NEGATIVE
Staphylococcus aureus: NEGATIVE

## 2019-09-22 NOTE — Patient Instructions (Addendum)
Your procedure is scheduled on: Wednesday, August 11 Report to Day Surgery on the 2nd floor of the [facility]. To find out your arrival time, please call [phone] between 1PM - 3PM on: Tuesday, August 10  REMEMBER: Instructions that are not followed completely may result in serious medical risk, up to and including death; or upon the discretion of your surgeon and anesthesiologist your surgery may need to be rescheduled.  Do not eat food after midnight the night before surgery.  No gum chewing, lozengers or hard candies.  You may however, drink water up to 2 hours before you are scheduled to arrive for your surgery. Do not drink anything within 2 hours of your scheduled arrival time.  TAKE THESE MEDICATIONS THE MORNING OF SURGERY WITH A SIP OF WATER:  1.  Albuterol inhaler 2.  Advair inhaler 3.  Gabapentin 4.  Hydrocodone if needed for pain 5.  Duoneb nebulizer 6.  Omeprazole - (take one the night before and one on the morning of surgery - helps to prevent nausea after surgery.)  Use inhalers on the day of surgery and bring to the hospital.  Follow recommendations from Cardiologist, Pulmonologist or PCP regarding stopping Aspirin. Stop as of August 4.  Stop Anti-inflammatories (NSAIDS) such as Advil, Aleve, Ibuprofen, Motrin, Naproxen, Naprosyn and Aspirin based products such as Excedrin, Goodys Powder, BC Powder. (May take Tylenol or Acetaminophen if needed.)  Stop ANY OVER THE COUNTER supplements until after surgery. (alpha-lipoic acid, cinnamon, coenzyme Q-10, fish oil, meloxicam, turmeric, vitamin C)  No Alcohol for 24 hours before or after surgery.  On the morning of surgery brush your teeth with toothpaste and water, you may rinse your mouth with mouthwash if you wish. Do not swallow any toothpaste or mouthwash.  Do not wear jewelry.  Do not wear lotions, powders, or perfumes.   Do not bring valuables to the hospital. [facility] is not responsible for any  missing/lost belongings or valuables.   Use CHG Soap as directed on instruction sheet.  Notify your doctor if there is any change in your medical condition (cold, fever, infection).  Wear comfortable clothing (specific to your surgery type) to the hospital.  Plan for stool softeners for home use; pain medications have a tendency to cause constipation. You can also help prevent constipation by eating foods high in fiber such as fruits and vegetables and drinking plenty of fluids as your diet allows.  After surgery, you can help prevent lung complications by doing breathing exercises.  Take deep breaths and cough every 1-2 hours. Your doctor may order a device called an Incentive Spirometer to help you take deep breaths.  If you are being admitted to the hospital overnight, leave your suitcase in the car. After surgery it may be brought to your room.  Please call the [facility] at [phone] if you have any questions about these instructions.  Visitation Policy:  Patients undergoing a surgery or procedure may have one family member or support person with them as long as that person is not COVID-19 positive or experiencing its symptoms.  That person may remain in the waiting area during the procedure.  Inpatient Visitation Update:   Two designated support people may visit a patient during visiting hours 7 am to 8 pm. It must be the same two designated people for the duration of the patient stay. The visitors may come and go during the day, and there is no switching out to have different visitors. A mask  must be worn at all times, including in the patient room.  As a reminder, masks are still required for all [facility] team members, patients and visitors in all [facility] facilities.   Systemwide, no visitors 17 or younger.

## 2019-09-23 LAB — SARS CORONAVIRUS 2 (TAT 6-24 HRS): SARS Coronavirus 2: NEGATIVE

## 2019-09-27 ENCOUNTER — Observation Stay
Admission: RE | Admit: 2019-09-27 | Discharge: 2019-09-28 | Disposition: A | Discharge status: Home / Self Care | Payer: Managed Care, Other (non HMO) | Source: Ambulatory Visit | Attending: Neurosurgery | Admitting: Neurosurgery

## 2019-09-27 ENCOUNTER — Encounter: Payer: Self-pay | Admitting: Neurosurgery

## 2019-09-27 ENCOUNTER — Inpatient Hospital Stay: Payer: Managed Care, Other (non HMO)

## 2019-09-27 ENCOUNTER — Ambulatory Visit: Payer: Managed Care, Other (non HMO)

## 2019-09-27 ENCOUNTER — Ambulatory Visit: Payer: Managed Care, Other (non HMO) | Admitting: Anesthesiology

## 2019-09-27 ENCOUNTER — Encounter
Admission: RE | Disposition: A | Discharge status: Home / Self Care | Payer: Self-pay | Source: Ambulatory Visit | Attending: Neurosurgery

## 2019-09-27 ENCOUNTER — Other Ambulatory Visit: Payer: Self-pay

## 2019-09-27 DIAGNOSIS — Z951 Presence of aortocoronary bypass graft: Secondary | ICD-10-CM | POA: Diagnosis not present

## 2019-09-27 DIAGNOSIS — Z981 Arthrodesis status: Secondary | ICD-10-CM

## 2019-09-27 DIAGNOSIS — I252 Old myocardial infarction: Secondary | ICD-10-CM | POA: Diagnosis not present

## 2019-09-27 DIAGNOSIS — I10 Essential (primary) hypertension: Secondary | ICD-10-CM | POA: Diagnosis not present

## 2019-09-27 DIAGNOSIS — Z419 Encounter for procedure for purposes other than remedying health state, unspecified: Secondary | ICD-10-CM

## 2019-09-27 DIAGNOSIS — M5412 Radiculopathy, cervical region: Secondary | ICD-10-CM | POA: Diagnosis present

## 2019-09-27 DIAGNOSIS — E119 Type 2 diabetes mellitus without complications: Secondary | ICD-10-CM | POA: Diagnosis not present

## 2019-09-27 DIAGNOSIS — J449 Chronic obstructive pulmonary disease, unspecified: Secondary | ICD-10-CM | POA: Insufficient documentation

## 2019-09-27 DIAGNOSIS — Z79899 Other long term (current) drug therapy: Secondary | ICD-10-CM | POA: Insufficient documentation

## 2019-09-27 DIAGNOSIS — Z87891 Personal history of nicotine dependence: Secondary | ICD-10-CM | POA: Diagnosis not present

## 2019-09-27 HISTORY — PX: ANTERIOR CERVICAL DECOMP/DISCECTOMY FUSION: SHX1161

## 2019-09-27 LAB — ABO/RH: ABO/RH(D): A POS

## 2019-09-27 LAB — CBC
HCT: 42 % (ref 39.0–52.0)
Hemoglobin: 14.5 g/dL (ref 13.0–17.0)
MCH: 32.3 pg (ref 26.0–34.0)
MCHC: 34.5 g/dL (ref 30.0–36.0)
MCV: 93.5 fL (ref 80.0–100.0)
Platelets: 196 10*3/uL (ref 150–400)
RBC: 4.49 MIL/uL (ref 4.22–5.81)
RDW: 12.5 % (ref 11.5–15.5)
WBC: 13.5 10*3/uL — ABNORMAL HIGH (ref 4.0–10.5)
nRBC: 0 % (ref 0.0–0.2)

## 2019-09-27 LAB — GLUCOSE, CAPILLARY
Glucose-Capillary: 112 mg/dL — ABNORMAL HIGH (ref 70–99)
Glucose-Capillary: 144 mg/dL — ABNORMAL HIGH (ref 70–99)
Glucose-Capillary: 222 mg/dL — ABNORMAL HIGH (ref 70–99)
Glucose-Capillary: 198 mg/dL — ABNORMAL HIGH (ref 70–99)
Glucose-Capillary: 176 mg/dL — ABNORMAL HIGH (ref 70–99)

## 2019-09-27 LAB — CREATININE, SERUM
Creatinine, Ser: 1.28 mg/dL — ABNORMAL HIGH (ref 0.61–1.24)
GFR calc Af Amer: >60 mL/min (ref >60)
GFR calc non Af Amer: 55 mL/min — ABNORMAL LOW (ref >60)

## 2019-09-27 IMAGING — RF DG C-ARM 1-60 MIN
1 series · 3 of 3 positions shown · non-contrast
Comparison: MRI cervical spine [DATE]

CLINICAL DATA: Anterior cervical fusion from C4-C7

EXAM:
CERVICAL SPINE - 2-3 VIEW; DG C-ARM 1-60 MIN

[Series 1: dg x-ray · 0.20mm/px · 3 of 3 slices shown]
[im 1/3]
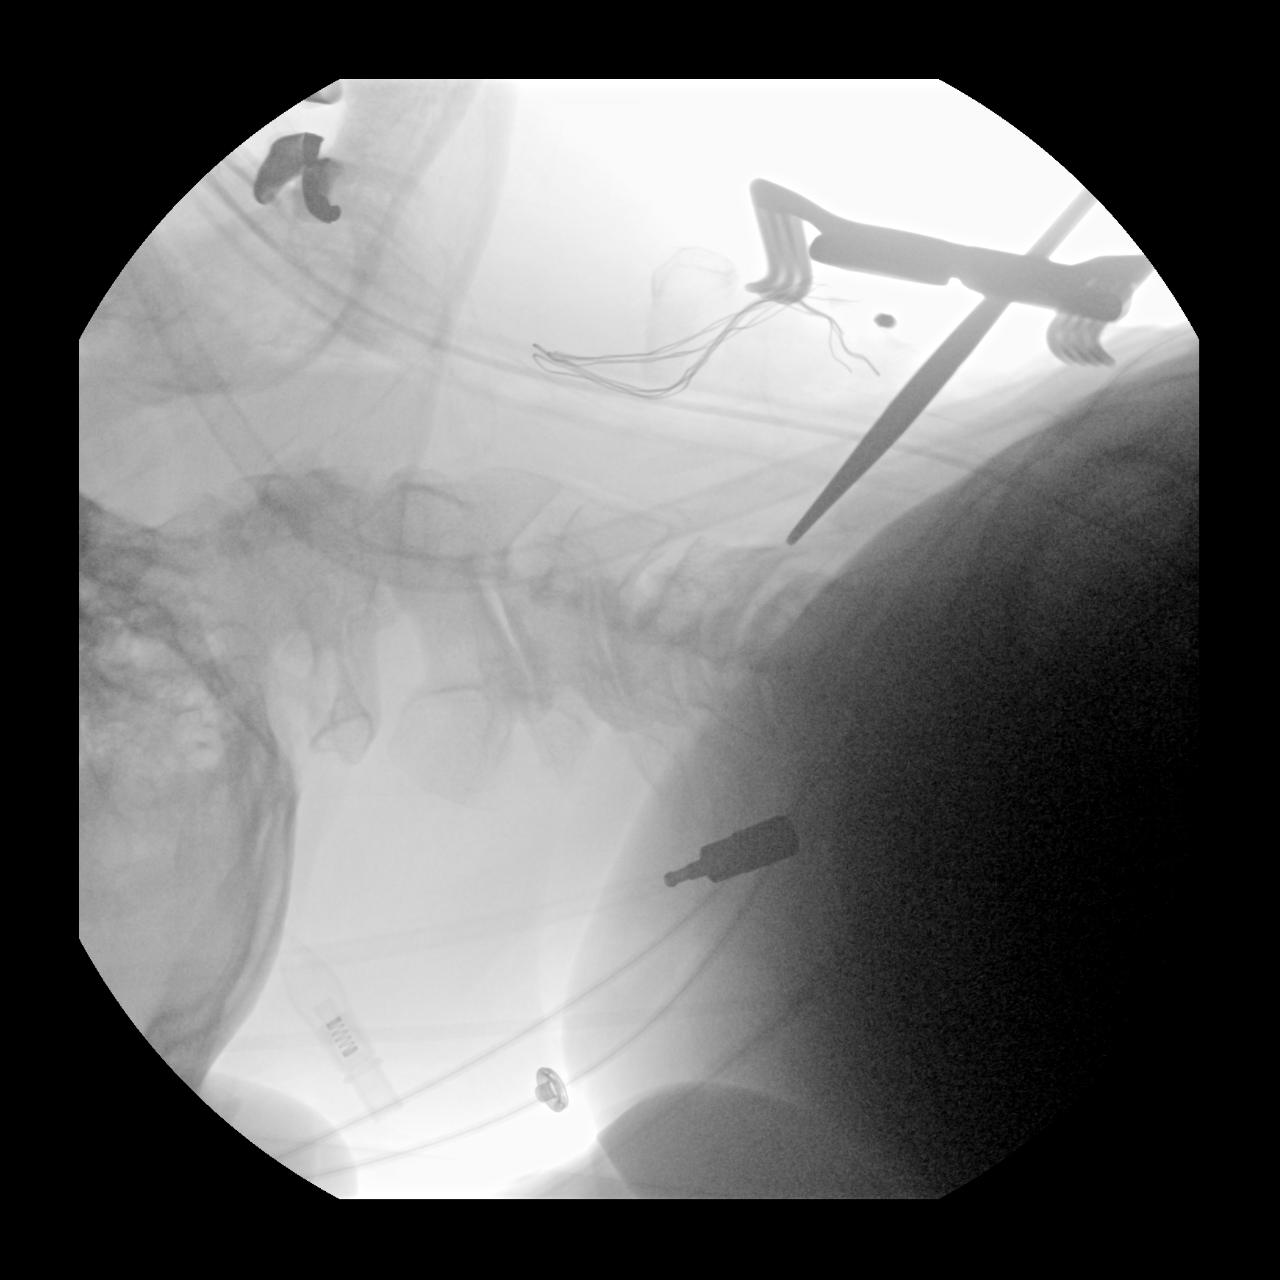
[im 2/3]
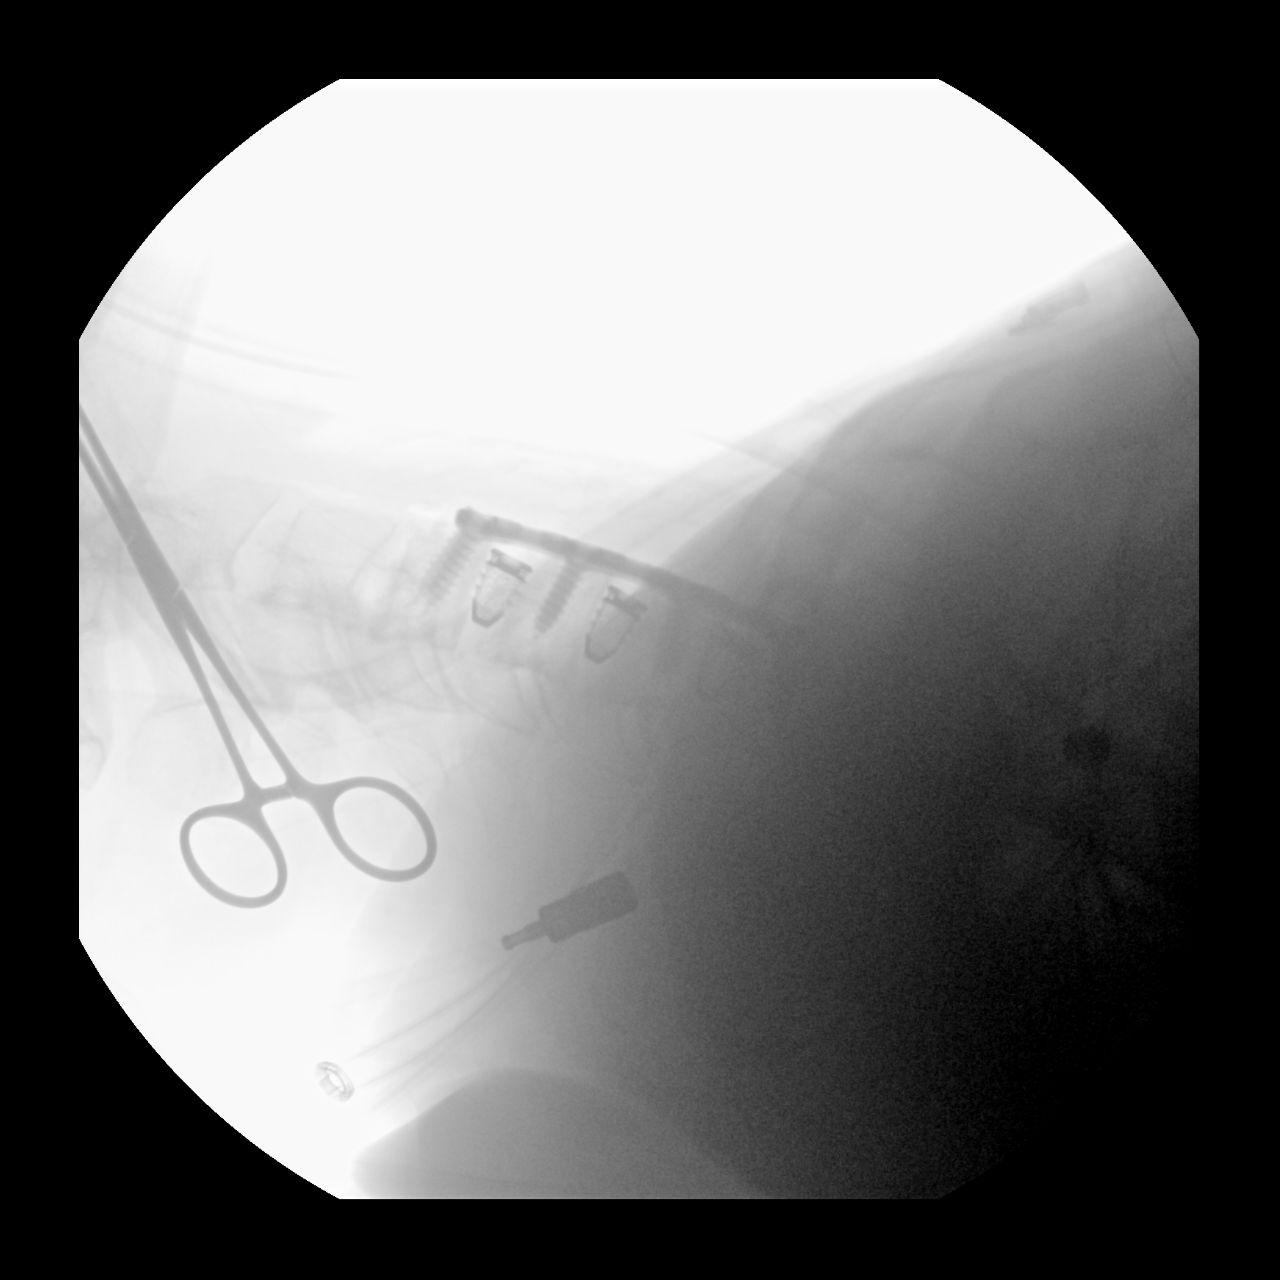
[im 3/3]
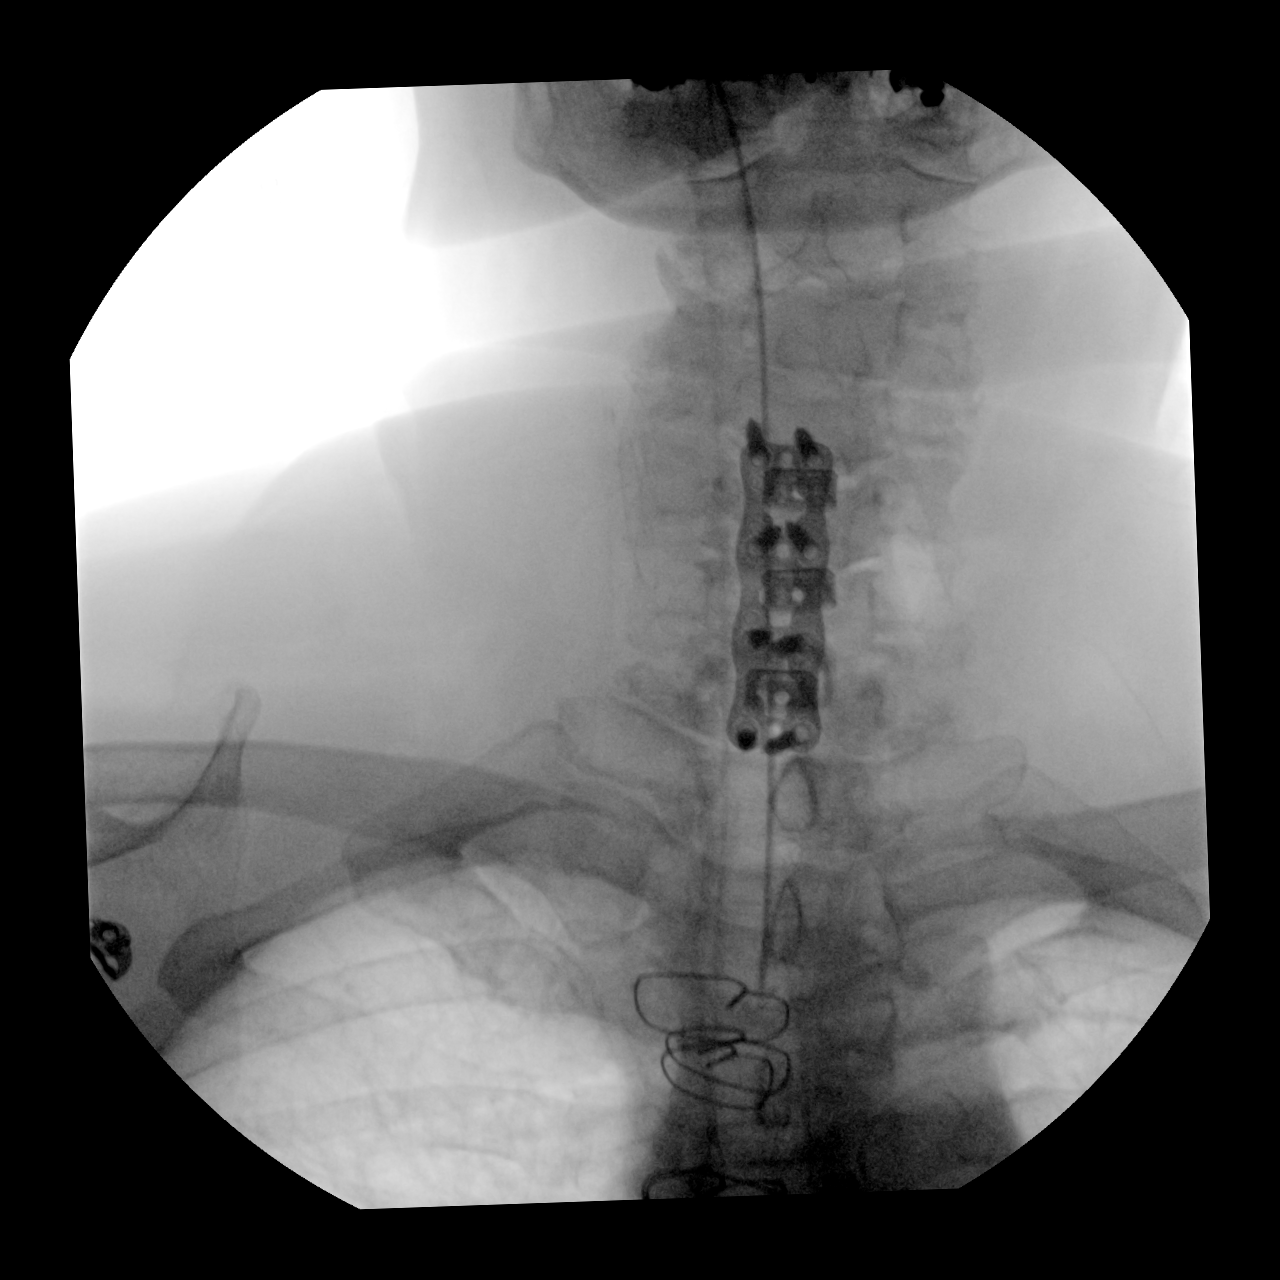

[3 of 3 positions shown; findings below may reference images not displayed]

FINDINGS: 3 intraoperative spot fluoro images obtained during ACDF with
plating from C4-C7. Initial image shows anterior localization of the
inferior C4 endplate. Subsequent 2 images reveal interbody spacers
at C4-5, C5-6, and C6-7 with anterior plate extending from C4 to C7.
Inferior extent of the hardware not well seen on lateral projection
due to superimposition of patient's shoulders.
IMPRESSION: Intraoperative assessment during ACDF with plating from C4-C7.

## 2019-09-27 IMAGING — RF DG CERVICAL SPINE 2 OR 3 VIEWS
1 series · 3 of 3 positions shown · non-contrast
Comparison: MRI cervical spine [DATE]

CLINICAL DATA: Anterior cervical fusion from C4-C7

EXAM:
CERVICAL SPINE - 2-3 VIEW; DG C-ARM 1-60 MIN

[Series 1: dg x-ray · 0.20mm/px · 3 of 3 slices shown]
[im 1/3]
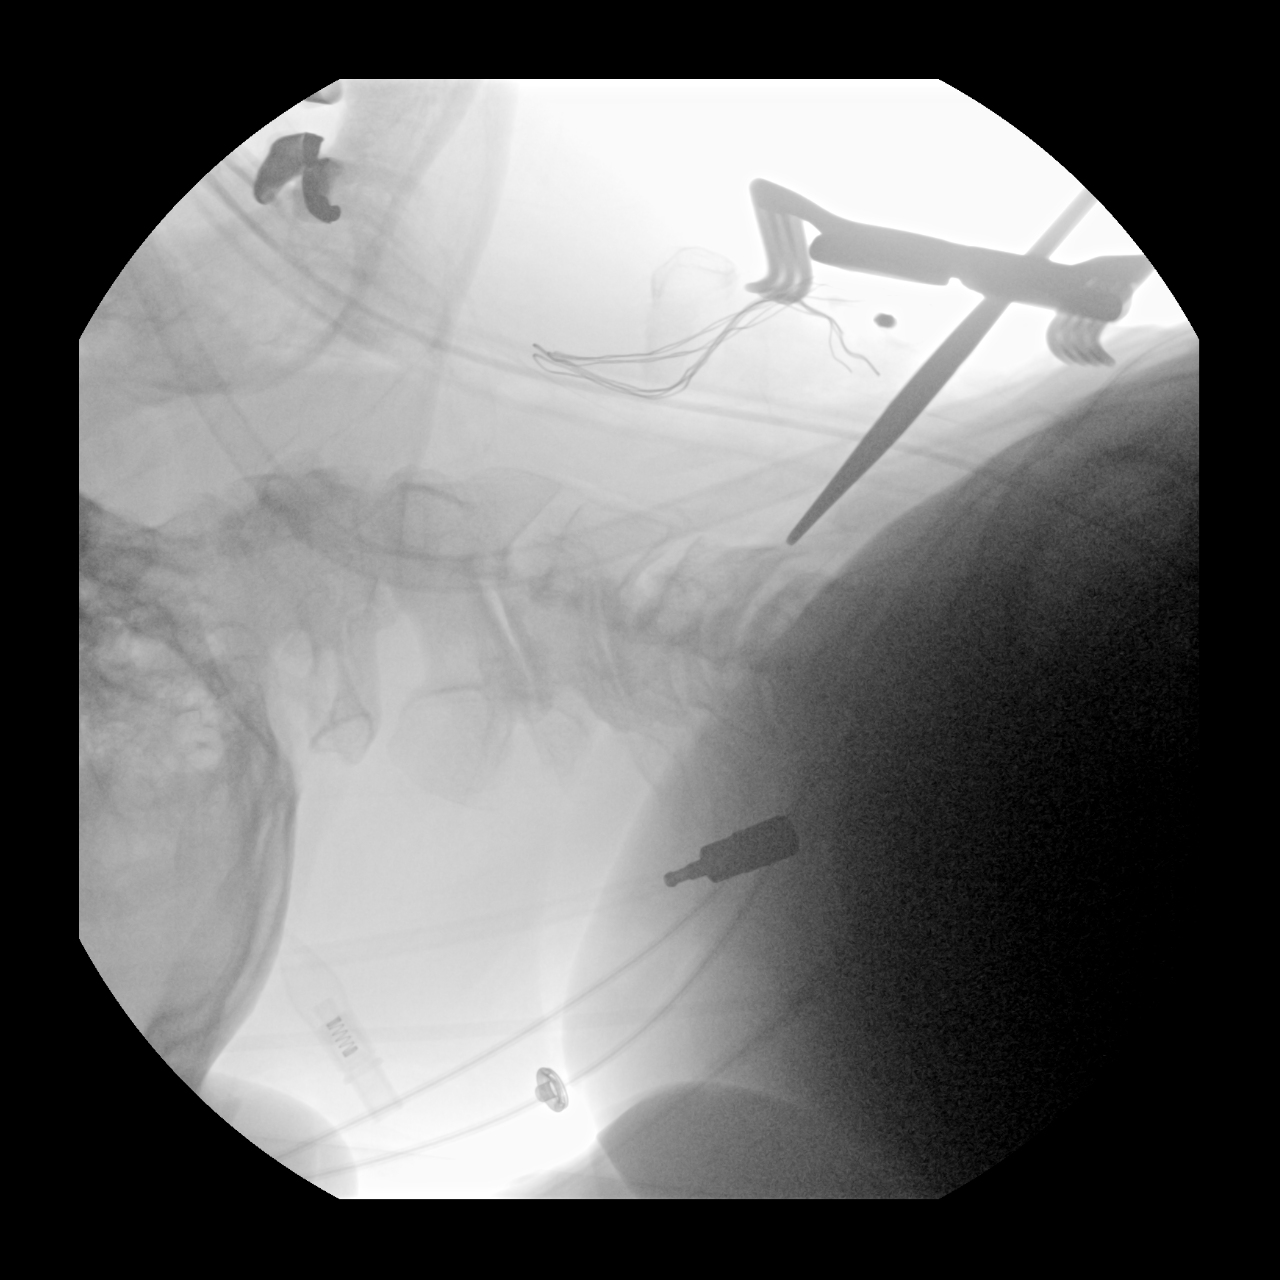
[im 2/3]
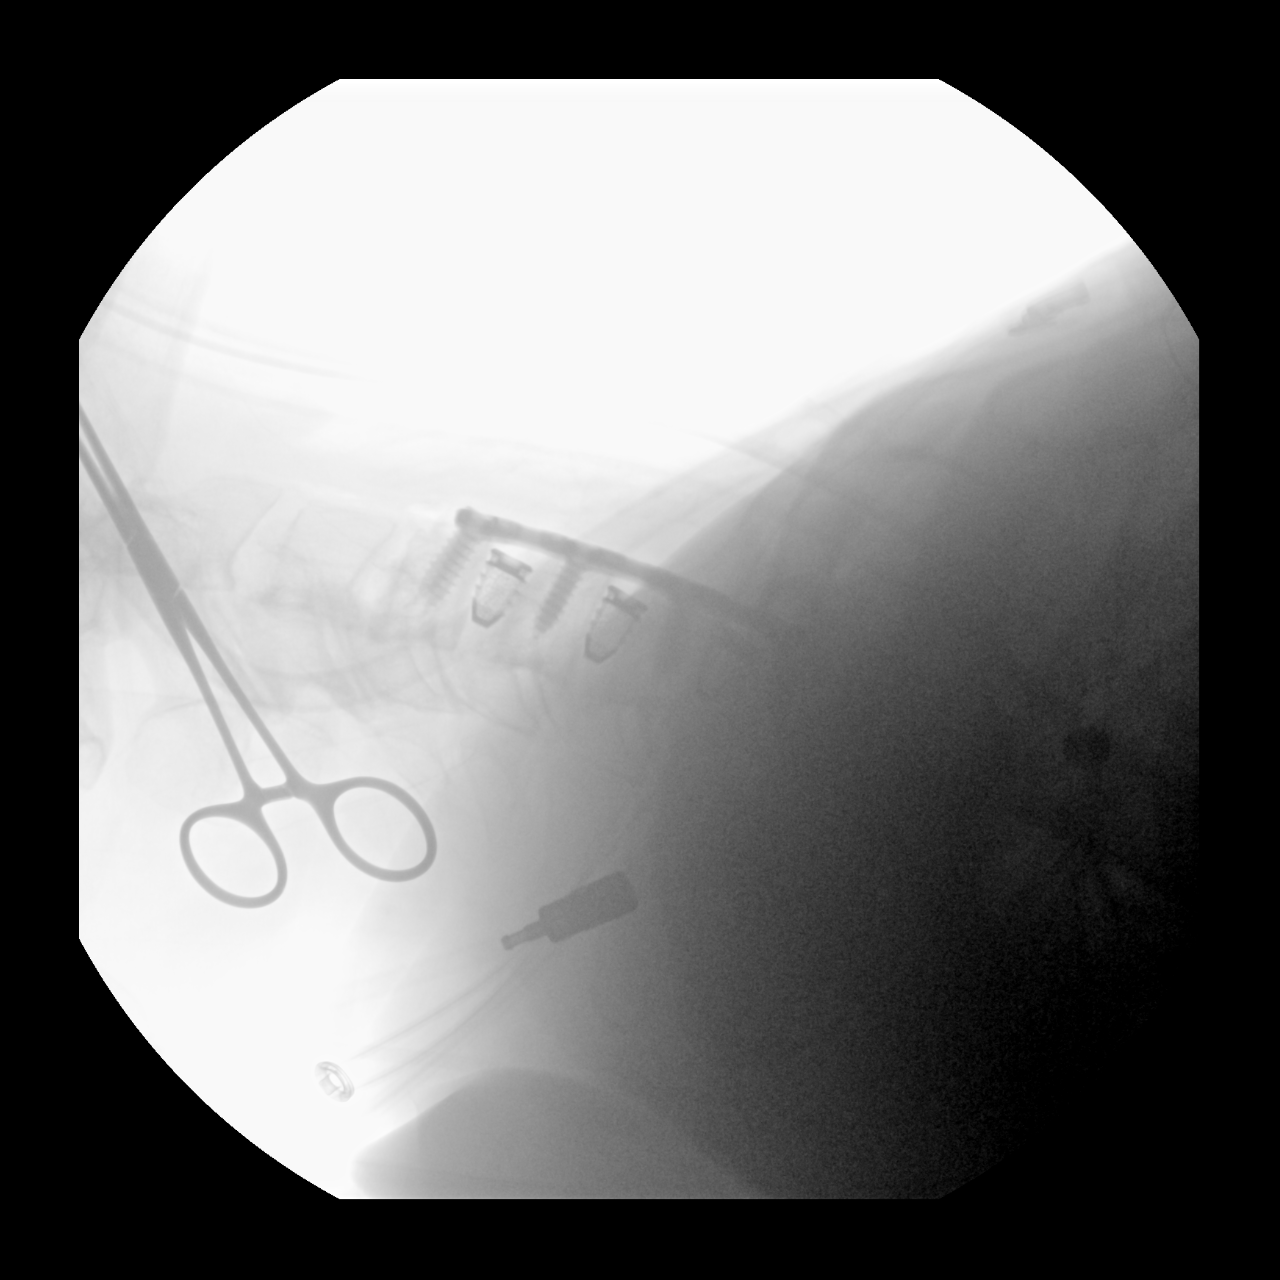
[im 3/3]
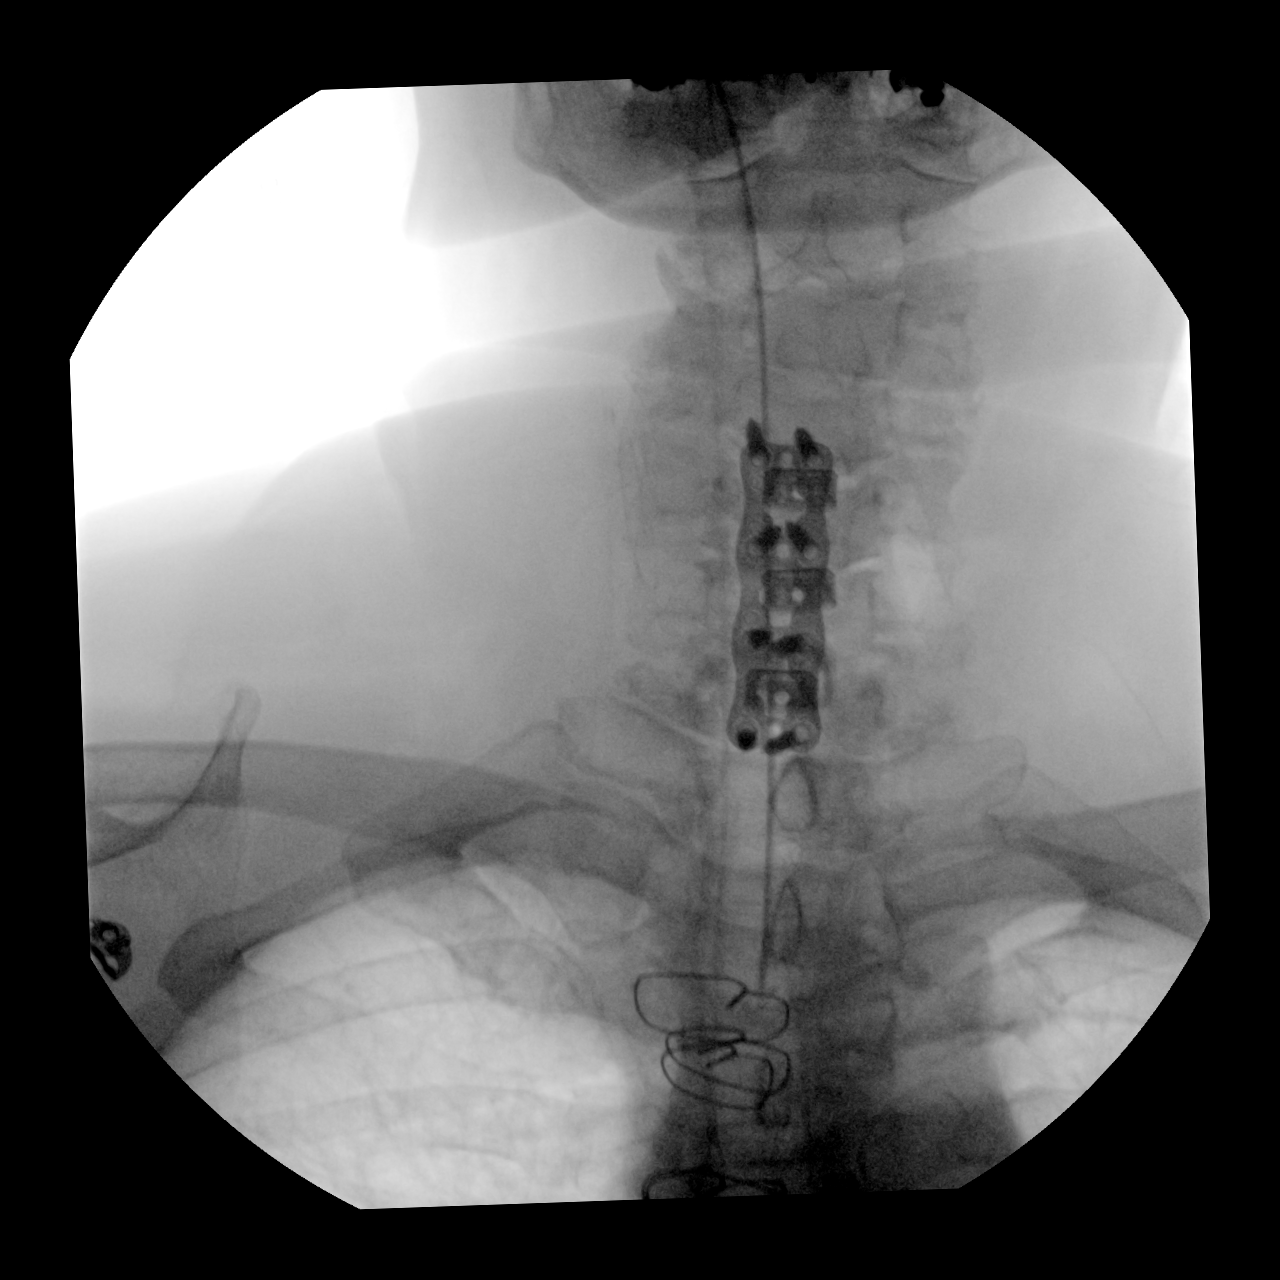

[3 of 3 positions shown; findings below may reference images not displayed]

FINDINGS: 3 intraoperative spot fluoro images obtained during ACDF with
plating from C4-C7. Initial image shows anterior localization of the
inferior C4 endplate. Subsequent 2 images reveal interbody spacers
at C4-5, C5-6, and C6-7 with anterior plate extending from C4 to C7.
Inferior extent of the hardware not well seen on lateral projection
due to superimposition of patient's shoulders.
IMPRESSION: Intraoperative assessment during ACDF with plating from C4-C7.

## 2019-09-27 IMAGING — RF DG C-ARM 1-60 MIN
1 series · 3 of 3 positions shown · non-contrast
Comparison: MRI cervical spine [DATE]

CLINICAL DATA: Anterior cervical fusion from C4-C7

EXAM:
CERVICAL SPINE - 2-3 VIEW; DG C-ARM 1-60 MIN

[Series 1: dg x-ray · 0.20mm/px · 3 of 3 slices shown]
[im 1/3]
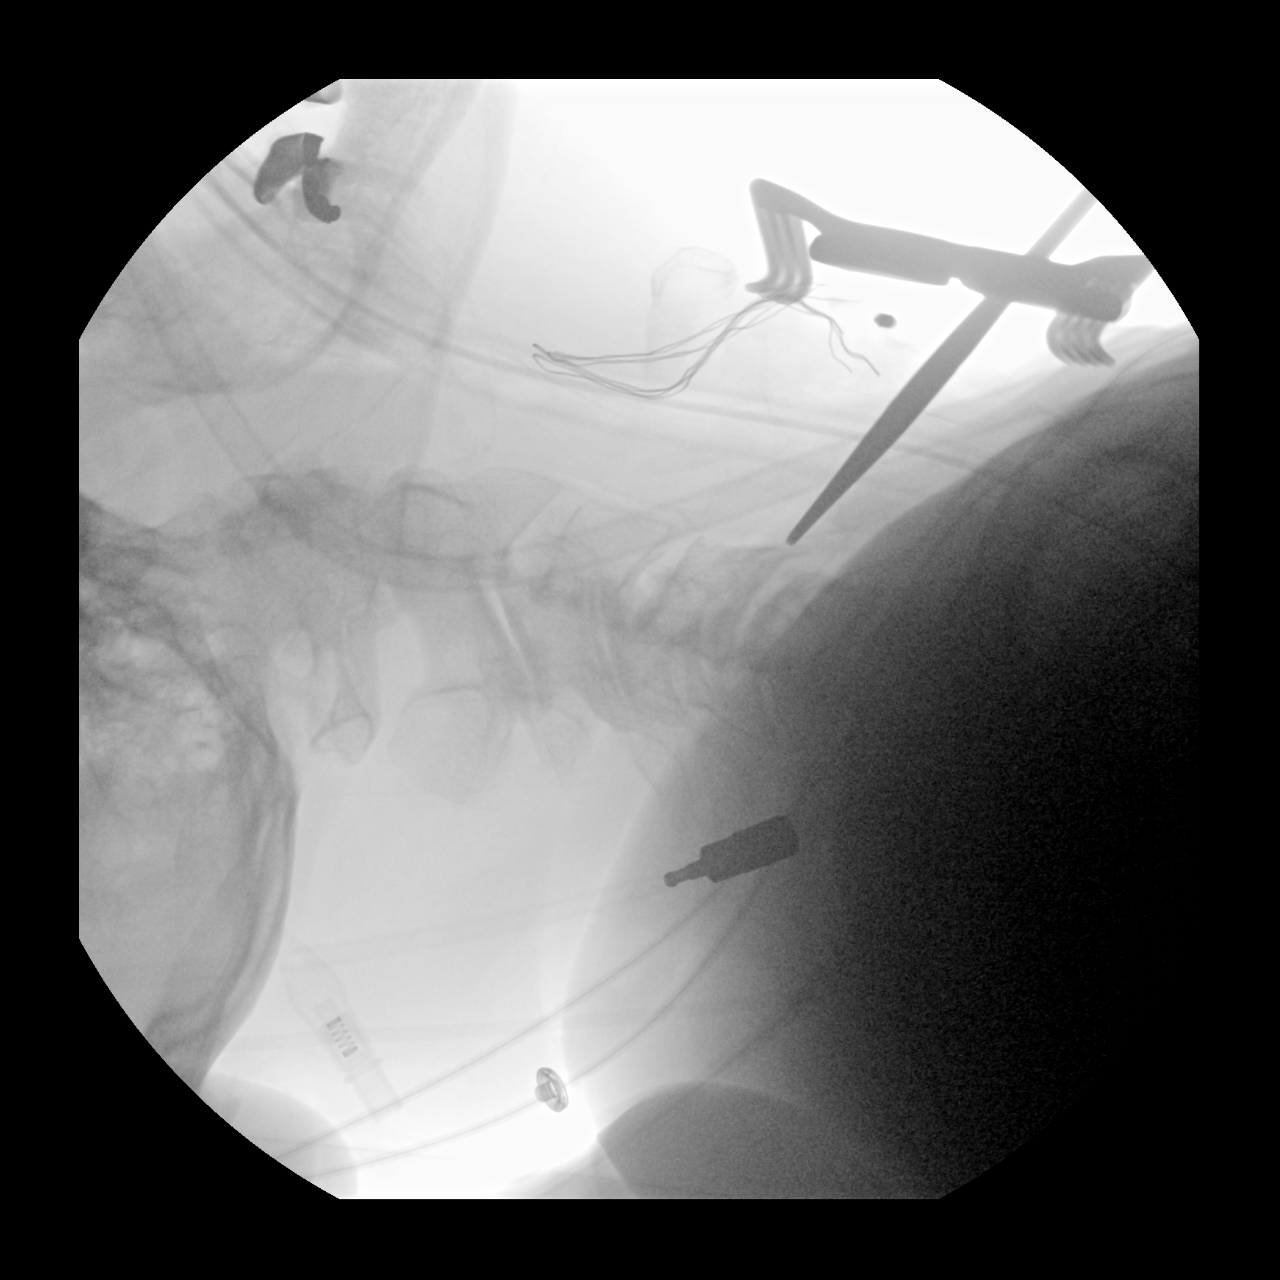
[im 2/3]
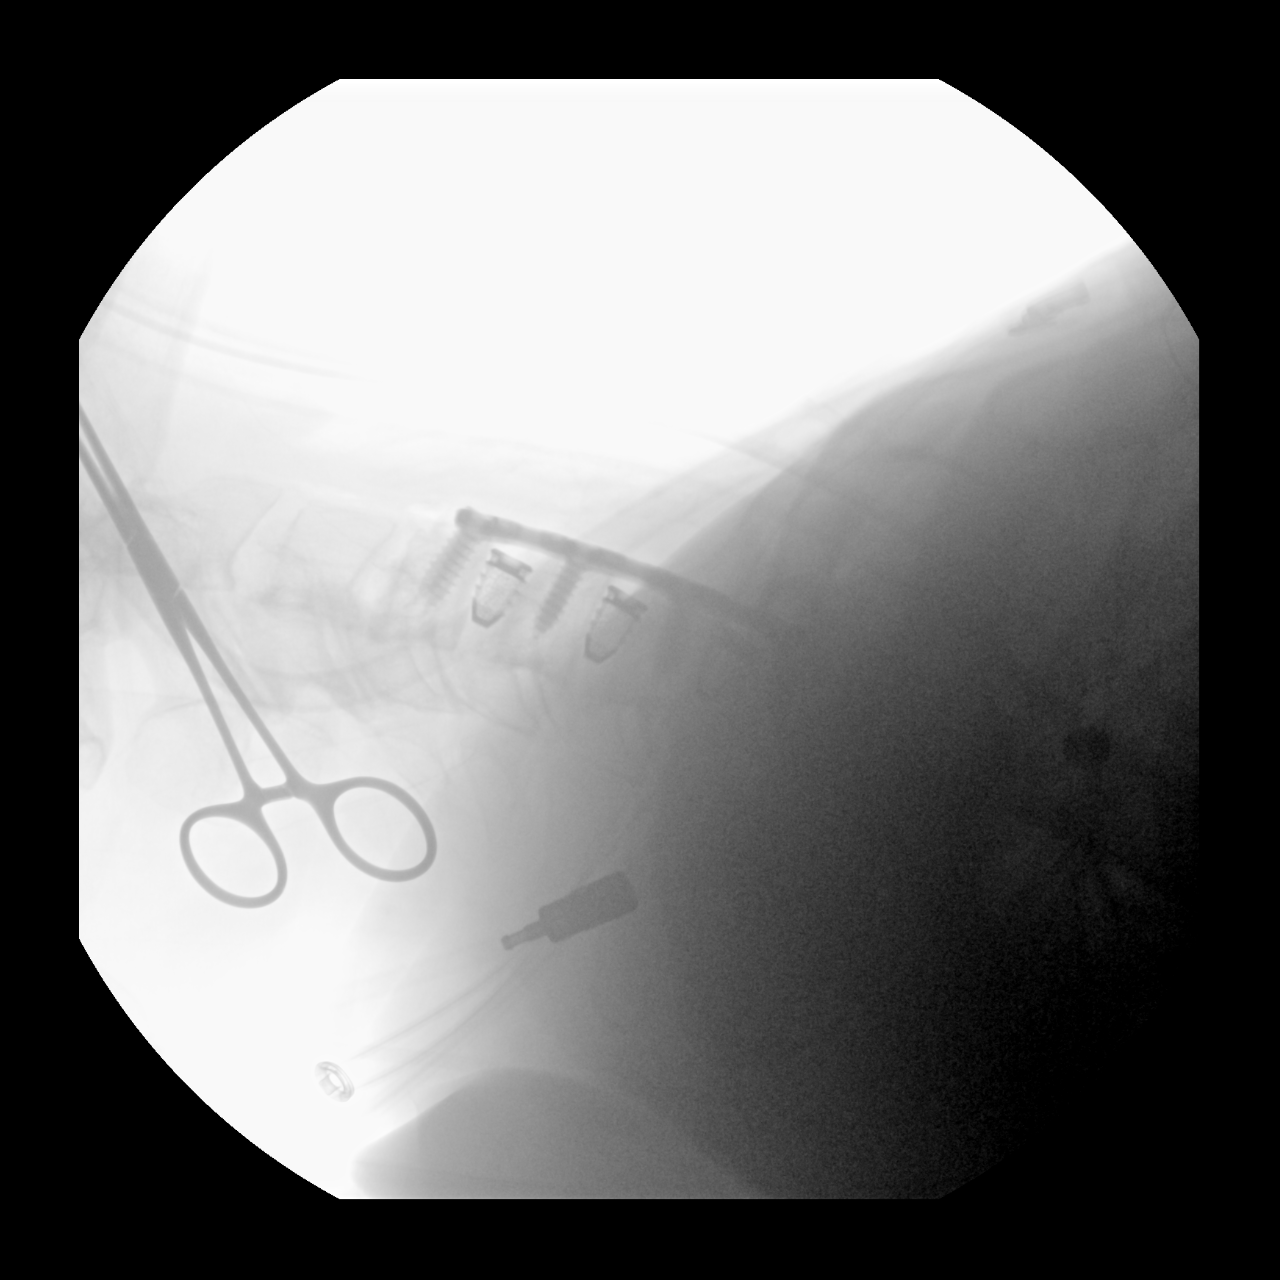
[im 3/3]
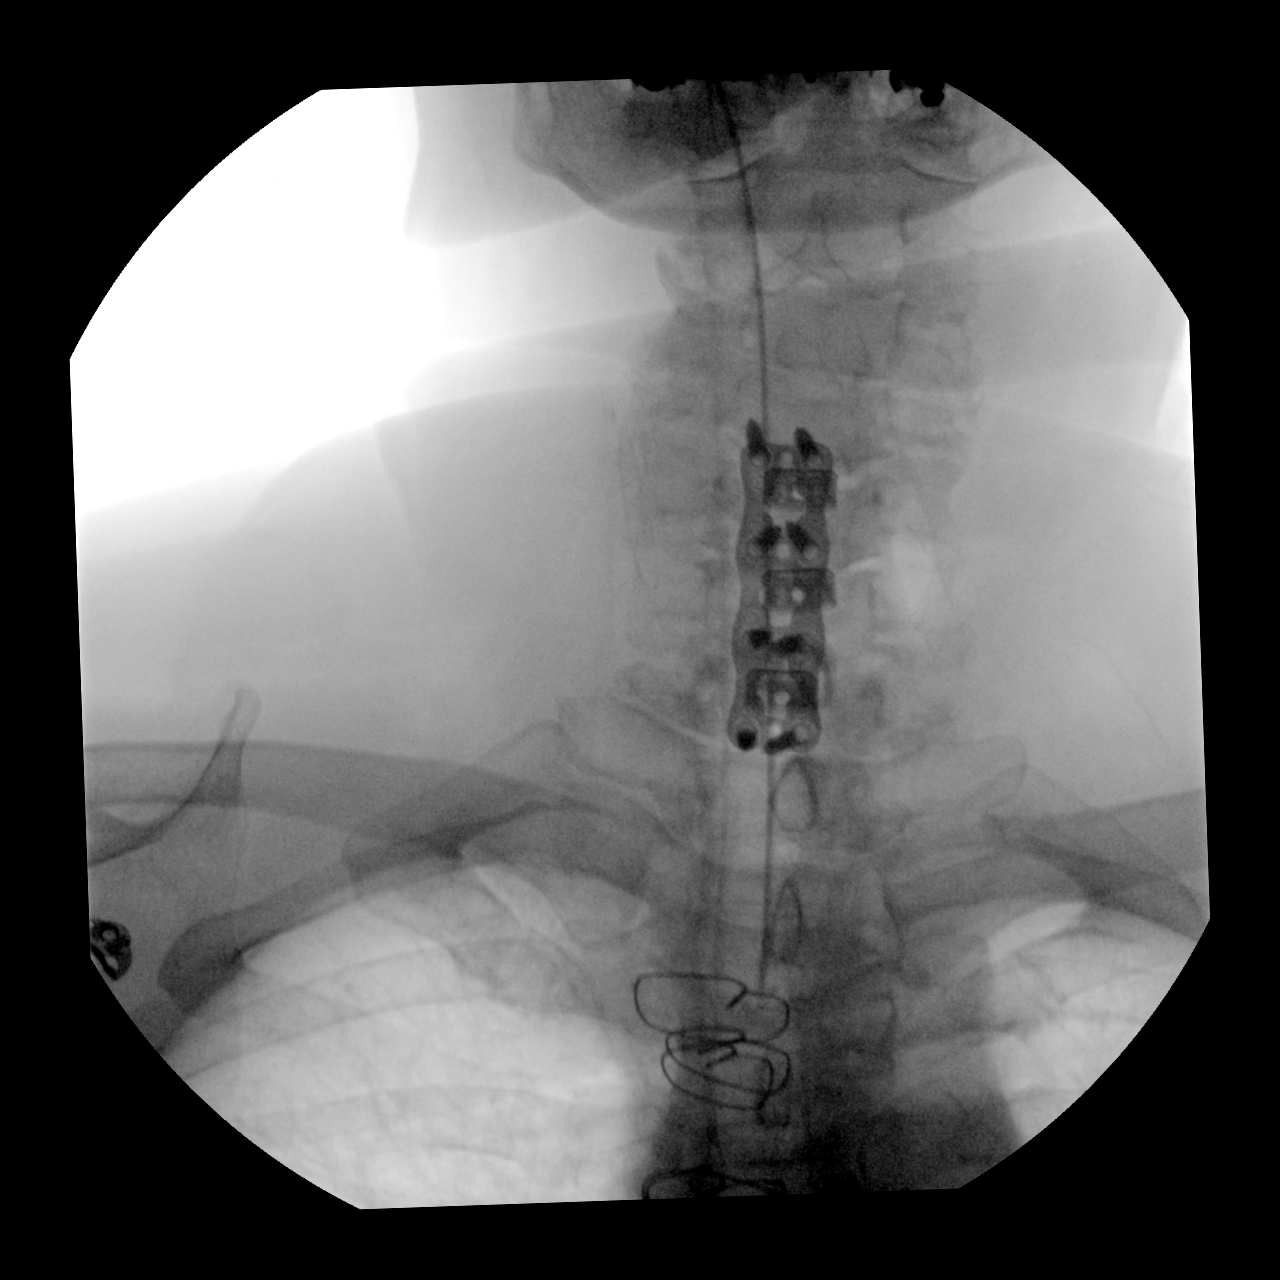

[3 of 3 positions shown; findings below may reference images not displayed]

FINDINGS: 3 intraoperative spot fluoro images obtained during ACDF with
plating from C4-C7. Initial image shows anterior localization of the
inferior C4 endplate. Subsequent 2 images reveal interbody spacers
at C4-5, C5-6, and C6-7 with anterior plate extending from C4 to C7.
Inferior extent of the hardware not well seen on lateral projection
due to superimposition of patient's shoulders.
IMPRESSION: Intraoperative assessment during ACDF with plating from C4-C7.

## 2019-09-27 IMAGING — CR DG CERVICAL SPINE 2 OR 3 VIEWS
3 series · 3 of 3 positions shown · non-contrast
Comparison: MRI [DATE]

CLINICAL DATA: Post C4-C7 ACDF

EXAM:
CERVICAL SPINE - 2-3 VIEW

[c-spine lat]
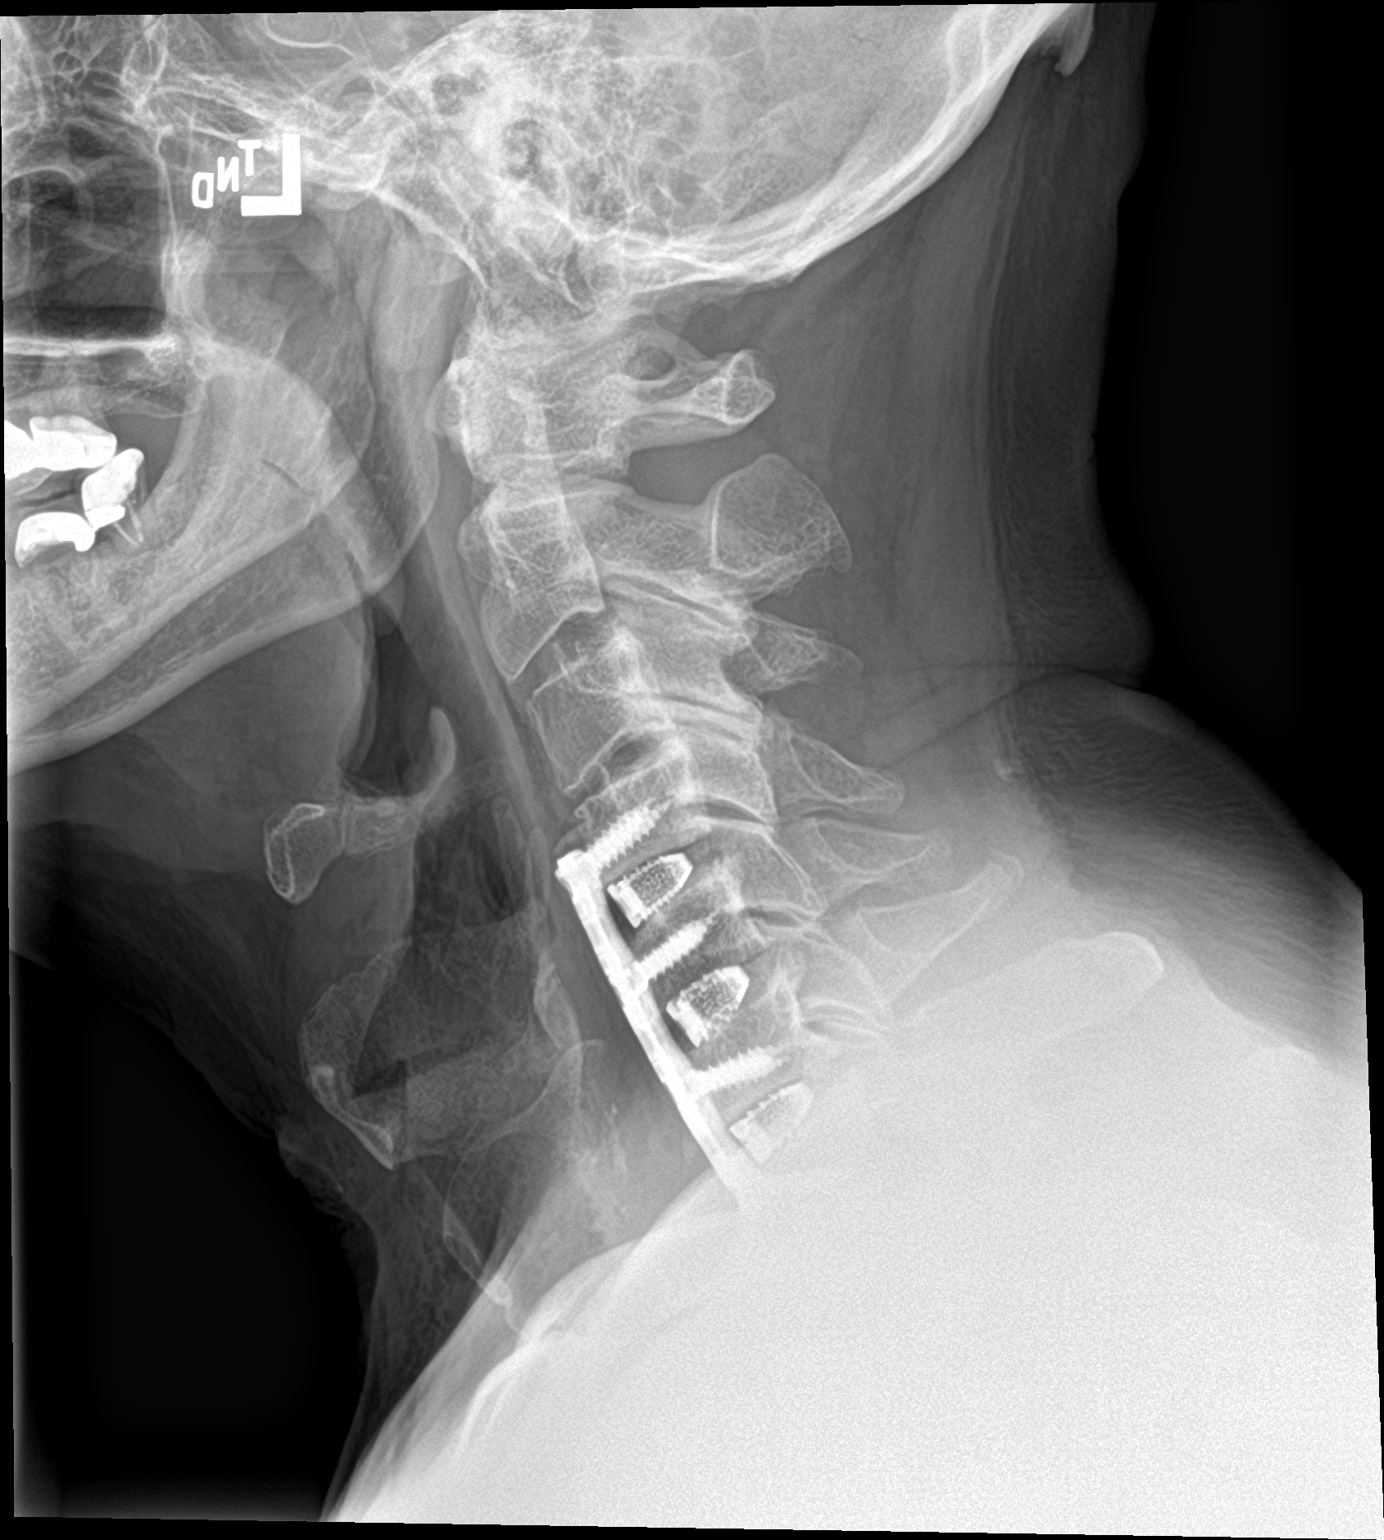

[c-spine ap]
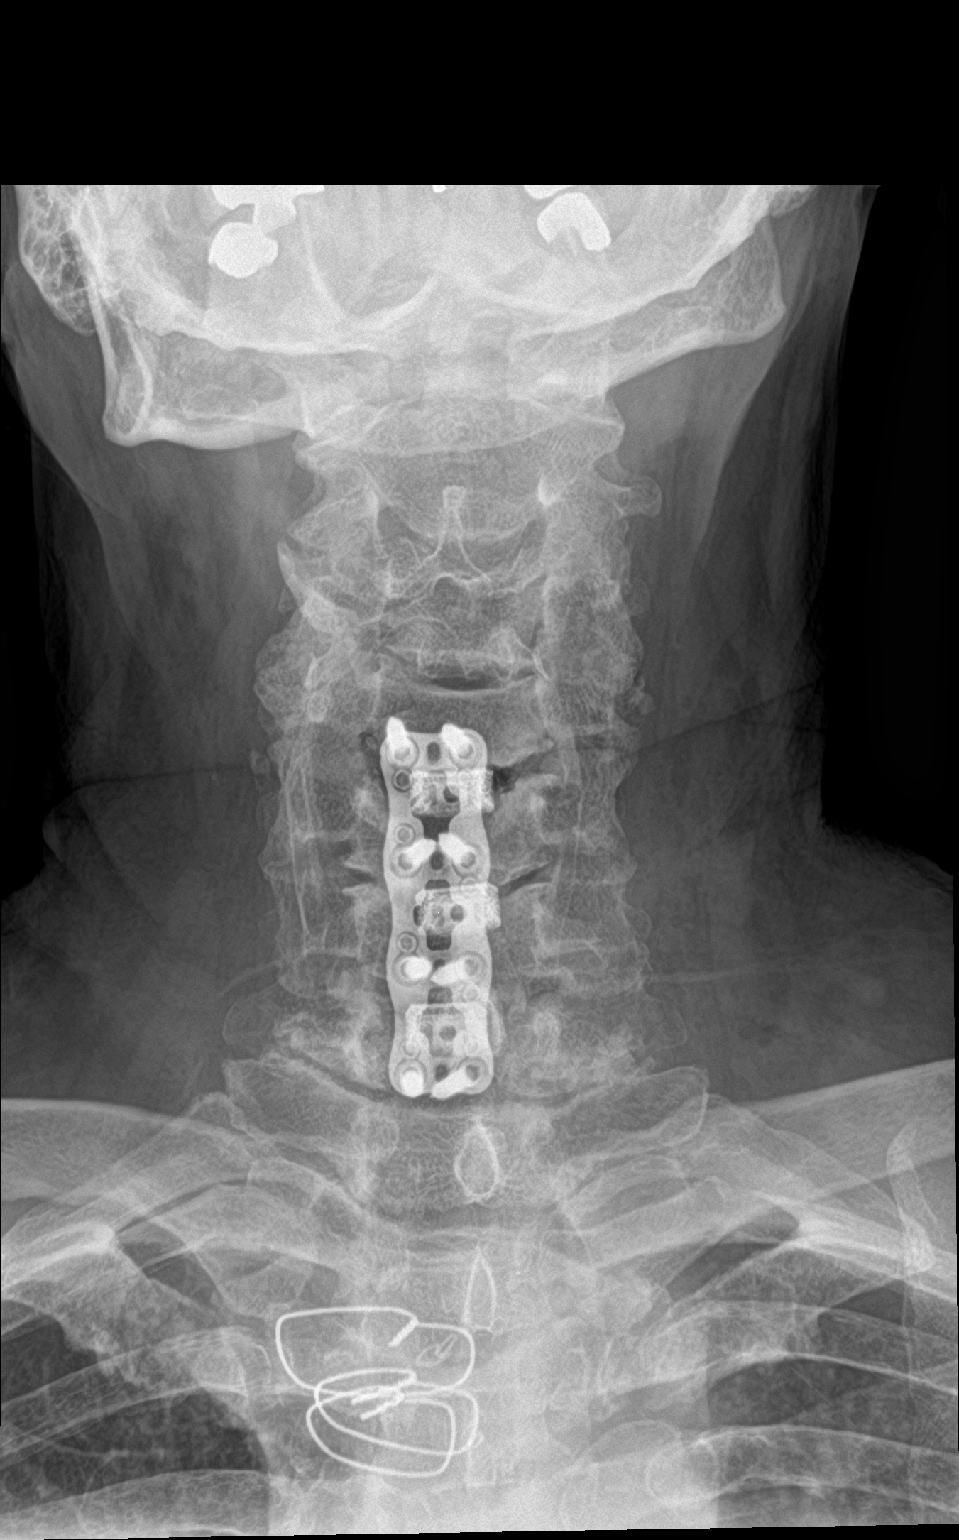

[c-spine swimmers]
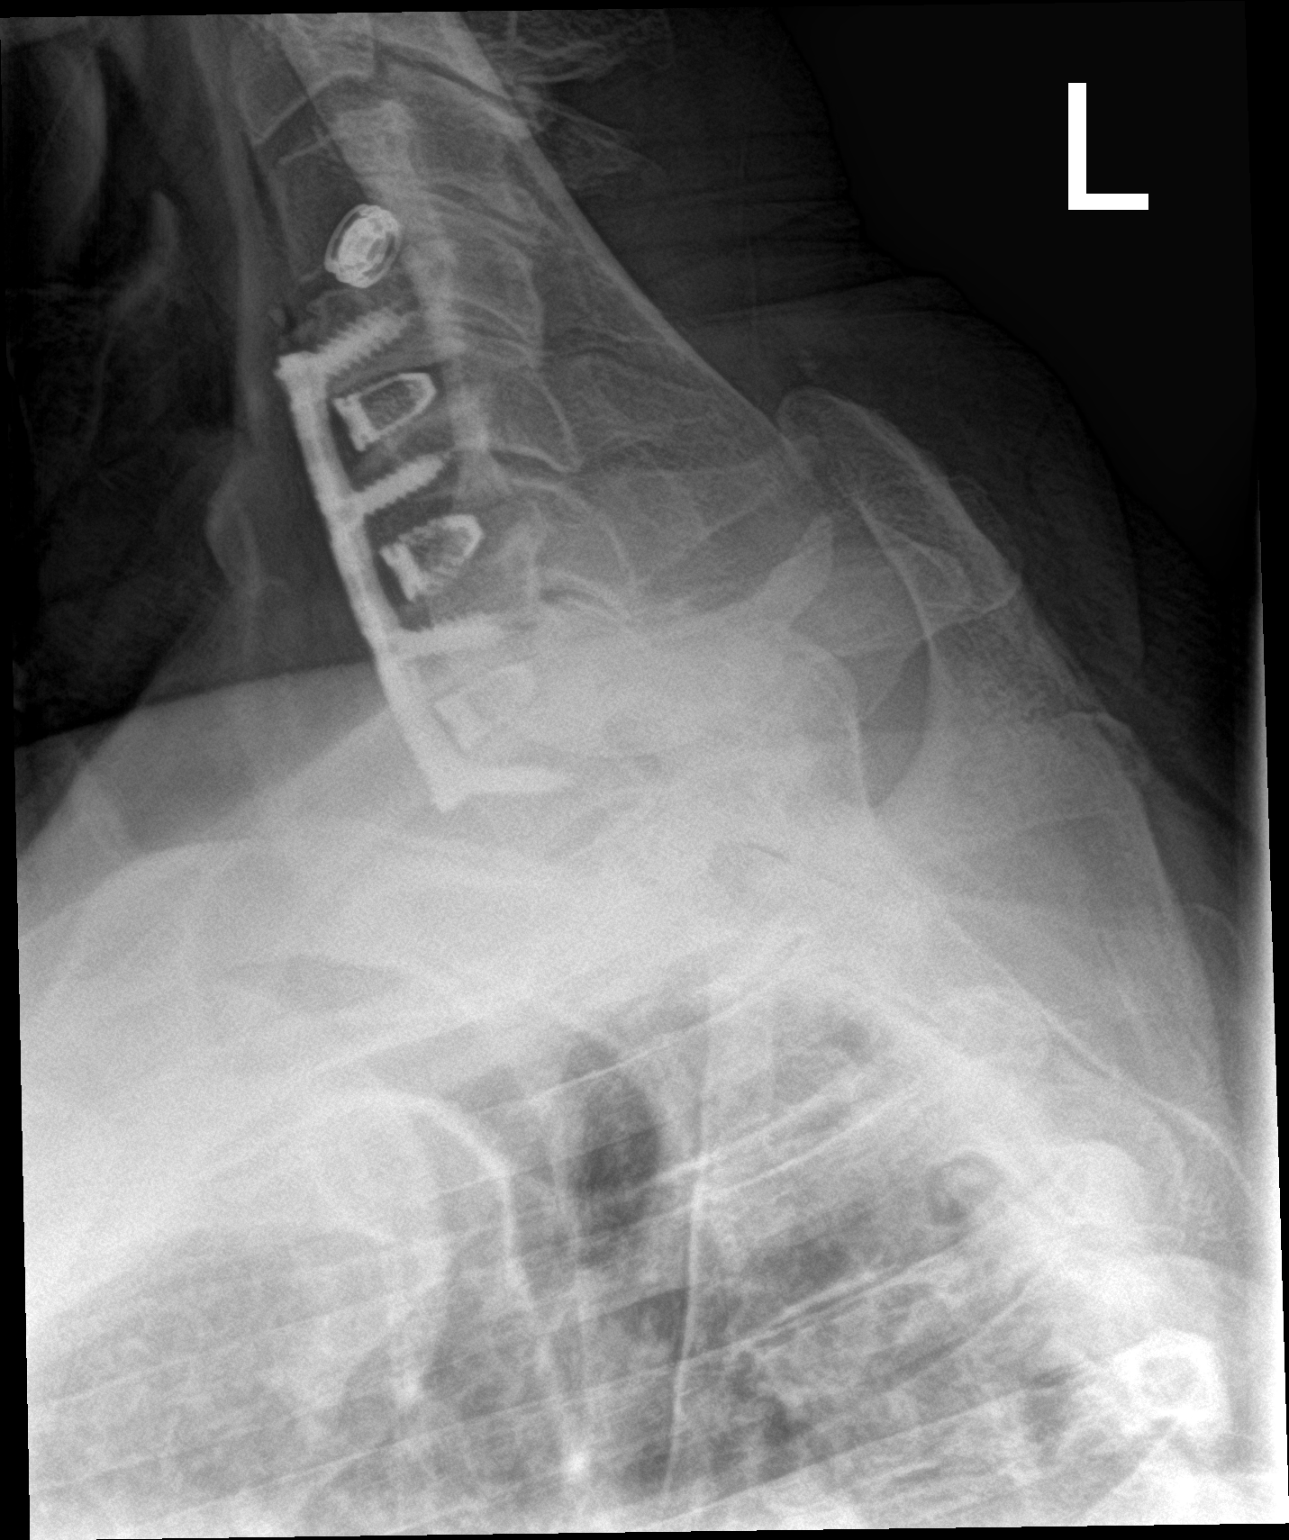

[3 of 3 positions shown; findings below may reference images not displayed]

FINDINGS: Changes of ACDF from C4-C7. Normal alignment. Degenerative disc
disease at C3-4. Diffuse bilateral degenerative facet disease.
IMPRESSION: C4-C7 ACDF.  No visible complicating feature.

## 2019-09-27 SURGERY — ANTERIOR CERVICAL DECOMPRESSION/DISCECTOMY FUSION 3 LEVELS
Anesthesia: General

## 2019-09-27 MED ORDER — ALBUTEROL SULFATE HFA 108 (90 BASE) MCG/ACT IN AERS: 2.0000 | INHALATION_SPRAY | RESPIRATORY_TRACT | Status: DC | PRN

## 2019-09-27 MED ORDER — FENTANYL CITRATE (PF) 100 MCG/2ML IJ SOLN
INTRAMUSCULAR | Status: DC | PRN
  Administered 2019-09-27: 50 ug via INTRAVENOUS

## 2019-09-27 MED ORDER — METHOCARBAMOL 500 MG PO TABS
750.0000 mg | ORAL_TABLET | Freq: Four times a day (QID) | ORAL | Status: DC
  Administered 2019-09-27: 750 mg via ORAL
  Filled 2019-09-27 (×2): qty 2

## 2019-09-27 MED ORDER — HYDRALAZINE HCL 20 MG/ML IJ SOLN: 5.0000 mg | Freq: Four times a day (QID) | INTRAMUSCULAR | Status: DC | PRN

## 2019-09-27 MED ORDER — PANTOPRAZOLE SODIUM 40 MG PO TBEC
40.0000 mg | DELAYED_RELEASE_TABLET | Freq: Every day | ORAL | Status: DC
  Administered 2019-09-28 (×2): 40 mg via ORAL
  Filled 2019-09-27 (×2): qty 1

## 2019-09-27 MED ORDER — SUCCINYLCHOLINE CHLORIDE 200 MG/10ML IV SOSY
PREFILLED_SYRINGE | INTRAVENOUS | Status: AC
  Filled 2019-09-27: qty 10

## 2019-09-27 MED ORDER — ONDANSETRON HCL 4 MG/2ML IJ SOLN
4.0000 mg | Freq: Four times a day (QID) | INTRAMUSCULAR | Status: DC | PRN
  Administered 2019-09-28: 4 mg via INTRAVENOUS
  Filled 2019-09-27: qty 2

## 2019-09-27 MED ORDER — CEFAZOLIN SODIUM-DEXTROSE 2-4 GM/100ML-% IV SOLN
INTRAVENOUS | Status: AC
  Filled 2019-09-27: qty 100

## 2019-09-27 MED ORDER — REMIFENTANIL HCL 1 MG IV SOLR
INTRAVENOUS | Status: DC | PRN
  Administered 2019-09-27: .1 ug/kg/min via INTRAVENOUS
  Administered 2019-09-27 (×2): 50 ug via INTRAVENOUS

## 2019-09-27 MED ORDER — EPINEPHRINE 1 MG/10ML IJ SOSY
PREFILLED_SYRINGE | INTRAMUSCULAR | Status: DC | PRN
Start: 2019-09-27 — End: 2019-09-27
  Administered 2019-09-27 (×3): .005 mg via INTRAVENOUS

## 2019-09-27 MED ORDER — PROPOFOL 10 MG/ML IV BOLUS
INTRAVENOUS | Status: AC
  Filled 2019-09-27: qty 20

## 2019-09-27 MED ORDER — SODIUM CHLORIDE 0.9 % IV SOLN
50.0000 mL/h | INTRAVENOUS | Status: DC
  Administered 2019-09-27 (×2): 50 mL/h via INTRAVENOUS

## 2019-09-27 MED ORDER — GABAPENTIN 100 MG PO CAPS
100.0000 mg | ORAL_CAPSULE | Freq: Three times a day (TID) | ORAL | Status: DC
  Administered 2019-09-27 (×2): 100 mg via ORAL
  Filled 2019-09-27 (×2): qty 1

## 2019-09-27 MED ORDER — ACETAMINOPHEN 10 MG/ML IV SOLN
INTRAVENOUS | Status: AC
  Filled 2019-09-27: qty 100

## 2019-09-27 MED ORDER — ESCITALOPRAM OXALATE 10 MG PO TABS
10.0000 mg | ORAL_TABLET | Freq: Every day | ORAL | Status: DC
  Administered 2019-09-27: 10 mg via ORAL
  Filled 2019-09-27 (×2): qty 1

## 2019-09-27 MED ORDER — ACETAMINOPHEN 10 MG/ML IV SOLN
INTRAVENOUS | Status: DC | PRN
  Administered 2019-09-27: 1000 mg via INTRAVENOUS

## 2019-09-27 MED ORDER — ONDANSETRON HCL 4 MG/2ML IJ SOLN
INTRAMUSCULAR | Status: AC
  Filled 2019-09-27: qty 2

## 2019-09-27 MED ORDER — DEXAMETHASONE SODIUM PHOSPHATE 10 MG/ML IJ SOLN
INTRAMUSCULAR | Status: AC
  Filled 2019-09-27: qty 1

## 2019-09-27 MED ORDER — ALUM & MAG HYDROXIDE-SIMETH 200-200-20 MG/5ML PO SUSP: 30.0000 mL | Freq: Four times a day (QID) | ORAL | Status: DC | PRN

## 2019-09-27 MED ORDER — BUPIVACAINE-EPINEPHRINE 0.5% -1:200000 IJ SOLN
INTRAMUSCULAR | Status: DC | PRN
  Administered 2019-09-27: 5 mL

## 2019-09-27 MED ORDER — HYDROMORPHONE HCL 1 MG/ML IJ SOLN: 0.5000 mg | INTRAMUSCULAR | Status: DC | PRN

## 2019-09-27 MED ORDER — EPHEDRINE SULFATE 50 MG/ML IJ SOLN
INTRAMUSCULAR | Status: DC | PRN
  Administered 2019-09-27: 15 mg via INTRAVENOUS
  Administered 2019-09-27 (×3): 10 mg via INTRAVENOUS
  Administered 2019-09-27: 5 mg via INTRAVENOUS

## 2019-09-27 MED ORDER — SODIUM CHLORIDE 0.9 % IV SOLN: INTRAVENOUS | Status: DC

## 2019-09-27 MED ORDER — PROPOFOL 500 MG/50ML IV EMUL
INTRAVENOUS | Status: DC | PRN
  Administered 2019-09-27: 150 ug/kg/min via INTRAVENOUS

## 2019-09-27 MED ORDER — MENTHOL 3 MG MT LOZG
1.0000 | LOZENGE | OROMUCOSAL | Status: DC | PRN
  Filled 2019-09-27: qty 9

## 2019-09-27 MED ORDER — FLEET ENEMA 7-19 GM/118ML RE ENEM: 1.0000 | ENEMA | Freq: Once | RECTAL | Status: DC | PRN

## 2019-09-27 MED ORDER — FENTANYL CITRATE (PF) 100 MCG/2ML IJ SOLN
INTRAMUSCULAR | Status: AC
  Filled 2019-09-27: qty 2

## 2019-09-27 MED ORDER — OXYCODONE HCL 5 MG PO TABS
5.0000 mg | ORAL_TABLET | ORAL | Status: DC | PRN
  Administered 2019-09-27 – 2019-09-28 (×2): 5 mg via ORAL
  Filled 2019-09-27 (×2): qty 1

## 2019-09-27 MED ORDER — ALBUTEROL SULFATE (2.5 MG/3ML) 0.083% IN NEBU: 2.5000 mg | INHALATION_SOLUTION | RESPIRATORY_TRACT | Status: DC | PRN

## 2019-09-27 MED ORDER — THROMBIN 20000 UNITS EX KIT
PACK | CUTANEOUS | Status: AC
  Filled 2019-09-27: qty 2

## 2019-09-27 MED ORDER — LIDOCAINE HCL (PF) 2 % IJ SOLN
INTRAMUSCULAR | Status: AC
  Filled 2019-09-27: qty 5

## 2019-09-27 MED ORDER — PROPOFOL 10 MG/ML IV BOLUS
INTRAVENOUS | Status: DC | PRN
  Administered 2019-09-27: 20 mg via INTRAVENOUS
  Administered 2019-09-27: 30 mg via INTRAVENOUS
  Administered 2019-09-27: 150 mg via INTRAVENOUS

## 2019-09-27 MED ORDER — SODIUM CHLORIDE FLUSH 0.9 % IV SOLN
INTRAVENOUS | Status: AC
  Filled 2019-09-27: qty 10

## 2019-09-27 MED ORDER — ACETAMINOPHEN 500 MG PO TABS
1000.0000 mg | ORAL_TABLET | Freq: Four times a day (QID) | ORAL | Status: DC
  Administered 2019-09-27 – 2019-09-28 (×3): 1000 mg via ORAL
  Filled 2019-09-27 (×3): qty 2

## 2019-09-27 MED ORDER — ONDANSETRON HCL 4 MG/2ML IJ SOLN: 4.0000 mg | Freq: Once | INTRAMUSCULAR | Status: DC | PRN

## 2019-09-27 MED ORDER — IPRATROPIUM-ALBUTEROL 0.5-2.5 (3) MG/3ML IN SOLN: 3.0000 mL | Freq: Four times a day (QID) | RESPIRATORY_TRACT | Status: DC | PRN

## 2019-09-27 MED ORDER — PROPOFOL 500 MG/50ML IV EMUL
INTRAVENOUS | Status: AC
  Filled 2019-09-27: qty 50

## 2019-09-27 MED ORDER — POLYETHYLENE GLYCOL 3350 17 G PO PACK: 17.0000 g | PACK | Freq: Every day | ORAL | Status: DC | PRN

## 2019-09-27 MED ORDER — REMIFENTANIL HCL 1 MG IV SOLR: INTRAVENOUS | Status: DC | PRN

## 2019-09-27 MED ORDER — GABAPENTIN 100 MG PO CAPS
100.0000 mg | ORAL_CAPSULE | Freq: Two times a day (BID) | ORAL | Status: DC
  Administered 2019-09-28: 100 mg via ORAL
  Filled 2019-09-27 (×2): qty 1

## 2019-09-27 MED ORDER — CEFAZOLIN SODIUM-DEXTROSE 2-4 GM/100ML-% IV SOLN
2.0000 g | Freq: Once | INTRAVENOUS | Status: AC
  Administered 2019-09-27: 2 g via INTRAVENOUS

## 2019-09-27 MED ORDER — ONDANSETRON HCL 4 MG/2ML IJ SOLN
INTRAMUSCULAR | Status: DC | PRN
  Administered 2019-09-27: 4 mg via INTRAVENOUS

## 2019-09-27 MED ORDER — SODIUM CHLORIDE 0.9 % IV SOLN: INTRAVENOUS | Status: DC | PRN

## 2019-09-27 MED ORDER — METHOCARBAMOL 1000 MG/10ML IJ SOLN
500.0000 mg | Freq: Four times a day (QID) | INTRAVENOUS | Status: DC
  Administered 2019-09-27: 500 mg via INTRAVENOUS
  Filled 2019-09-27 (×7): qty 5

## 2019-09-27 MED ORDER — THROMBIN 5000 UNITS EX SOLR
CUTANEOUS | Status: DC | PRN
  Administered 2019-09-27: 5000 [IU] via TOPICAL

## 2019-09-27 MED ORDER — BISACODYL 5 MG PO TBEC: 5.0000 mg | DELAYED_RELEASE_TABLET | Freq: Every day | ORAL | Status: DC | PRN

## 2019-09-27 MED ORDER — CHLORHEXIDINE GLUCONATE 0.12 % MT SOLN
OROMUCOSAL | Status: AC
  Administered 2019-09-27: 15 mL via OROMUCOSAL
  Filled 2019-09-27: qty 15

## 2019-09-27 MED ORDER — PROPOFOL 500 MG/50ML IV EMUL
INTRAVENOUS | Status: AC
  Filled 2019-09-27: qty 100

## 2019-09-27 MED ORDER — REMIFENTANIL HCL 1 MG IV SOLR
INTRAVENOUS | Status: AC
  Filled 2019-09-27: qty 1000

## 2019-09-27 MED ORDER — DEXAMETHASONE SODIUM PHOSPHATE 10 MG/ML IJ SOLN
INTRAMUSCULAR | Status: DC | PRN
  Administered 2019-09-27: 10 mg via INTRAVENOUS

## 2019-09-27 MED ORDER — PHENYLEPHRINE HCL (PRESSORS) 10 MG/ML IV SOLN
INTRAVENOUS | Status: DC | PRN
  Administered 2019-09-27 (×2): 100 ug via INTRAVENOUS

## 2019-09-27 MED ORDER — BACITRACIN 50000 UNITS IM SOLR
INTRAMUSCULAR | Status: AC
  Filled 2019-09-27: qty 1

## 2019-09-27 MED ORDER — MOMETASONE FURO-FORMOTEROL FUM 100-5 MCG/ACT IN AERO
2.0000 | INHALATION_SPRAY | Freq: Two times a day (BID) | RESPIRATORY_TRACT | Status: DC
  Filled 2019-09-27: qty 8.8

## 2019-09-27 MED ORDER — ORAL CARE MOUTH RINSE: 15.0000 mL | Freq: Once | OROMUCOSAL | Status: AC

## 2019-09-27 MED ORDER — INSULIN ASPART 100 UNIT/ML ~~LOC~~ SOLN
0.0000 [IU] | Freq: Three times a day (TID) | SUBCUTANEOUS | Status: DC
  Administered 2019-09-27: 3 [IU] via SUBCUTANEOUS
  Administered 2019-09-28: 2 [IU] via SUBCUTANEOUS
  Filled 2019-09-27 (×2): qty 1

## 2019-09-27 MED ORDER — EPHEDRINE 5 MG/ML INJ
INTRAVENOUS | Status: AC
  Filled 2019-09-27: qty 10

## 2019-09-27 MED ORDER — GLYCOPYRROLATE 0.2 MG/ML IJ SOLN
INTRAMUSCULAR | Status: DC | PRN
Start: 2019-09-27 — End: 2019-09-27
  Administered 2019-09-27 (×2): .1 mg via INTRAVENOUS

## 2019-09-27 MED ORDER — THROMBIN 5000 UNITS EX SOLR
CUTANEOUS | Status: AC
  Filled 2019-09-27: qty 10000

## 2019-09-27 MED ORDER — SODIUM CHLORIDE 0.9% FLUSH: 3.0000 mL | INTRAVENOUS | Status: DC | PRN

## 2019-09-27 MED ORDER — PHENYLEPHRINE HCL (PRESSORS) 10 MG/ML IV SOLN
INTRAVENOUS | Status: AC
  Filled 2019-09-27: qty 1

## 2019-09-27 MED ORDER — BUPIVACAINE-EPINEPHRINE (PF) 0.5% -1:200000 IJ SOLN
INTRAMUSCULAR | Status: AC
  Filled 2019-09-27: qty 30

## 2019-09-27 MED ORDER — ENOXAPARIN SODIUM 40 MG/0.4ML ~~LOC~~ SOLN
40.0000 mg | SUBCUTANEOUS | Status: DC
  Administered 2019-09-28: 40 mg via SUBCUTANEOUS
  Filled 2019-09-27: qty 0.4

## 2019-09-27 MED ORDER — ONDANSETRON HCL 4 MG PO TABS: 4.0000 mg | ORAL_TABLET | Freq: Four times a day (QID) | ORAL | Status: DC | PRN

## 2019-09-27 MED ORDER — DOCUSATE SODIUM 100 MG PO CAPS
100.0000 mg | ORAL_CAPSULE | Freq: Two times a day (BID) | ORAL | Status: DC
  Administered 2019-09-27 – 2019-09-28 (×2): 100 mg via ORAL
  Filled 2019-09-27 (×2): qty 1

## 2019-09-27 MED ORDER — OXYCODONE HCL 5 MG PO TABS
10.0000 mg | ORAL_TABLET | ORAL | Status: DC | PRN
  Administered 2019-09-27: 10 mg via ORAL
  Filled 2019-09-27 (×2): qty 2

## 2019-09-27 MED ORDER — FENTANYL CITRATE (PF) 100 MCG/2ML IJ SOLN
25.0000 ug | INTRAMUSCULAR | Status: DC | PRN
  Administered 2019-09-27 (×4): 25 ug via INTRAVENOUS

## 2019-09-27 MED ORDER — SODIUM CHLORIDE 0.9% FLUSH
3.0000 mL | Freq: Two times a day (BID) | INTRAVENOUS | Status: DC
  Administered 2019-09-27 – 2019-09-28 (×2): 3 mL via INTRAVENOUS

## 2019-09-27 MED ORDER — LOSARTAN POTASSIUM 50 MG PO TABS
100.0000 mg | ORAL_TABLET | Freq: Every day | ORAL | Status: DC
  Administered 2019-09-28: 100 mg via ORAL
  Filled 2019-09-27: qty 2

## 2019-09-27 MED ORDER — FENTANYL CITRATE (PF) 100 MCG/2ML IJ SOLN
INTRAMUSCULAR | Status: AC
  Administered 2019-09-27: 25 ug via INTRAVENOUS
  Filled 2019-09-27: qty 2

## 2019-09-27 MED ORDER — CHLORHEXIDINE GLUCONATE 0.12 % MT SOLN: 15.0000 mL | Freq: Once | OROMUCOSAL | Status: AC

## 2019-09-27 MED ORDER — PANTOPRAZOLE SODIUM 40 MG PO TBEC: 40.0000 mg | DELAYED_RELEASE_TABLET | Freq: Every day | ORAL | Status: DC

## 2019-09-27 MED ORDER — PHENOL 1.4 % MT LIQD
1.0000 | OROMUCOSAL | Status: DC | PRN
  Filled 2019-09-27: qty 177

## 2019-09-27 MED ORDER — SODIUM CHLORIDE 0.9 % IV SOLN
INTRAVENOUS | Status: DC | PRN
  Administered 2019-09-27: 24 mL/h via INTRAVENOUS

## 2019-09-27 MED ORDER — SENNA 8.6 MG PO TABS
2.0000 | ORAL_TABLET | Freq: Two times a day (BID) | ORAL | Status: DC
  Administered 2019-09-27 – 2019-09-28 (×2): 17.2 mg via ORAL
  Filled 2019-09-27 (×2): qty 2

## 2019-09-27 MED ORDER — SODIUM CHLORIDE 0.9 % IR SOLN
Status: DC | PRN
  Administered 2019-09-27: 1000 mL

## 2019-09-27 SURGICAL SUPPLY — 57 items
BULB RESERV EVAC DRAIN JP 100C (MISCELLANEOUS) IMPLANT
BUR NEURO DRILL SOFT 3.0X3.8M (BURR) ×3 IMPLANT
CANISTER SUCT 1200ML W/VALVE (MISCELLANEOUS) ×6 IMPLANT
CHLORAPREP W/TINT 26 (MISCELLANEOUS) ×3 IMPLANT
CLOSURE WOUND 1/2 X4 (GAUZE/BANDAGES/DRESSINGS)
COUNTER NEEDLE 20/40 LG (NEEDLE) ×3 IMPLANT
COVER LIGHT HANDLE STERIS (MISCELLANEOUS) ×6 IMPLANT
COVER WAND RF STERILE (DRAPES) ×3 IMPLANT
DERMABOND ADVANCED (GAUZE/BANDAGES/DRESSINGS) ×2
DERMABOND ADVANCED .7 DNX12 (GAUZE/BANDAGES/DRESSINGS) ×1 IMPLANT
DRAIN CHANNEL JP 10F RND 20C F (MISCELLANEOUS) IMPLANT
DRAPE C-ARM 42X72 X-RAY (DRAPES) ×6 IMPLANT
DRAPE LAPAROTOMY 77X122 PED (DRAPES) ×3 IMPLANT
DRAPE MICROSCOPE SPINE 48X150 (DRAPES) ×3 IMPLANT
DRAPE SURG 17X11 SM STRL (DRAPES) ×12 IMPLANT
ELECT CAUTERY BLADE TIP 2.5 (TIP) ×3
ELECTRODE CAUTERY BLDE TIP 2.5 (TIP) ×1 IMPLANT
FEE INTRAOP MONITOR IMPULS NCS (MISCELLANEOUS) IMPLANT
FRAME EYE SHIELD (PROTECTIVE WEAR) ×3 IMPLANT
GAUZE SPONGE 4X4 12PLY STRL (GAUZE/BANDAGES/DRESSINGS) ×3 IMPLANT
GLOVE SURG SYN 7.0 (GLOVE) ×6 IMPLANT
GLOVE SURG SYN 8.5  E (GLOVE) ×6
GLOVE SURG SYN 8.5 E (GLOVE) ×3 IMPLANT
GOWN SRG XL LVL 3 NONREINFORCE (GOWNS) ×1 IMPLANT
GOWN STRL NON-REIN TWL XL LVL3 (GOWNS) ×2
GOWN STRL REUS W/ TWL LRG LVL3 (GOWN DISPOSABLE) ×1 IMPLANT
GOWN STRL REUS W/ TWL XL LVL3 (GOWN DISPOSABLE) ×1 IMPLANT
GOWN STRL REUS W/TWL LRG LVL3 (GOWN DISPOSABLE) ×2
GOWN STRL REUS W/TWL XL LVL3 (GOWN DISPOSABLE) ×2
GRADUATE 1200CC STRL 31836 (MISCELLANEOUS) ×3 IMPLANT
INTRAOP MONITOR FEE IMPULS NCS (MISCELLANEOUS)
INTRAOP MONITOR FEE IMPULSE (MISCELLANEOUS)
KIT TURNOVER KIT A (KITS) ×3 IMPLANT
MARKER SKIN DUAL TIP RULER LAB (MISCELLANEOUS) ×6 IMPLANT
NEEDLE HYPO 22GX1.5 SAFETY (NEEDLE) ×3 IMPLANT
NS IRRIG 1000ML POUR BTL (IV SOLUTION) ×3 IMPLANT
PACK LAMINECTOMY NEURO (CUSTOM PROCEDURE TRAY) ×3 IMPLANT
PAD ARMBOARD 7.5X6 YLW CONV (MISCELLANEOUS) ×3 IMPLANT
PIN CASPAR 14 (PIN) ×1 IMPLANT
PIN CASPAR 14MM (PIN) ×3
PLATE ANT CERV XTEND 3 LV 51 (Plate) ×3 IMPLANT
PUTTY DBX 5CC (Putty) ×3 IMPLANT
SCREW VAR 4.2 XD SELF DRILL 16 (Screw) ×24 IMPLANT
SPACER C HEDRON 12X14 7M 7D (Spacer) ×9 IMPLANT
SPOGE SURGIFLO 8M (HEMOSTASIS) ×2
SPONGE KITTNER 5P (MISCELLANEOUS) ×3 IMPLANT
SPONGE SURGIFLO 8M (HEMOSTASIS) ×1 IMPLANT
STAPLER SKIN PROX 35W (STAPLE) IMPLANT
STRIP CLOSURE SKIN 1/2X4 (GAUZE/BANDAGES/DRESSINGS) IMPLANT
SUT V-LOC 90 ABS DVC 3-0 CL (SUTURE) ×3 IMPLANT
SUT VIC AB 3-0 SH 8-18 (SUTURE) ×3 IMPLANT
SYR 30ML LL (SYRINGE) ×3 IMPLANT
TAPE CLOTH 3X10 WHT NS LF (GAUZE/BANDAGES/DRESSINGS) ×3 IMPLANT
TOWEL OR 17X26 4PK STRL BLUE (TOWEL DISPOSABLE) ×12 IMPLANT
TRAY FOLEY MTR SLVR 16FR STAT (SET/KITS/TRAYS/PACK) IMPLANT
TUBING CONNECTING 10 (TUBING) ×2 IMPLANT
TUBING CONNECTING 10' (TUBING) ×1

## 2019-09-27 NOTE — Anesthesia Procedure Notes (Signed)
Procedure Name: Intubation Date/Time: 09/27/2019 7:27 AM Performed by: Debe Coder, CRNA Pre-anesthesia Checklist: Patient identified, Emergency Drugs available, Suction available and Patient being monitored Patient Re-evaluated:Patient Re-evaluated prior to induction Oxygen Delivery Method: Circle system utilized Preoxygenation: Pre-oxygenation with 100% oxygen Induction Type: IV induction Ventilation: Mask ventilation without difficulty Laryngoscope Size: Mac and 4 Grade View: Grade I Tube type: Oral Tube size: 8.0 mm Number of attempts: 1 Airway Equipment and Method: Stylet and Oral airway Placement Confirmation: ETT inserted through vocal cords under direct vision,  positive ETCO2 and breath sounds checked- equal and bilateral Secured at: 22 cm Tube secured with: Tape Dental Injury: Teeth and Oropharynx as per pre-operative assessment

## 2019-09-27 NOTE — Op Note (Signed)
Indications: Mr. Chavarin is a 74 yo male who presented with cervical radiculopathy with worsening symptoms, prompting surgical intervention.  Findings: severe foraminal stenosis  Preoperative Diagnosis: Cervical radiculopathy Postoperative Diagnosis: same   EBL: 50 ml IVF: 1000 ml Drains: none Disposition: Extubated and Stable to PACU Complications: none  No foley catheter was placed.   Preoperative Note:   Risks of surgery discussed include: infection, bleeding, stroke, coma, death, paralysis, CSF leak, nerve/spinal cord injury, numbness, tingling, weakness, complex regional pain syndrome, recurrent stenosis and/or disc herniation, vascular injury, development of instability, neck/back pain, need for further surgery, persistent symptoms, development of deformity, and the risks of anesthesia. The patient understood these risks and agreed to proceed.  Operative Note:   Operative Procedure: 1. Anterior Cervical Discectomy C4-5 including bilateral foraminotomies and end plate preparation  2. Anterior Cervical Discectomy C5-6 including bilateral foraminotomies and end plate preparation  3. Anterior Cervical Discectomy C6-7 including bilateral foraminotomies and end plate preparation 4. Anterior Spinal Instrumentation C4 to 7 using Globus XTend 5. Anterior arthrodesis from C4 to C7 with placement of biomechanical devices at C4-5, C5-6, and C6-7 6. Use of the operative microscope 7. Use of intraoperative flouroscopy  PROCEDURE IN DETAIL: After obtaining informed consent, the patient taken to the operating room, placed in supine position, general anesthesia induced.  The patient had a small shoulder roll placed behind their shoulders.  The patient received preop antibiotics and IV Decadron.  The patient had a neck incision outlined, was prepped and draped in usual sterile fashion. The incision was injected with local anesthetic.   An incision was opened, dissection taken down medial to  the carotid artery and jugular vein, lateral to the trachea and esophagus.  The prevertebral fascia identified and a localizing x-ray demonstrated the correct level.  The longus colli were dissected laterally, and self-retaining retractors placed to open the operative field. The microscope was then brought into the field.  With this complete, distractor pins were placed in the vertebral bodies of C4, C6, and C7. The distractor was placed from C4-6, and the anuli at C4-5 and C5-6 were opened using a bovie.  Curettes and pituitary rongeurs used to remove the majority of disk, then the drill was used to remove the posterior osteophyte and begin the foraminotomies. The nerve hook was used to elevate the posterior longitudinal ligament, which was then removed with Kerrison rongeurs. The microblunt nerve hook could be passed out the foramen bilateral at each level.   Meticulous hemostasis obtained. A biomechanical device (Globus Xtend 7 mm height x 14 mm width by 12 mm depth) was placed at C4/5. A second biomechanical device (Globus Xtend 7 mm height x 14 mm width by 12 mm depth) was placed at C5/6. Each device had been filled with allograft for aid in arthrodesis.  The caspar distractor was removed and placed at C6-7. The pin at C4 was removed and bone wax used for hemostasis. The C6-7 disc was opened with the bovie. Curettes and pituitary rongeurs used to remove the majority of disk, then the drill was used to remove the posterior osteophyte and begin the foraminotomies. The nerve hook was used to elevate the posterior longitudinal ligament, which was then removed with Kerrison rongeurs. The microblunt nerve hook could be passed out the foramen bilateral at each level.   Meticulous hemostasis obtained.  A biomechanical device (Globus Xtend 7 mm height x 14 mm width by 12 mm depth) was placed at C6/7.    A four segment, three  level plate (51 mm Globus Xtend) was chosen.  Two screws placed in the vertebral bodies  of all four segments, respectively making sure the screws were behind the locking mechanism.  Final AP and lateral radiographs were taken.   With everything in good position, the wound was irrigated copiously with bacitracin-containing solution and meticulous hemostasis obtained.  Wound was closed in 2 layers using interrupted inverted 3-0 Vicryl sutures.  The wound was dressed with dermabond, the head of bed at 30 degrees, taken to recovery room in stable condition.  No new postop neurological deficits were identified..  All counts were correct at the end of the case.  I performed the entire procedure with the assistance of Lonell Face NP as an Pensions consultant.  Meade Maw MD

## 2019-09-27 NOTE — Progress Notes (Signed)
Procedure: ACDF C4-C7 Procedure date: 09/27/2019 Diagnosis: cervical radiculopathy    History: Zachary HERRINGTON Sr. is s/p ACDF C4-C7 for cervical radiculopathy POD0: Tolerated procedure well. Evaluated in post op recovery still disoriented from anesthesia but able to answer questions and obey commands.   Physical Exam: Vitals:   09/27/19 0613 09/27/19 1035  BP: 128/81 (!) (P) 153/87  Pulse: 65   Resp: 16   Temp: (!) 96.8 F (36 C) (!) (P) 96.7 F (35.9 C)  SpO2: 95%     General: Alert and oriented, sitting in bed Strength:5/5 throughout  Sensation: intact and symmetric throughout  Skin: incision over anterior neck is clean, dry, intact  Data:  Recent Labs  Lab 09/22/19 1226  NA 137  K 4.3  CL 104  CO2 24  BUN 20  CREATININE 1.10  GLUCOSE 121*  CALCIUM 8.9   No results for input(s): AST, ALT, ALKPHOS in the last 168 hours.  Invalid input(s): TBILI   Recent Labs  Lab 09/22/19 1226  WBC 8.8  HGB 14.9  HCT 41.8  PLT 209   Recent Labs  Lab 09/22/19 1226  APTT 31  INR 0.9         Assessment/Plan:  Zachary Fear Sr. is POD0 s/p ACDF C4-C7.  He will be admitted overnight for monitoring.    #ACDF C4-C7 - mobilize - pain control - NO PTOT indicated - Aspen collar when OOB - Imaging when tolerated  #DVT Prophylaxis - DVT prophylaxis: SCDs - Will start Lovenox tomorrow  #DM2 - SSI with ACHS BS checks  COPD: - Continue home medications   Lonell Face, NP Department of Neurosurgery

## 2019-09-27 NOTE — Transfer of Care (Signed)
Immediate Anesthesia Transfer of Care Note  Patient: Zachary Fear Sr.  Procedure(s) Performed: ANTERIOR CERVICAL DECOMPRESSION/DISCECTOMY FUSION 3 LEVELS C4-7 (N/A )  Patient Location: PACU  Anesthesia Type:General  Level of Consciousness: awake, alert  and oriented  Airway & Oxygen Therapy: Patient Spontanous Breathing and Patient connected to face mask oxygen  Post-op Assessment: Report given to RN and Post -op Vital signs reviewed and stable  Post vital signs: Reviewed and stable  Last Vitals:  Vitals Value Taken Time  BP 153/87 09/27/19 1035  Temp    Pulse 80 09/27/19 1041  Resp 19 09/27/19 1041  SpO2 96 % 09/27/19 1041  Vitals shown include unvalidated device data.  Last Pain:  Vitals:   09/27/19 1035  TempSrc:   PainSc: (P) Asleep         Complications: No complications documented.

## 2019-09-27 NOTE — Progress Notes (Signed)
Pt arrived via bed from PACU, report received from Lysle Dingwall, awake and Ox3, VSS  Ambulated to BR without difficulty,  voided 700 ml clear yellow urine, pt stated neck soreness 3-4. Lunch ordered and pt ate all of his lunch. Wife with pt. Pt being transferred to room 149 via bed.

## 2019-09-27 NOTE — Anesthesia Postprocedure Evaluation (Signed)
Anesthesia Post Note  Patient: Zachary SCHROEPFER Sr.  Procedure(s) Performed: ANTERIOR CERVICAL DECOMPRESSION/DISCECTOMY FUSION 3 LEVELS C4-7 (N/A )  Patient location during evaluation: PACU Anesthesia Type: General Level of consciousness: awake and alert Pain management: pain level controlled Vital Signs Assessment: post-procedure vital signs reviewed and stable Respiratory status: spontaneous breathing and respiratory function stable Cardiovascular status: stable Anesthetic complications: no   No complications documented.   Last Vitals:  Vitals:   09/27/19 1157 09/27/19 1217  BP:  (!) 158/80  Pulse:    Resp:    Temp:  (!) 36.4 C  SpO2: 93%     Last Pain:  Vitals:   09/27/19 1217  TempSrc:   PainSc: 3                  Margeart Allender K

## 2019-09-27 NOTE — Anesthesia Preprocedure Evaluation (Signed)
Anesthesia Evaluation  Patient identified by MRN, date of birth, ID band Patient awake    Reviewed: Allergy & Precautions, NPO status , Patient's Chart, lab work & pertinent test results  History of Anesthesia Complications Negative for: history of anesthetic complications  Airway Mallampati: II       Dental   Pulmonary neg sleep apnea, COPD,  COPD inhaler, Not current smoker, former smoker,           Cardiovascular hypertension, Pt. on medications + Past MI and + CABG  (-) CHF (-) dysrhythmias (-) Valvular Problems/Murmurs     Neuro/Psych neg Seizures Anxiety    GI/Hepatic Neg liver ROS, GERD  Medicated and Controlled,  Endo/Other  diabetes (borderline, currently off meds), Type 2  Renal/GU negative Renal ROS     Musculoskeletal   Abdominal   Peds  Hematology   Anesthesia Other Findings   Reproductive/Obstetrics                             Anesthesia Physical Anesthesia Plan  ASA: III  Anesthesia Plan: General   Post-op Pain Management:    Induction: Intravenous  PONV Risk Score and Plan: 2 and Ondansetron and Dexamethasone  Airway Management Planned: Oral ETT  Additional Equipment:   Intra-op Plan:   Post-operative Plan:   Informed Consent: I have reviewed the patients History and Physical, chart, labs and discussed the procedure including the risks, benefits and alternatives for the proposed anesthesia with the patient or authorized representative who has indicated his/her understanding and acceptance.       Plan Discussed with:   Anesthesia Plan Comments:         Anesthesia Quick Evaluation

## 2019-09-27 NOTE — H&P (Signed)
  I have reviewed and confirmed my history and physical from 09/13/2019 with no additions or changes. Plan for C4-7 ACDF.  Risks and benefits reviewed.    Heart sounds normal no MRG. Chest Clear to Auscultation Bilaterally.

## 2019-09-28 DIAGNOSIS — M5412 Radiculopathy, cervical region: Secondary | ICD-10-CM | POA: Diagnosis not present

## 2019-09-28 LAB — GLUCOSE, CAPILLARY
Glucose-Capillary: 127 mg/dL — ABNORMAL HIGH (ref 70–99)
Glucose-Capillary: 127 mg/dL — ABNORMAL HIGH (ref 70–99)

## 2019-09-28 MED ORDER — BISACODYL 10 MG RE SUPP
10.0000 mg | Freq: Once | RECTAL | Status: AC
  Administered 2019-09-28: 10 mg via RECTAL
  Filled 2019-09-28: qty 1

## 2019-09-28 MED ORDER — MENTHOL 3 MG MT LOZG: 1.0000 | LOZENGE | OROMUCOSAL | 12 refills | Status: AC | PRN

## 2019-09-28 MED ORDER — METHOCARBAMOL 750 MG PO TABS: 750.0000 mg | ORAL_TABLET | Freq: Four times a day (QID) | ORAL | 0 refills | Status: DC

## 2019-09-28 MED ORDER — OXYCODONE HCL 5 MG PO TABS: 5.0000 mg | ORAL_TABLET | ORAL | 0 refills | Status: DC | PRN

## 2019-09-28 MED ORDER — ACETAMINOPHEN 500 MG PO TABS: 1000.0000 mg | ORAL_TABLET | Freq: Four times a day (QID) | ORAL | 0 refills | Status: AC

## 2019-09-28 MED ORDER — GABAPENTIN 100 MG PO CAPS: 100.0000 mg | ORAL_CAPSULE | Freq: Two times a day (BID) | ORAL | 0 refills | Status: DC

## 2019-09-28 MED ORDER — ONDANSETRON HCL 4 MG PO TABS: 4.0000 mg | ORAL_TABLET | Freq: Four times a day (QID) | ORAL | 0 refills | Status: AC | PRN

## 2019-09-28 MED ORDER — PHENOL 1.4 % MT LIQD: 1.0000 | OROMUCOSAL | 0 refills | Status: AC | PRN

## 2019-09-28 MED ORDER — BISACODYL 5 MG PO TBEC: 5.0000 mg | DELAYED_RELEASE_TABLET | Freq: Every day | ORAL | 0 refills | Status: AC | PRN

## 2019-09-28 MED ORDER — SENNA 8.6 MG PO TABS: 2.0000 | ORAL_TABLET | Freq: Two times a day (BID) | ORAL | 0 refills | Status: AC

## 2019-09-28 MED ORDER — ALUM & MAG HYDROXIDE-SIMETH 200-200-20 MG/5ML PO SUSP: 30.0000 mL | Freq: Four times a day (QID) | ORAL | 0 refills | Status: AC | PRN

## 2019-09-28 NOTE — Progress Notes (Signed)
Patient discharged with discharge instructions, personal belongings. His [facility]

## 2019-09-28 NOTE — Discharge Summary (Signed)
Procedure: ACDF C4-C7 Procedure date: 09/27/2019 Diagnosis: cervical radiculopathy    History: Zachary KAUFHOLD Sr. is s/p ACDF C4-C7 for cervical radiculopathy POD1: Patient seen this morning, has tolerated some food and blood sugars have improved. Ambulating without assistive devices. Eager for discharge.  POD0: Tolerated procedure well. Evaluated in post op recovery still disoriented from anesthesia but able to answer questions and obey commands.   Physical Exam: Vitals:   09/28/19 0736 09/28/19 1053  BP: (!) 158/77 139/71  Pulse: 60 90  Resp: 17 18  Temp: 97.8 F (36.6 C) 98.4 F (36.9 C)  SpO2: 94% 95%    General: Alert and oriented, sitting in bed Strength:5/5 throughout  Sensation: intact and symmetric throughout  Skin: incision over anterior neck is clean, dry, intact  Data:  Recent Labs  Lab 09/27/19 1410  CREATININE 1.28*   No results for input(s): AST, ALT, ALKPHOS in the last 168 hours.  Invalid input(s): TBILI   Recent Labs  Lab 09/27/19 1410  WBC 13.5*  HGB 14.5  HCT 42.0  PLT 196   No results for input(s): APTT, INR in the last 168 hours.        Assessment/Plan:  Zachary Fear Sr. is POD1 s/p ACDF C4-C7.  His pain is well controlled and his preoperative symptoms have improved.  He is cleared for discharge to home.   He will follow up in two weeks with me in clinic.      Lonell Face, NP Department of Neurosurgery

## 2019-09-28 NOTE — Progress Notes (Signed)
Pt complained of left hand numbness and tingling. Stated that he was experiencing the same before surgery. Patient said that the feeling had gone away right after surgery but it suddenly come back. Dr. Izora Ribas notified, no new orders. He was also notified of patients blood pressure of 1792/ 81.

## 2019-09-28 NOTE — Discharge Instructions (Signed)
Please hold your Aspirin for 7 days after surgery  Your surgeon has performed an operation on your cervical spine (neck) to relieve pressure on the spinal cord and/or nerves. This involved making an incision in the front of your neck and removing one or more of the discs that support your spine. Next, a small piece of bone, a titanium plate, and screws were used to fuse two or more of the vertebrae (bones) together.  The following are instructions to help in your recovery once you have been discharged from the hospital. Even if you feel well, it is important that you follow these activity guidelines. If you do not let your neck heal properly from the surgery, you can increase the chance of return of your symptoms and other complications.  * Do not take anti-inflammatory medications for 3 months after surgery (naproxen [Aleve], ibuprofen [Advil, Motrin], etc.). These medications can prevent your bones from healing properly.  Activity    No bending, lifting, or twisting ("BLT"). Avoid lifting objects heavier than 10 pounds (gallon milk jug).  Where possible, avoid household activities that involve lifting, bending, reaching, pushing, or pulling such as laundry, vacuuming, grocery shopping, and childcare. Try to arrange for help from friends and family for these activities while your back heals.  Increase physical activity slowly as tolerated.  Taking short walks is encouraged, but avoid strenuous exercise. Do not jog, run, bicycle, lift weights, or participate in any other exercises unless specifically allowed by your doctor.  Talk to your doctor before resuming sexual activity.  You should not drive until cleared by your doctor.  Until released by your doctor, you should not return to work or school.  You should rest at home and let your body heal.   You may shower three days after your surgery.  After showering, lightly dab your incision dry. Do not take a tub bath or go swimming until approved  by your doctor at your follow-up appointment.  If your doctor ordered a cervical collar (neck brace) for you, you should wear it whenever you are out of bed. You may remove it when lying down or sleeping, but you should wear it at all other times. Not all neck surgeries require a cervical collar.  If you smoke, we strongly recommend that you quit.  Smoking has been proven to interfere with normal bone healing and will dramatically reduce the success rate of your surgery. Please contact [facility] (800-QUIT-NOW) and use the resources at www.[facility].com for assistance in stopping smoking.  Surgical Incision   You have Dermabond glue on your incision. The steri-strips/glue should begin to peel away within about a week (it is fine if the steri-strips fall off before then). If the strips are still in place one week after your surgery, you may gently remove them.  Diet           You may return to your usual diet. However, you may experience discomfort when swallowing in the first month after your surgery. This is normal. You may find that softer foods are more comfortable for you to swallow. Be sure to stay hydrated.  When to Contact us  You may experience pain in your neck and/or pain between your shoulder blades. This is normal and should improve in the next few weeks with the help of pain medication, muscle relaxers, and rest. Some patients report that a warm compress on the back of the neck or between the shoulder blades helps.  However, should you experience any of  the following, contact us immediately: . New numbness or weakness . Pain that is progressively getting worse, and is not relieved by your pain medication, muscle relaxers, rest, and warm compresses . Bleeding, redness, swelling, pain, or drainage from surgical incision . Chills or flu-like symptoms . Fever greater than 101.0 F (38.3 C) . Inability to eat, drink fluids, or take medications . Problems with bowel or bladder  functions . Difficulty breathing or shortness of breath . Warmth, tenderness, or swelling in your calf Contact Information . During office hours (Monday-Friday 9 am to 5 pm), please call your physician at [phone] and ask for Berdine Addison . After hours and weekends, please call [phone] and an answering service will put you in touch with either Dr. Lacinda Axon or Dr. Izora Ribas.  . For a life-threatening emergency, call 911

## 2019-09-28 NOTE — Plan of Care (Signed)

## 2020-05-01 ENCOUNTER — Other Ambulatory Visit: Payer: Self-pay | Admitting: Neurosurgery

## 2020-05-01 DIAGNOSIS — Z981 Arthrodesis status: Secondary | ICD-10-CM

## 2020-06-24 ENCOUNTER — Ambulatory Visit
Admission: RE | Admit: 2020-06-24 | Discharge: 2020-06-24 | Disposition: A | Discharge status: Home / Self Care | Payer: Managed Care, Other (non HMO) | Source: Ambulatory Visit | Attending: Neurosurgery | Admitting: Neurosurgery

## 2020-06-24 ENCOUNTER — Other Ambulatory Visit: Payer: Self-pay

## 2020-06-24 DIAGNOSIS — Z981 Arthrodesis status: Secondary | ICD-10-CM | POA: Diagnosis not present

## 2021-01-17 ENCOUNTER — Other Ambulatory Visit: Payer: Self-pay | Admitting: Neurosurgery

## 2021-01-17 DIAGNOSIS — M5412 Radiculopathy, cervical region: Secondary | ICD-10-CM

## 2021-01-17 DIAGNOSIS — Z981 Arthrodesis status: Secondary | ICD-10-CM

## 2021-02-24 ENCOUNTER — Other Ambulatory Visit: Payer: Self-pay

## 2021-02-24 ENCOUNTER — Ambulatory Visit
Admission: RE | Admit: 2021-02-24 | Discharge: 2021-02-24 | Disposition: A | Discharge status: Home / Self Care | Payer: Managed Care, Other (non HMO) | Source: Ambulatory Visit | Attending: Neurosurgery | Admitting: Neurosurgery

## 2021-02-24 ENCOUNTER — Ambulatory Visit: Payer: Managed Care, Other (non HMO)

## 2021-02-24 DIAGNOSIS — M5412 Radiculopathy, cervical region: Secondary | ICD-10-CM | POA: Diagnosis present

## 2021-02-24 DIAGNOSIS — Z981 Arthrodesis status: Secondary | ICD-10-CM | POA: Diagnosis present

## 2021-02-24 IMAGING — MR MR CERVICAL SPINE W/O CM
6 series · 41 of 48 positions shown · non-contrast
Comparison: Radiographs dated [DATE]

CLINICAL DATA: Left arm and shoulder pain. Cervical fusion
years ago. No known injury.

EXAM:
MRI CERVICAL SPINE WITHOUT CONTRAST
TECHNIQUE: Multiplanar, multisequence MR imaging of the cervical spine was
performed. No intravenous contrast was administered.

[Series 5: T2 · sagittal · 3.0mm · 0.62mm/px · 5 of 15 slices shown (1 of 2)]
[im 1/15]
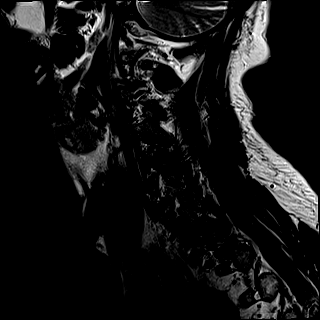
[im 4/15]
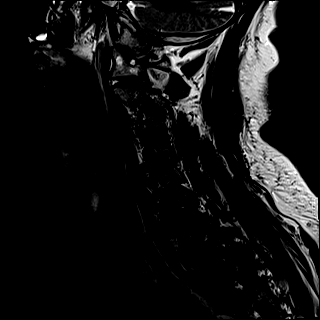
[im 8/15]
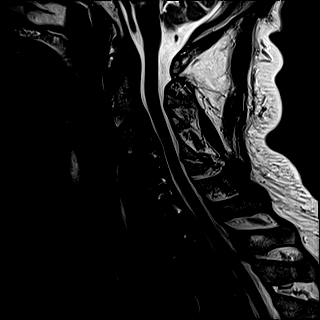
[im 11/15]
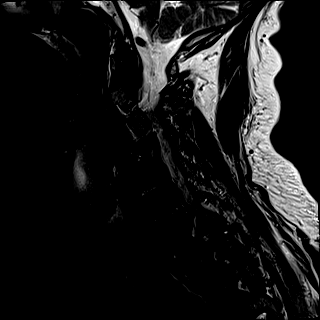
[im 15/15]
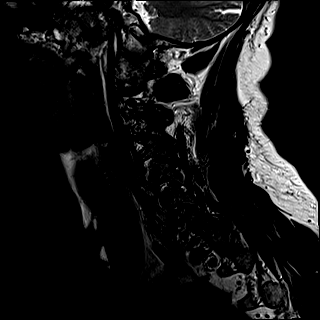

[Series 6: FLAIR · sagittal · 3.0mm · 0.78mm/px · 5 of 15 slices shown]
[im 1/15]
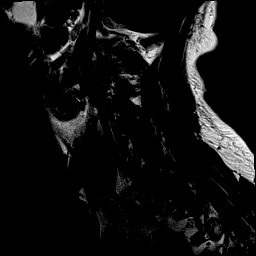
[im 4/15]
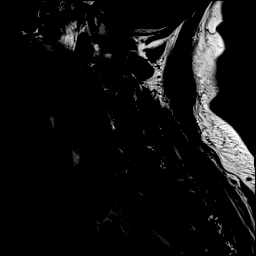
[im 8/15]
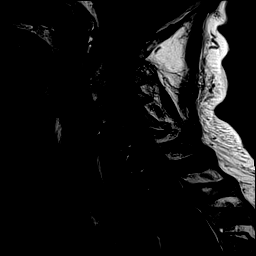
[im 11/15]
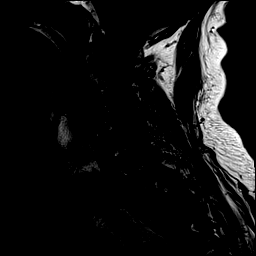
[im 15/15]
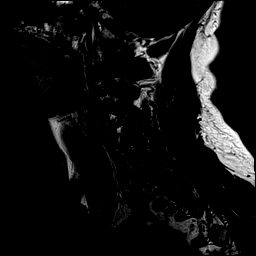

[Series 7: STIR · sagittal · 3.0mm · 0.62mm/px · 5 of 15 slices shown]
[im 1/15]
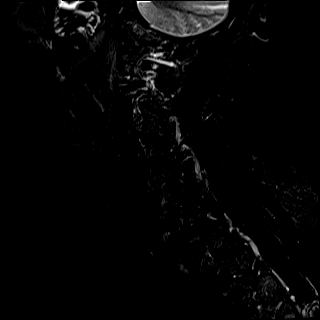
[im 4/15]
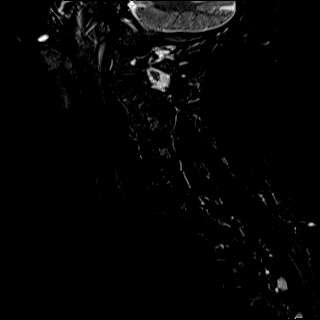
[im 8/15]
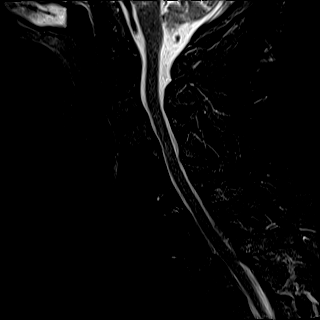
[im 11/15]
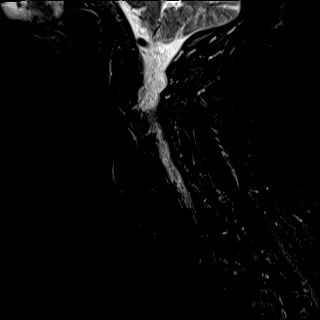
[im 15/15]
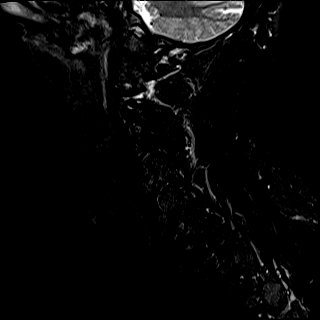

[Series 8: T2 · axial · 3.0mm · 0.70mm/px · z∈[-151,-25]mm · 13 of 39 slices shown (2 of 2)]
[im 1/39]
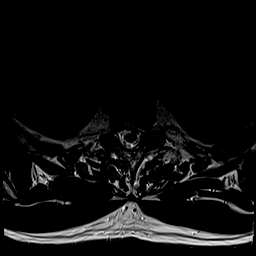
[im 3/39]
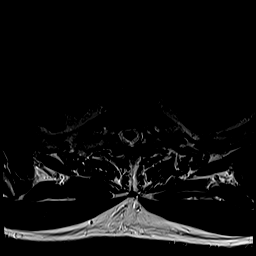
[im 6/39]
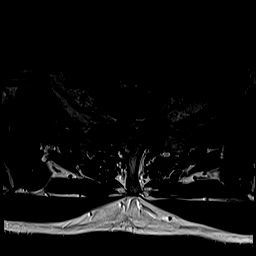
[im 9/39]
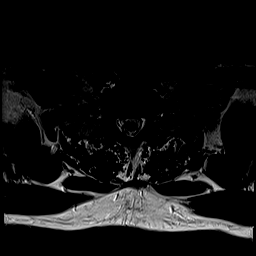
[im 12/39]
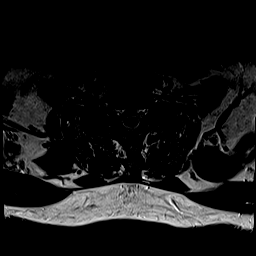
[im 15/39]
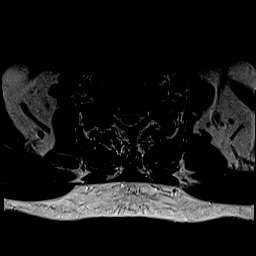
[im 18/39]
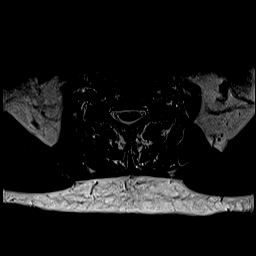
[im 21/39]
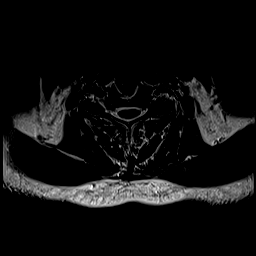
[im 24/39]
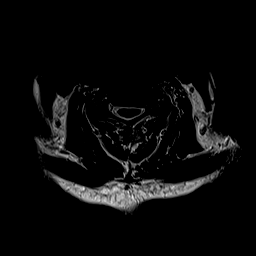
[im 27/39]
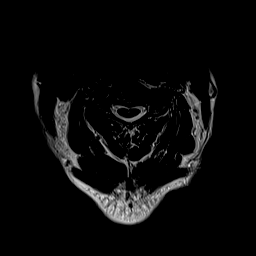
[im 30/39]
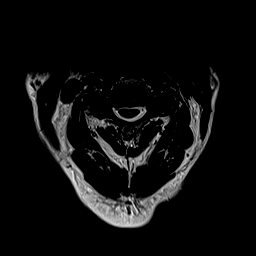
[im 33/39]
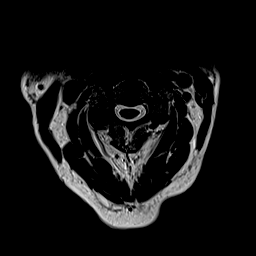
[im 39/39]
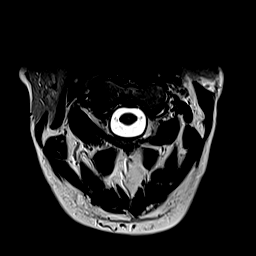

[Series 9: ax mpgr · axial · 3.0mm · 0.35mm/px · z∈[-151,-25]mm · 8 of 39 slices shown]
[im 1/39]
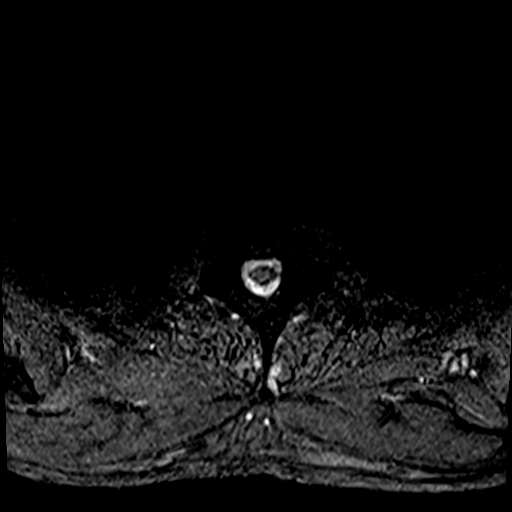
[im 6/39]
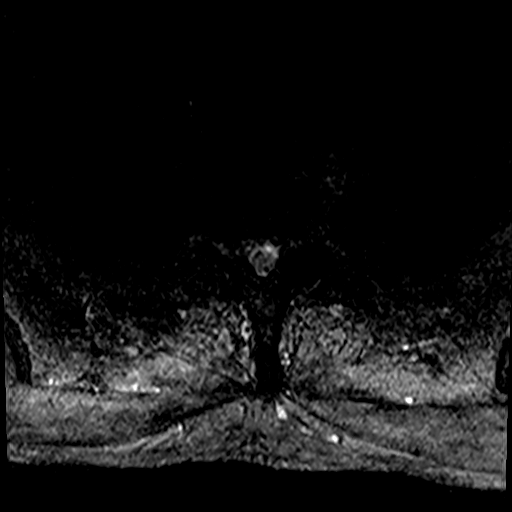
[im 12/39]
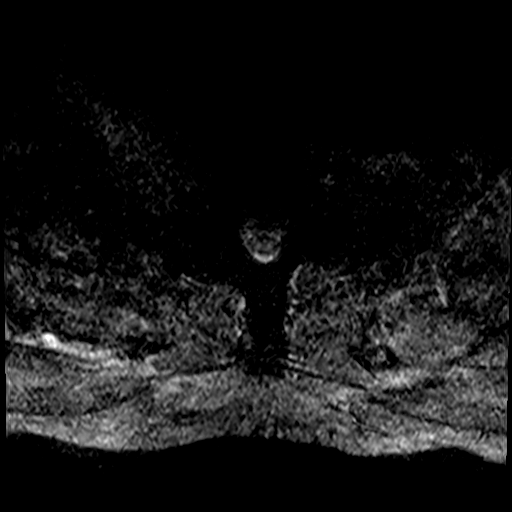
[im 18/39]
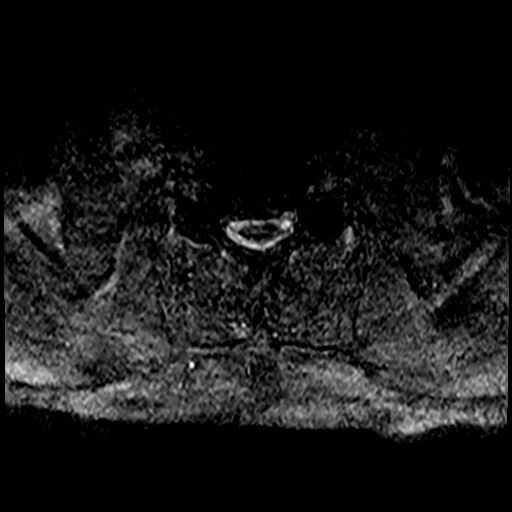
[im 21/39]
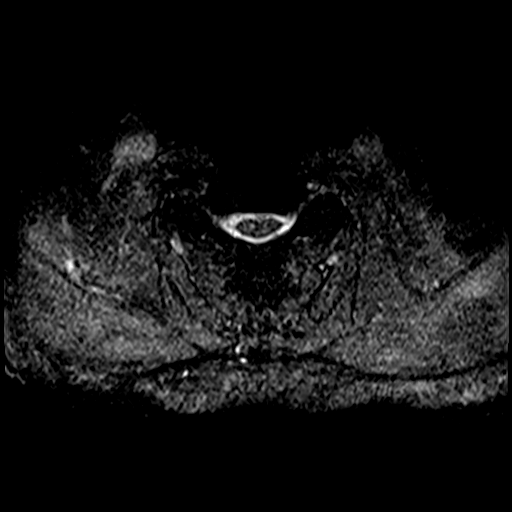
[im 27/39]
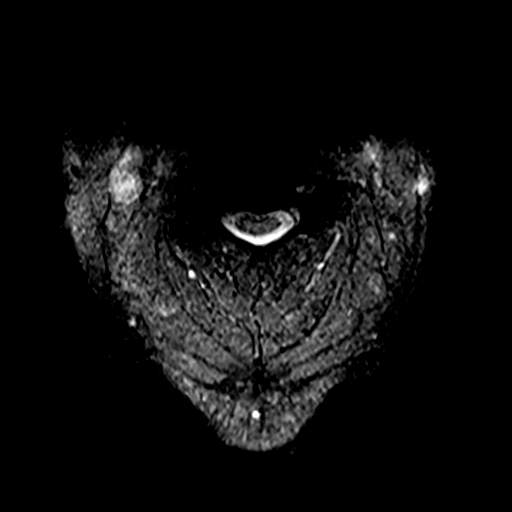
[im 33/39]
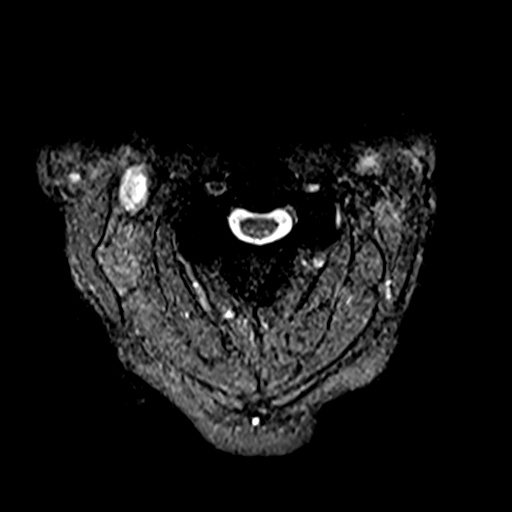
[im 39/39]
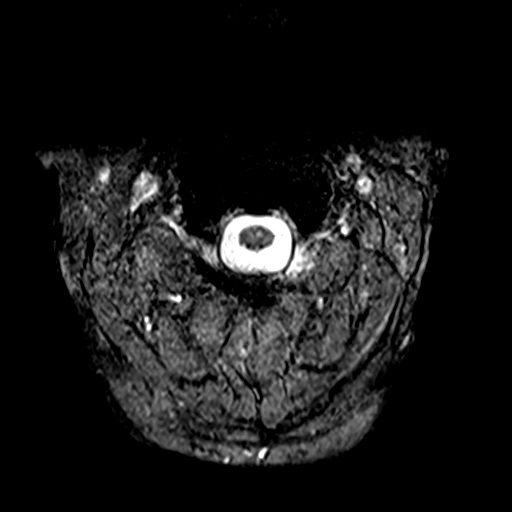

[Series 10: t2_tse_stir_warp_sag · sagittal · 3.0mm · 0.78mm/px · 5 of 15 slices shown]
[im 1/15]
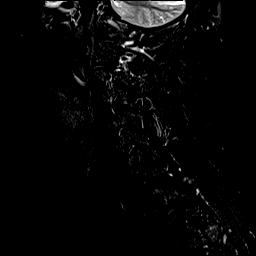
[im 4/15]
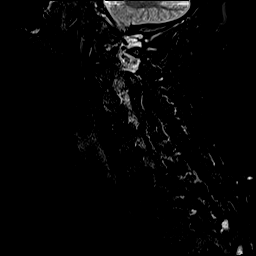
[im 8/15]
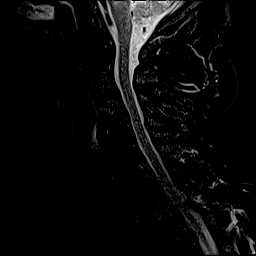
[im 11/15]
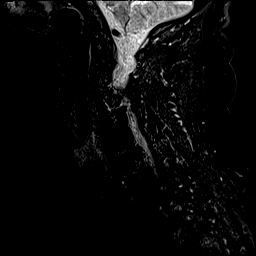
[im 15/15]
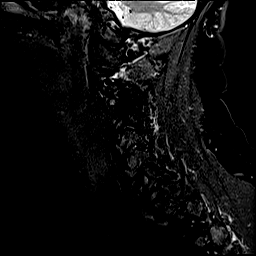

[41 of 48 positions shown; findings below may reference images not displayed]

FINDINGS: Alignment: Physiologic.

Vertebrae: No fracture, evidence of discitis, or bone lesion.
Anterior cervical discectomy and fusion at C4-C7. The hardware
appear intact.

Cord: Normal signal and morphology.

Posterior Fossa, vertebral arteries, paraspinal tissues: Negative.

Disc levels:

C2-3: Mild circumferential disc protrusion without significant
spinal canal or neural foraminal narrowing.

C3-4: No significant disc bulge. No neural foraminal stenosis. No
central canal stenosis.

C4-5: Discectomy changes. No significant spinal canal stenosis. No
significant neural foraminal narrowing.

C5-6: Discectomy changes. No significant spinal canal stenosis. No
significant neural foraminal narrowing.

C6-7: Discectomy changes. No significant spinal canal or neural
foraminal narrowing.

C7-T1: Circumferential disc protrusion without significant spinal
canal or neural foraminal narrowing.
IMPRESSION: 1. Anterior cervical discectomy and fusion at C5-C7. The hardware is
intact.

2.  Normal cord signal and morphology.

3. Mild circumferential disc protrusion at C2-C3 and C7-T1 without
significant spinal canal or neural foraminal narrowing.

## 2021-03-20 ENCOUNTER — Ambulatory Visit
Admission: RE | Admit: 2021-03-20 | Discharge: 2021-03-20 | Disposition: A | Discharge status: Home / Self Care | Payer: Managed Care, Other (non HMO) | Source: Ambulatory Visit | Attending: Neurosurgery | Admitting: Neurosurgery

## 2021-03-20 ENCOUNTER — Other Ambulatory Visit: Payer: Self-pay

## 2021-03-20 DIAGNOSIS — M5412 Radiculopathy, cervical region: Secondary | ICD-10-CM | POA: Insufficient documentation

## 2021-03-20 DIAGNOSIS — Z981 Arthrodesis status: Secondary | ICD-10-CM | POA: Diagnosis present

## 2021-03-20 IMAGING — CT CT CERVICAL SPINE W/O CM
2 series · 10 of 14 positions shown, 12 images · non-contrast
Comparison: Cervical spine CT [DATE], cervical spine MRI
[DATE]

CLINICAL DATA: History of C-spine fusion 1 year ago, pain and
numbness in right shoulder



[Series 4: c spine soft · axial · 0.40mm/px · z∈[-464,-328]mm · 5 of 102 slices shown]
[im 17/102  soft-tissue]
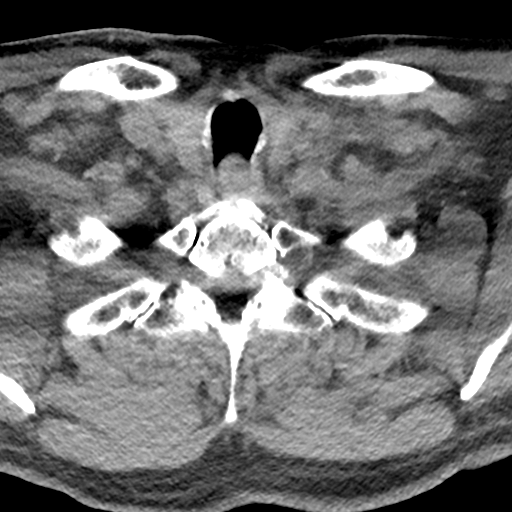
[im 34/102  soft-tissue]
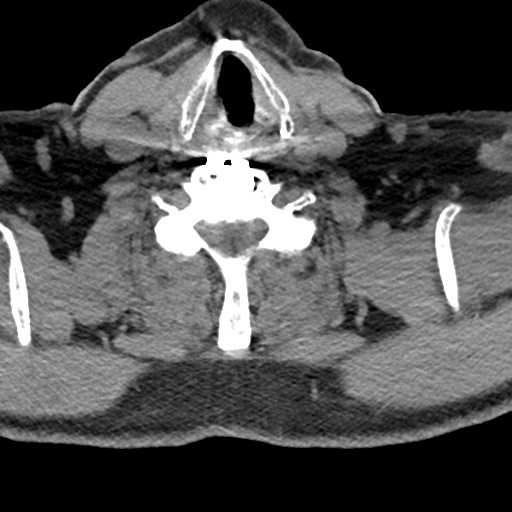
[im 51/102  soft-tissue]
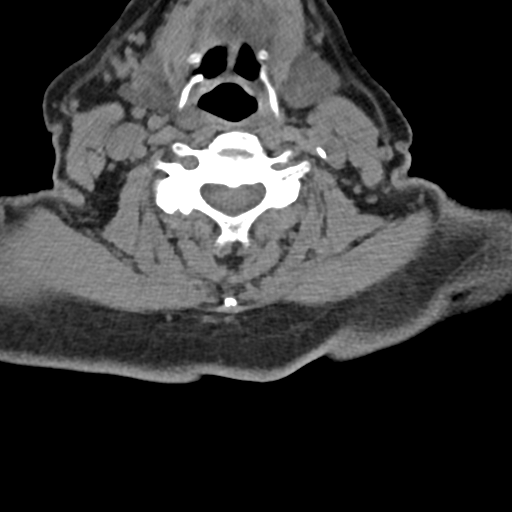
[im 68/102  soft-tissue]
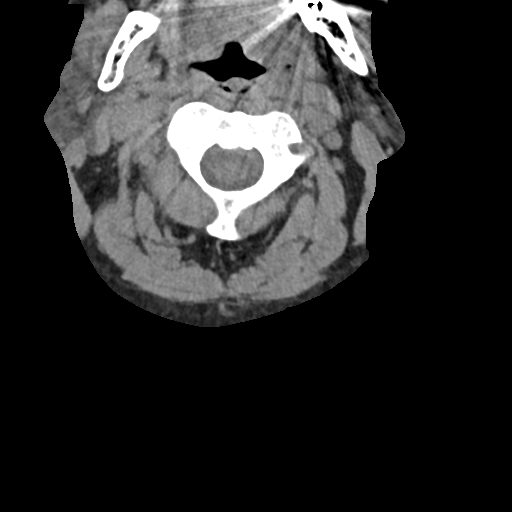
[im 85/102  soft-tissue]
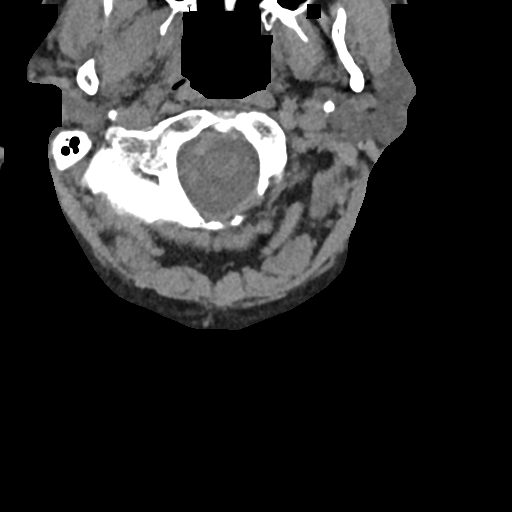

[Series 6: orthogonal bone · axial · 0.32mm/px · z∈[-464,-328]mm · 5 of 103 slices shown, 7 images]
[im 18/103  soft-tissue]
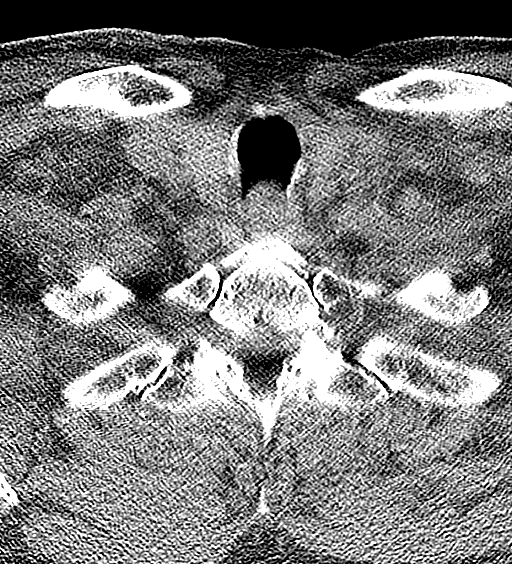
[im 18/103  bone]
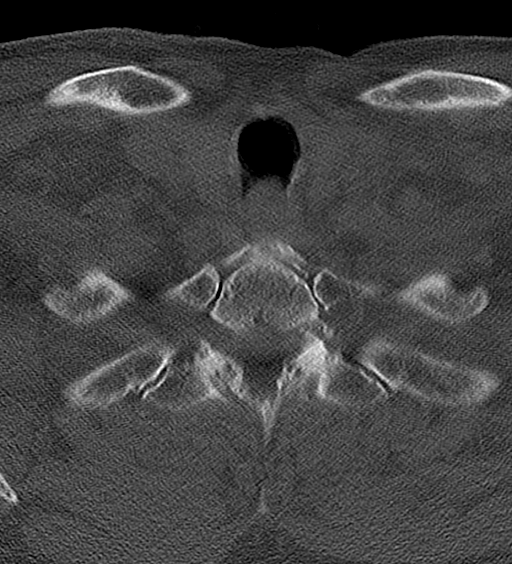
[im 35/103  bone]
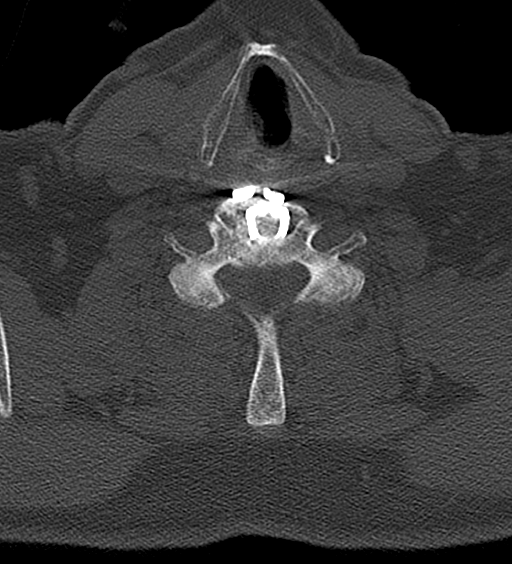
[im 52/103  bone]
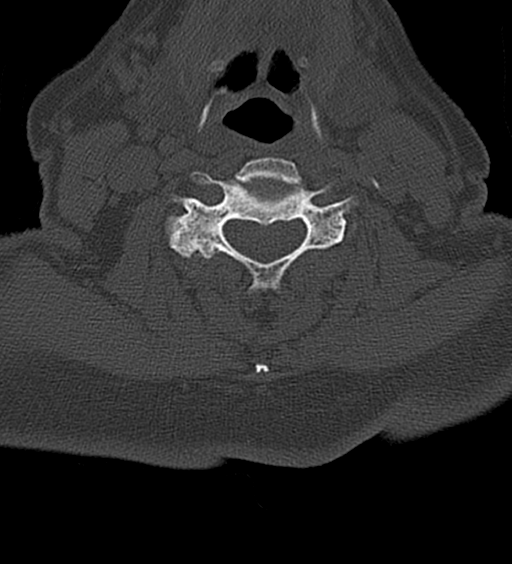
[im 69/103  bone]
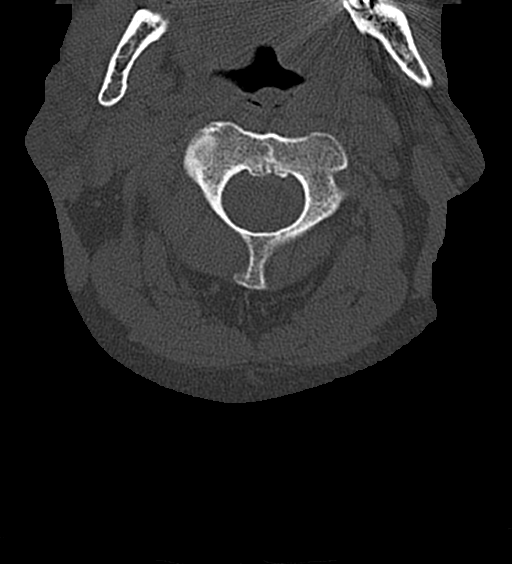
[im 86/103  soft-tissue]
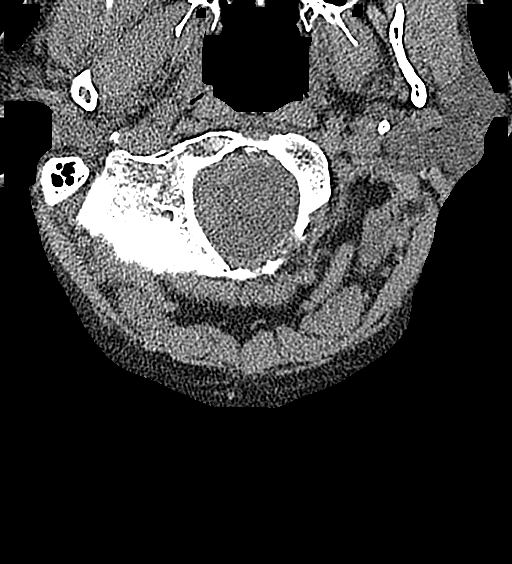
[im 86/103  bone]
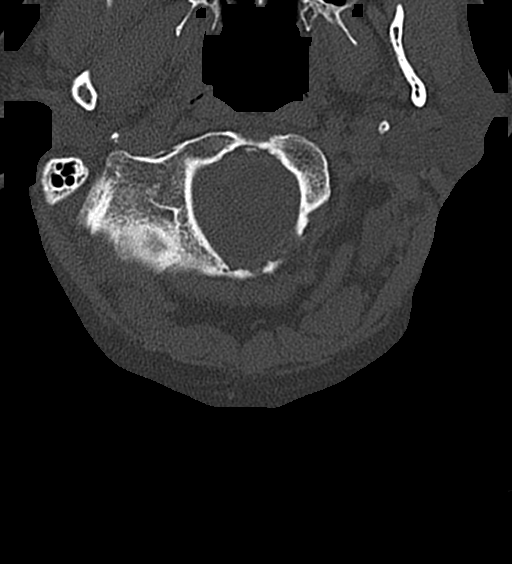

[10 of 14 positions shown; findings below may reference images not displayed]

FINDINGS: Alignment: There is grade 1 anterolisthesis of T1 on T2, unchanged.
Alignment is otherwise normal.

Skull base and vertebrae: Skull base alignment is maintained.

The patient is status post C4 through C7 ACDF. The C7 screws again
appear backed out from the plate by approximally 6 mm on the left
and 3-4 mm on the right, unchanged. Hardware alignment is otherwise
within expected limits. The appearance is similar to the prior
study, without definite evidence of progressed osseous fusion across
the surgical disc spaces.

Vertebral body heights are preserved. There is no evidence of acute
injury. There is no suspicious osseous lesion.

Soft tissues and spinal canal: No prevertebral fluid or swelling. No
visible canal hematoma.

Disc levels: There is intervertebral disc space narrowing at C7-T1
and T1-T2.

C2-C3: There is bulky right and mild left facet arthropathy
resulting in mild right and no significant left neural foraminal
stenosis and no significant spinal canal stenosis, not significantly
changed compared to the prior CT.

C3-C4: There is bulky bilateral facet arthropathy and uncovertebral
ridging bilaterally resulting in severe bilateral neural foraminal
stenosis without significant spinal canal stenosis, not
significantly changed compared to the prior CT.

C4-C5: Status post ACDF without residual spinal canal stenosis.
There is uncovertebral ridging, left worse than right, and bilateral
facet arthropathy resulting in severe left and moderate right neural
foraminal stenosis, slightly worsened on the left since the prior
CT.

C5-C6: Status post ACDF without residual spinal canal stenosis.
There is prominent bilateral uncovertebral ridging and mild
bilateral facet arthropathy resulting in moderate to severe
bilateral neural foraminal stenosis, slightly worsened on the right
since the prior CT.

C6-C7: Status post ACDF without residual spinal canal stenosis.
There is prominent uncovertebral ridging and mild bilateral facet
arthropathy resulting in moderate to severe bilateral neural
foraminal stenosis which appears slightly worsened since the CT from
[C4].

C7-T1: There is left-sided uncovertebral ridging and bilateral facet
arthropathy resulting in moderate left and no significant right
neural foraminal stenosis and no significant spinal canal stenosis.

Upper chest: Imaged lung apices are clear.

Other: None.
IMPRESSION: 1. Status post ACDF at C4 through C7. The bilateral C7 screws are
slightly backed out, left more than right, unchanged. Otherwise, no
evidence of hardware related complication. The appearance is similar
to the prior study from [C4], without definite evidence of
progressed osseous fusion.
2. No residual spinal canal stenosis.
3. Multilevel neural foraminal stenosis as detailed above primarily
due to prominent uncovertebral ridging, most notably at C3-C4
(severe bilaterally), C4-C5 (severe left and moderate right), C5-C6
(moderate to severe bilaterally), and C6-C7 (moderate to severe
bilaterally). The foraminal stenosis at C4-C5, C5-C6, and C6-C7
appears slightly worsened compared to the prior CT from [DATE].

## 2021-07-13 ENCOUNTER — Encounter: Payer: Self-pay | Admitting: Emergency Medicine

## 2021-07-13 ENCOUNTER — Emergency Department: Payer: Managed Care, Other (non HMO)

## 2021-07-13 ENCOUNTER — Ambulatory Visit
Admission: EM | Admit: 2021-07-13 | Discharge: 2021-07-13 | Disposition: A | Discharge status: Home / Self Care | Payer: Managed Care, Other (non HMO) | Source: Home / Self Care

## 2021-07-13 ENCOUNTER — Other Ambulatory Visit: Payer: Self-pay

## 2021-07-13 ENCOUNTER — Emergency Department
Admission: EM | Admit: 2021-07-13 | Discharge: 2021-07-13 | Disposition: A | Discharge status: Home / Self Care | Payer: Managed Care, Other (non HMO) | Attending: Emergency Medicine | Admitting: Emergency Medicine

## 2021-07-13 DIAGNOSIS — R634 Abnormal weight loss: Secondary | ICD-10-CM | POA: Insufficient documentation

## 2021-07-13 DIAGNOSIS — I959 Hypotension, unspecified: Secondary | ICD-10-CM | POA: Insufficient documentation

## 2021-07-13 DIAGNOSIS — R001 Bradycardia, unspecified: Secondary | ICD-10-CM | POA: Insufficient documentation

## 2021-07-13 DIAGNOSIS — E119 Type 2 diabetes mellitus without complications: Secondary | ICD-10-CM | POA: Insufficient documentation

## 2021-07-13 DIAGNOSIS — I10 Essential (primary) hypertension: Secondary | ICD-10-CM | POA: Diagnosis not present

## 2021-07-13 DIAGNOSIS — I2510 Atherosclerotic heart disease of native coronary artery without angina pectoris: Secondary | ICD-10-CM | POA: Diagnosis not present

## 2021-07-13 DIAGNOSIS — J449 Chronic obstructive pulmonary disease, unspecified: Secondary | ICD-10-CM | POA: Insufficient documentation

## 2021-07-13 DIAGNOSIS — R531 Weakness: Secondary | ICD-10-CM | POA: Insufficient documentation

## 2021-07-13 DIAGNOSIS — R55 Syncope and collapse: Secondary | ICD-10-CM | POA: Diagnosis present

## 2021-07-13 LAB — CBC WITH DIFFERENTIAL/PLATELET
Abs Immature Granulocytes: 0.03 10*3/uL (ref 0.00–0.07)
Basophils Absolute: 0.1 10*3/uL (ref 0.0–0.1)
Basophils Relative: 1 %
Eosinophils Absolute: 0.3 10*3/uL (ref 0.0–0.5)
Eosinophils Relative: 4 %
HCT: 44.2 % (ref 39.0–52.0)
Hemoglobin: 14.3 g/dL (ref 13.0–17.0)
Immature Granulocytes: 0 %
Lymphocytes Relative: 11 %
Lymphs Abs: 1 10*3/uL (ref 0.7–4.0)
MCH: 31.5 pg (ref 26.0–34.0)
MCHC: 32.4 g/dL (ref 30.0–36.0)
MCV: 97.4 fL (ref 80.0–100.0)
Monocytes Absolute: 0.6 10*3/uL (ref 0.1–1.0)
Monocytes Relative: 6 %
Neutro Abs: 7.4 10*3/uL (ref 1.7–7.7)
Neutrophils Relative %: 78 %
Platelets: 164 10*3/uL (ref 150–400)
RBC: 4.54 MIL/uL (ref 4.22–5.81)
RDW: 13.1 % (ref 11.5–15.5)
WBC: 9.4 10*3/uL (ref 4.0–10.5)
nRBC: 0 % (ref 0.0–0.2)

## 2021-07-13 LAB — COMPREHENSIVE METABOLIC PANEL
ALT: 26 U/L (ref 0–44)
AST: 31 U/L (ref 15–41)
Albumin: 3.9 g/dL (ref 3.5–5.0)
Alkaline Phosphatase: 50 U/L (ref 38–126)
Anion gap: 8 (ref 5–15)
BUN: 23 mg/dL (ref 8–23)
CO2: 24 mmol/L (ref 22–32)
Calcium: 9.1 mg/dL (ref 8.9–10.3)
Chloride: 106 mmol/L (ref 98–111)
Creatinine, Ser: 1.56 mg/dL — ABNORMAL HIGH (ref 0.61–1.24)
GFR, Estimated: 46 mL/min — ABNORMAL LOW (ref >60)
Glucose, Bld: 139 mg/dL — ABNORMAL HIGH (ref 70–99)
Potassium: 4.6 mmol/L (ref 3.5–5.1)
Sodium: 138 mmol/L (ref 135–145)
Total Bilirubin: 1.1 mg/dL (ref 0.3–1.2)
Total Protein: 6.5 g/dL (ref 6.5–8.1)

## 2021-07-13 LAB — TROPONIN I (HIGH SENSITIVITY): Troponin I (High Sensitivity): 4 ng/L (ref <18)

## 2021-07-13 LAB — GLUCOSE, CAPILLARY: Glucose-Capillary: 150 mg/dL — ABNORMAL HIGH (ref 70–99)

## 2021-07-13 LAB — CBG MONITORING, ED: Glucose-Capillary: 133 mg/dL — ABNORMAL HIGH (ref 70–99)

## 2021-07-13 IMAGING — CR DG CHEST 2V
2 series · 2 of 2 positions shown · non-contrast
Comparison: [DATE]

CLINICAL DATA: Dizziness and weakness.

EXAM:
CHEST - 2 VIEW

[chest pa]
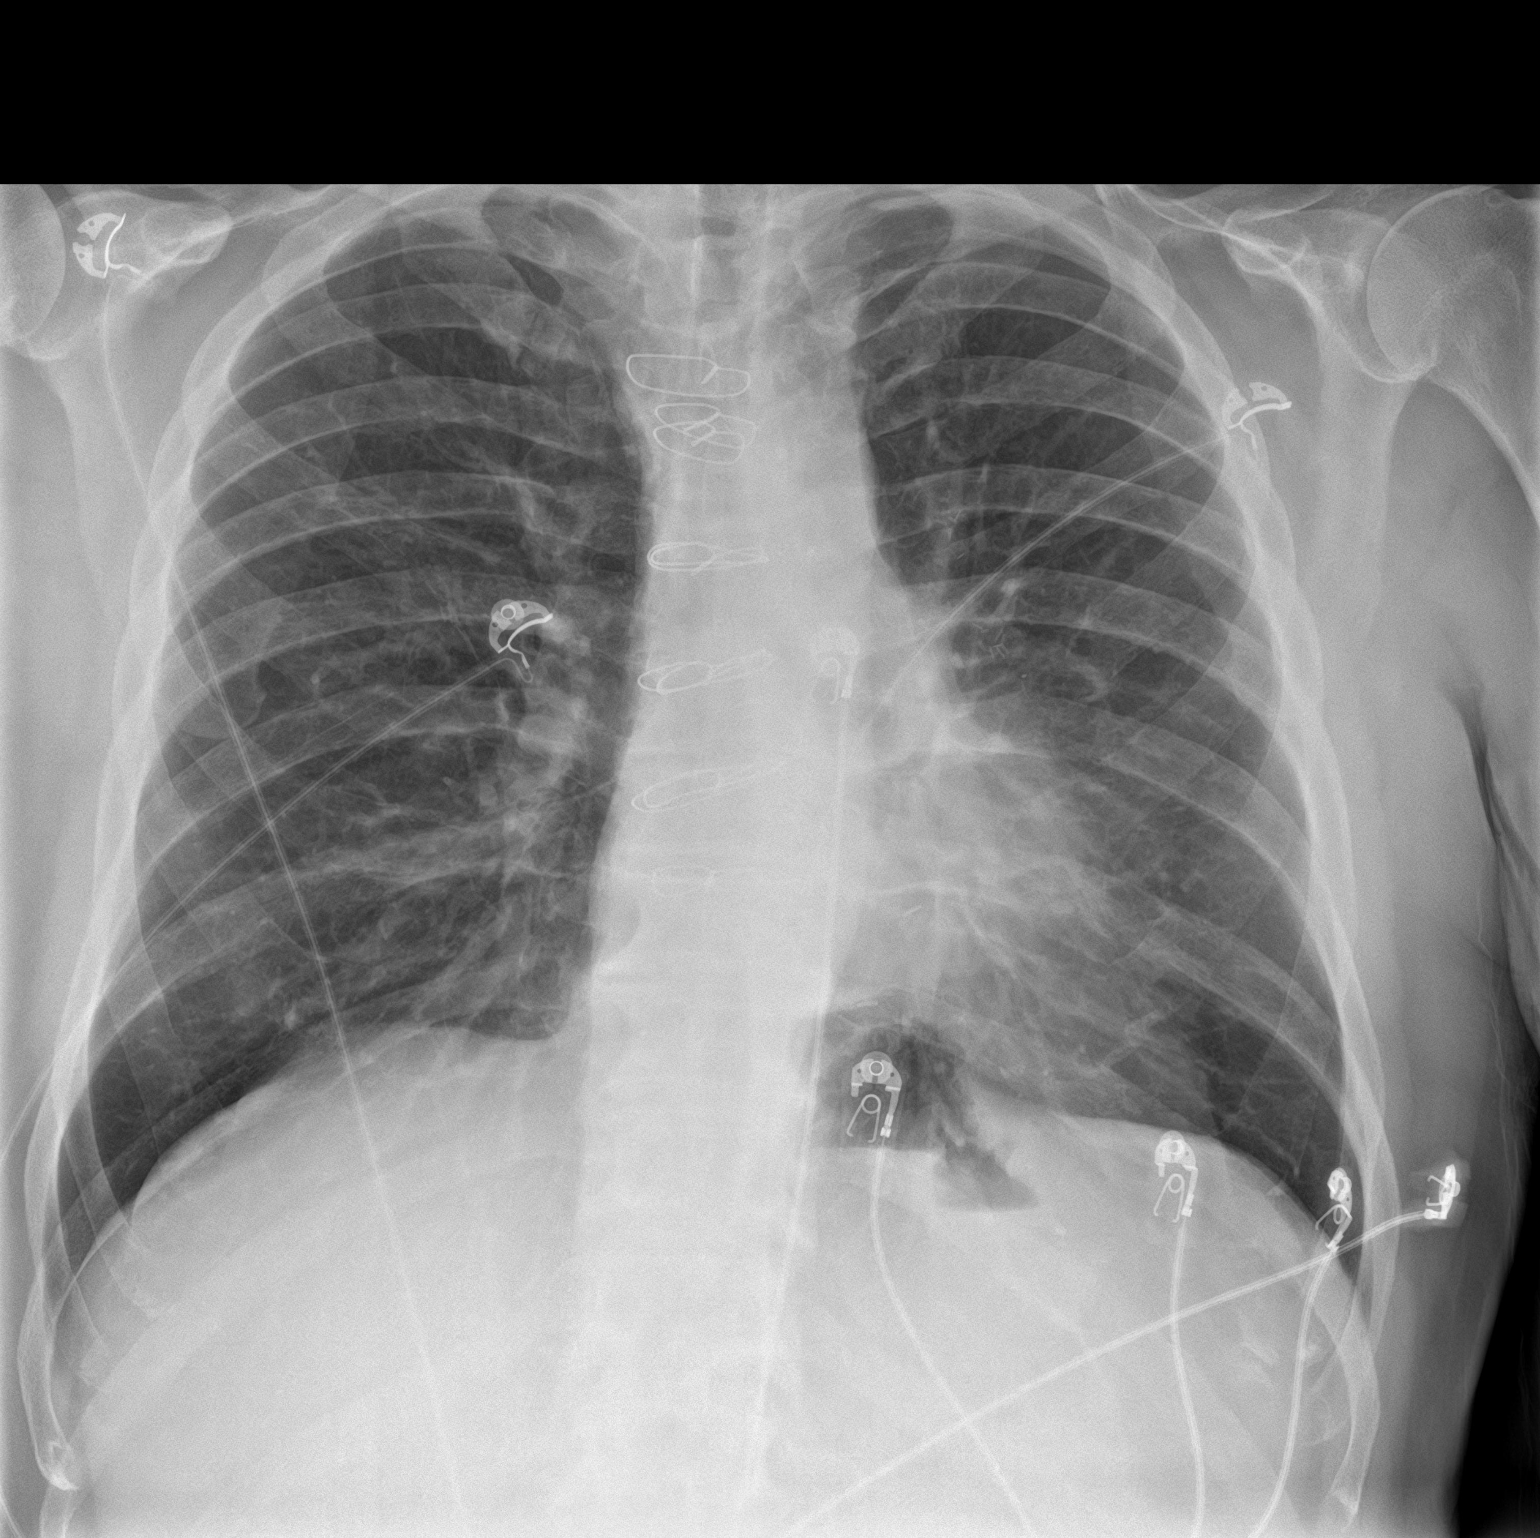

[chest lat]
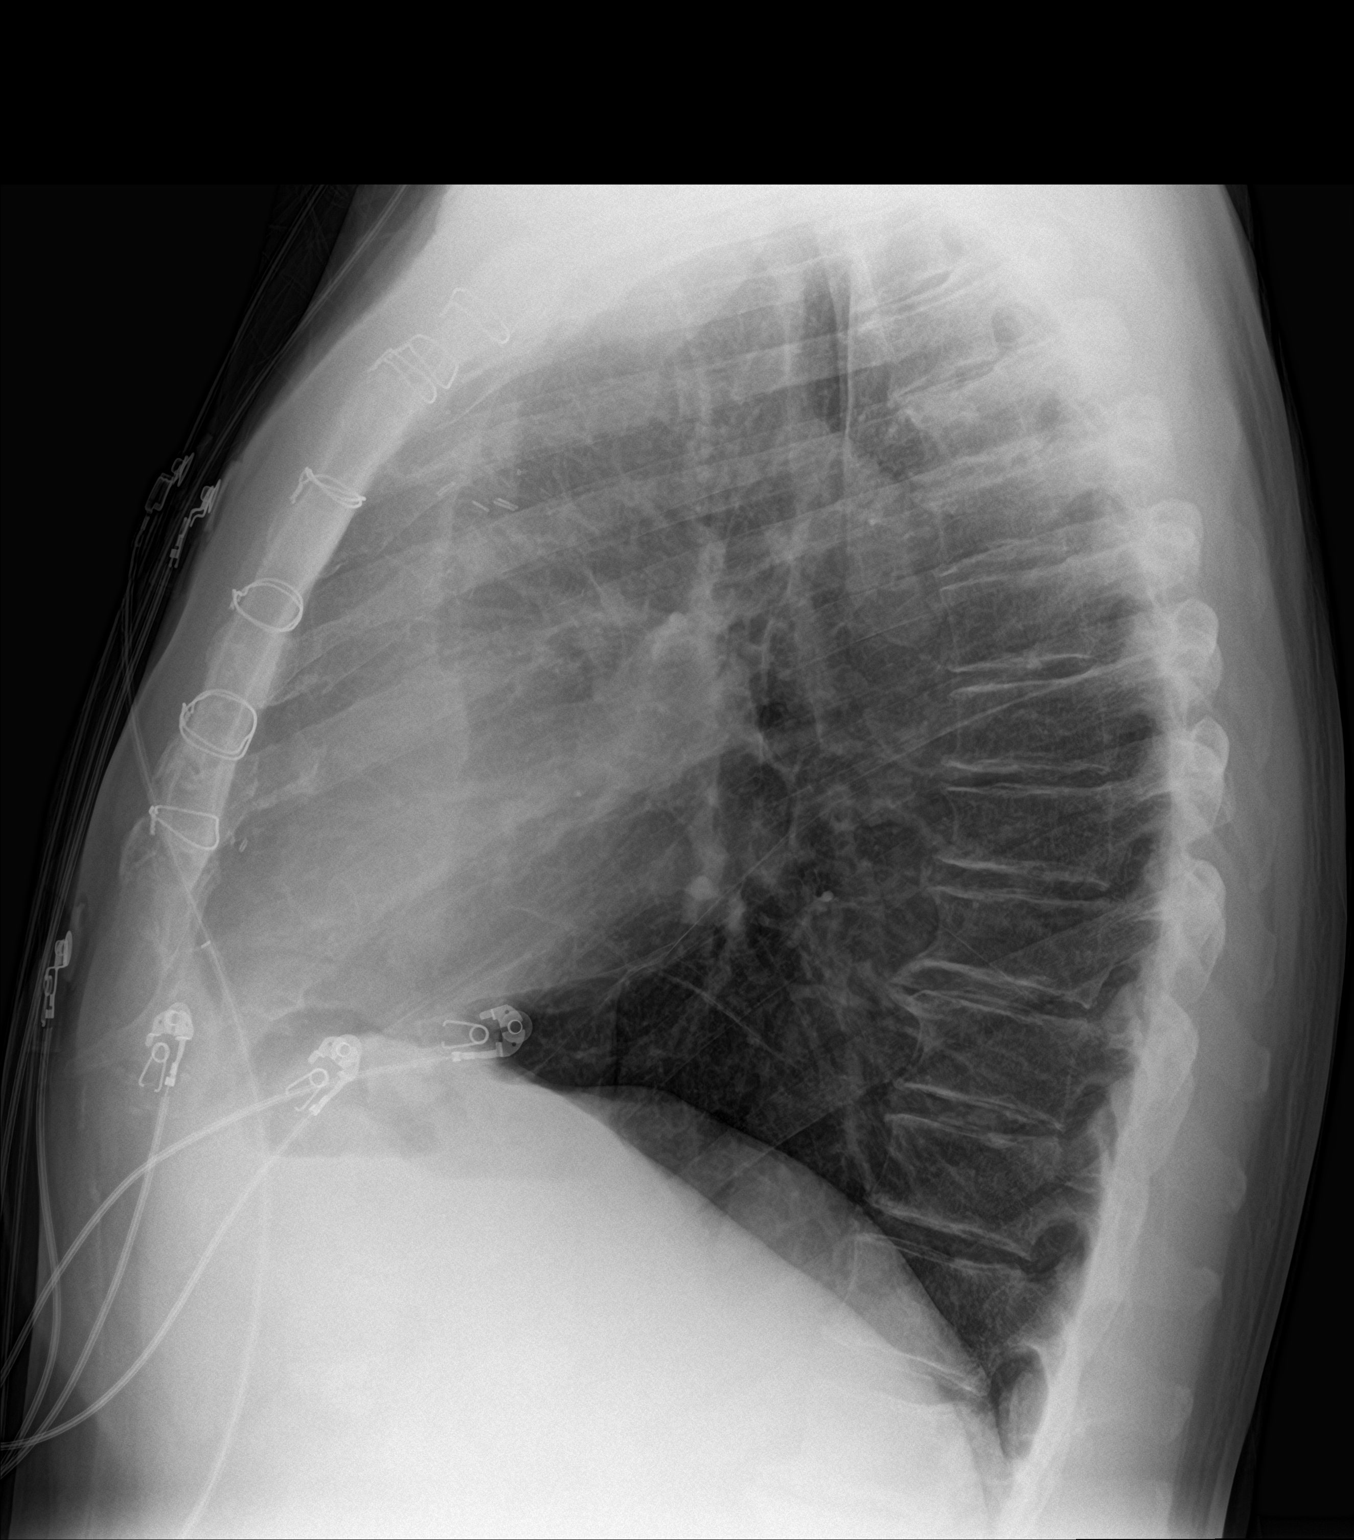

[2 of 2 positions shown; findings below may reference images not displayed]

FINDINGS: The lungs are clear without focal pneumonia, edema, pneumothorax or
pleural effusion. The cardiopericardial silhouette is within normal
limits for size. The visualized bony structures of the thorax are
unremarkable. Telemetry leads overlie the chest.
IMPRESSION: No active cardiopulmonary disease.

## 2021-07-13 MED ORDER — SODIUM CHLORIDE 0.9 % IV BOLUS
1000.0000 mL | Freq: Once | INTRAVENOUS | Status: AC
  Administered 2021-07-13: 1000 mL via INTRAVENOUS

## 2021-07-13 NOTE — ED Notes (Signed)
Patient is being discharged from the Urgent Care and sent to the [facility] Emergency Department via EMS . Per Laurene Footman, PA, patient is in need of higher level of care due to bradycardia, hypotensive state. Patient and wife aware and verbalizes understanding of plan of care.  Vitals:   07/13/21 1035 07/13/21 1038  BP: (!) 114/99 (!) 79/58  Pulse: (!) 49   Resp: 15   Temp:    SpO2: 92%

## 2021-07-13 NOTE — ED Triage Notes (Signed)
Pt had near syncopal event today and presents to [facility] clinic and was referred to [facility]. Pt arrives with [facility] alert and oriented. PT stating they recently started Keto diet and has recently lost 37 lbs. Pt takes lorsartan for htn. Pt was instructed to adjust their dosage of losartan. Pt alert and oriented at time of arrival.

## 2021-07-13 NOTE — ED Provider Notes (Signed)
[facility] Provider Note    Event Date/Time   First MD Initiated Contact with Patient 07/13/21 1130     (approximate)   History   Near Syncope   HPI  Zachary ILLESCAS Sr. is a 76 y.o. male with history of COPD, CAD, diabetes, MI and as listed below presents to the emergency department for treatment and evaluation after a near syncopal episode this morning.  Patient has been on a keto diet for about 6 weeks and has lost about 37 pounds. His blood pressure has been decreasing with the weight loss and his PCP had him reduce his Losartan to '50mg'$  3 weeks ago. This morning, he felt dizzy and like he was going to pass out, so his wife took him to urgent care. He was hypotensive and bradycardic and was sent to the ER for evaluation. Patient now feels better but remains hypotensive and bradycardic, but he is awake, alert, and oriented.  He denies chest pain, palpitations, or shortness of breath.   Past Medical History:  Diagnosis Date   Anxiety    Arthritis    COPD (chronic obstructive pulmonary disease) ([place])    Coronary artery disease    Diabetes mellitus without complication (HCC)    diet controlled   GERD (gastroesophageal reflux disease)    Hemorrhoids    History of colon polyps    Hx of seasonal allergies    Hyperlipidemia    Hypertension    MRSA infection    a sore on face   Myocardial infarction ([place]) 2010   Pneumonia     Physical Exam   Triage Vital Signs: ED Triage Vitals  Enc Vitals Group     BP --      Pulse Rate 07/13/21 1125 (!) 45     Resp 07/13/21 1125 16     Temp --      Temp src --      SpO2 07/13/21 1123 98 %     Weight 07/13/21 1126 213 lb (96.6 kg)     Height --      Head Circumference --      Peak Flow --      Pain Score 07/13/21 1125 0     Pain Loc --      Pain Edu? --      Excl. in [place]? --     Most recent vital signs: Vitals:   07/13/21 1330 07/13/21 1505  BP: 116/82 122/73  Pulse: 67 76  Resp: 16 (!) 26  Temp:     SpO2: 94% 99%    General: Awake, no distress.  CV:  Good peripheral perfusion.  Resp:  Normal effort. Breath sounds clear. Abd:  No distention.  Other:  Skin normal in color and turgor.   ED Results / Procedures / Treatments   Labs (all labs ordered are listed, but only abnormal results are displayed) Labs Reviewed  COMPREHENSIVE METABOLIC PANEL - Abnormal; Notable for the following components:      Result Value   Glucose, Bld 139 (*)    Creatinine, Ser 1.56 (*)    GFR, Estimated 46 (*)    All other components within normal limits  CBG MONITORING, ED - Abnormal; Notable for the following components:   Glucose-Capillary 133 (*)    All other components within normal limits  CBC WITH DIFFERENTIAL/PLATELET  URINALYSIS, ROUTINE W REFLEX MICROSCOPIC  TROPONIN I (HIGH SENSITIVITY)  TROPONIN I (HIGH SENSITIVITY)     EKG  ED ECG  REPORT I, Sherrie George, FNP-BC personally viewed and interpreted this ECG.   Date: 07/13/2021  EKG Time: 1127  Rate: 48  Rhythm: sinus bradycardia  Axis: normal  Intervals:none  ST&T Change: none    RADIOLOGY  Chest x-ray without acute abnormality.  I have independently reviewed and interpreted imaging as well as reviewed report from radiology.  PROCEDURES:  Critical Care performed: No  Procedures   MEDICATIONS ORDERED IN ED:  Medications  sodium chloride 0.9 % bolus 1,000 mL (0 mLs Intravenous Stopped 07/13/21 1504)     IMPRESSION / MDM / ASSESSMENT AND PLAN / ED COURSE   I reviewed the triage vital signs and the nursing notes.  Differential diagnosis includes, but is not limited to: Cardiac event, dehydration, vasovagal episode, electrolyte disturbance  The patient is on the cardiac monitor to evaluate for evidence of arrhythmia and/or significant heart rate changes.  Clinical Course as of 07/13/21 1718  Sun Jul 13, 2021  1333 Rate on bedside monitor is 7.  Patient states that he feels "back to normal" and is asking  when he can go home.  Lab studies are still pending. Patient eating crackers and drinking diet [facility] and agrees to stay until workup is complete. [CT]  1535 Patient is anxiously awaiting discharge.  Blood pressure and heart rate have remained stable after fluids.  He has had no dizziness upon standing or walking around in the room.  Plan will be to discuss ER return precautions and have him follow-up with his primary care provider this week.  He was also encouraged to call and schedule follow-up appointment with cardiology. [CT]    Clinical Course User Index [CT] Uel Davidow B, FNP     FINAL CLINICAL IMPRESSION(S) / ED DIAGNOSES   Final diagnoses:  Near syncope  Hypotensive episode     Rx / DC Orders   ED Discharge Orders     None        Note:  This document was prepared using Dragon voice recognition software and may include unintentional dictation errors.   Victorino Dike, FNP 07/13/21 1721    Naaman Plummer, MD 07/14/21 [phone]

## 2021-07-13 NOTE — ED Provider Notes (Signed)
[facility]    CSN: [id number] Arrival date & time: 07/13/21  1001      History   Chief Complaint Chief Complaint  Patient presents with   Hypotension   Dizziness    HPI Zachary Trevino. is a 76 y.o. male presenting with his wife for concerns about dizziness and fatigue/weakness that began today.  He has felt like he is going to pass out.  He checked his blood pressure at home this morning and it was 90/50 and he felt dizzy at that time.  He has intentionally lost 37 pounds in the past 7 weeks on a special diet where he eats very small keto bars 6 times a day.  He has a history of hypertension, COPD, coronary artery disease, previous MI and CABG as well as diet-controlled diabetes.  Patient reports he took half a tablet of the losartan today.  He says he thinks he may not needed to have taken it.  Patient says he has felt well up until today and now he feels weak.  He is denying any associated fevers but has had cold sweats.  Denies chest pain, shortness of breath, abdominal pain, nausea or vomiting.  Patient has upcoming appointment with cardiologist on 07/21/2021.  HPI  Past Medical History:  Diagnosis Date   Anxiety    Arthritis    COPD (chronic obstructive pulmonary disease) ([place])    Coronary artery disease    Diabetes mellitus without complication (HCC)    diet controlled   GERD (gastroesophageal reflux disease)    Hemorrhoids    History of colon polyps    Hx of seasonal allergies    Hyperlipidemia    Hypertension    MRSA infection    a sore on face   Myocardial infarction [facility]) 2010   Pneumonia     Patient Active Problem List   Diagnosis Date Noted   S/P cervical spinal fusion 09/27/2019    Past Surgical History:  Procedure Laterality Date   ANTERIOR CERVICAL DECOMP/DISCECTOMY FUSION N/A 09/27/2019   Procedure: ANTERIOR CERVICAL DECOMPRESSION/DISCECTOMY FUSION 3 LEVELS C4-7;  Surgeon: Meade Maw, MD;  Location: [facility] ORS;  Service:  Neurosurgery;  Laterality: N/A;   CERVICAL SPINE SURGERY     COLONOSCOPY WITH ESOPHAGOGASTRODUODENOSCOPY (EGD)     COLONOSCOPY WITH PROPOFOL N/A 09/06/2017   Procedure: COLONOSCOPY WITH PROPOFOL;  Surgeon: Lollie Sails, MD;  Location: [facility] ENDOSCOPY;  Service: Endoscopy;  Laterality: N/A;   CORONARY ARTERY BYPASS GRAFT  2010   3 vessel   EYE SURGERY     HEMORRHOID SURGERY  20 years ago   twice   NASAL SEPTOPLASTY W/ TURBINOPLASTY Bilateral 11/04/2017   Procedure: NASAL SEPTOPLASTY WITH INFERIOR TURBINATE REDUCTION;  Surgeon: Margaretha Sheffield, MD;  Location: [place];  Service: ENT;  Laterality: Bilateral;   RETINAL DETACHMENT SURGERY Left 08/2019   RETINAL TEAR REPAIR CRYOTHERAPY Right    VEIN BYPASS SURGERY  04/2008   3 vessel with vein graft and stent placement       Home Medications    Prior to Admission medications   Medication Sig Start Date End Date Taking? Authorizing Provider  albuterol (PROVENTIL HFA;VENTOLIN HFA) 108 (90 Base) MCG/ACT inhaler Inhale 2 puffs into the lungs every 4 (four) hours as needed for wheezing or shortness of breath.   Yes [provider]  Ascorbic Acid (VITAMIN C) 1000 MG tablet Take 1,000 mg by mouth daily.   Yes [provider]  aspirin EC 81 MG tablet  Take 81 mg by mouth daily. Swallow whole.   Yes [provider]  Cholecalciferol (VITAMIN D3 PO) Take 2 tablets by mouth daily.   Yes [provider]  escitalopram (LEXAPRO) 10 MG tablet Take 10 mg by mouth at bedtime.    Yes [provider]  Fluticasone-Salmeterol (ADVAIR) 100-50 MCG/DOSE AEPB Inhale 1 puff into the lungs 2 (two) times daily.   Yes [provider]  gabapentin (NEURONTIN) 100 MG capsule Take 1 capsule (100 mg total) by mouth 2 (two) times daily. 09/28/19  Yes Zdeb, Altha Harm, NP  losartan (COZAAR) 100 MG tablet Take 50 mg by mouth daily.   Yes [provider]  lovastatin (MEVACOR) 40 MG tablet Take 40 mg by  mouth at bedtime.   Yes [provider]  omeprazole (PRILOSEC) 40 MG capsule Take 40 mg by mouth daily.  02/17/13  Yes [provider]  Turmeric 400 MG CAPS Take 400 mg by mouth daily.    Yes [provider]  acetaminophen (TYLENOL) 500 MG tablet Take 2 tablets (1,000 mg total) by mouth every 6 (six) hours. 09/28/19   Lonell Face, NP  Alpha-Lipoic Acid 200 MG CAPS Take 200 mg by mouth daily.     [provider]  alum & mag hydroxide-simeth (MAALOX/MYLANTA) 200-200-20 MG/5ML suspension Take 30 mLs by mouth every 6 (six) hours as needed for indigestion. 09/28/19   Lonell Face, NP  bisacodyl (DULCOLAX) 5 MG EC tablet Take 1 tablet (5 mg total) by mouth daily as needed for moderate constipation. 09/28/19   Lonell Face, NP  Cinnamon 500 MG TABS Take 500 mg by mouth daily.     [provider]  COENZYME Q-10 PO Take 1 capsule by mouth daily.    [provider]  docusate sodium (COLACE) 100 MG capsule Take 100 mg by mouth daily.    [provider]  ipratropium-albuterol (DUONEB) 0.5-2.5 (3) MG/3ML SOLN Take 3 mLs by nebulization every 6 (six) hours as needed (shortness of breath).     [provider]  menthol-cetylpyridinium (CEPACOL) 3 MG lozenge Take 1 lozenge (3 mg total) by mouth as needed for sore throat (sore throat). 09/28/19   Lonell Face, NP  methocarbamol (ROBAXIN) 750 MG tablet Take 1 tablet (750 mg total) by mouth every 6 (six) hours. 09/28/19   Lonell Face, NP  Multiple Vitamin (MULTIVITAMIN) tablet Take 1 tablet by mouth daily.    [provider]  oxyCODONE (ROXICODONE) 5 MG immediate release tablet Take 1 tablet (5 mg total) by mouth every 4 (four) hours as needed for moderate pain or severe pain ('5mg'$  for moderate pain (3-6/10), '10mg'$  for severe pain (7-10/10)). 09/28/19   Lonell Face, NP  phenol (CHLORASEPTIC) 1.4 % LIQD Use as directed 1 spray in the mouth or throat as needed for throat irritation  / pain. 09/28/19   Lonell Face, NP  polyethylene glycol (MIRALAX / GLYCOLAX) 17 g packet Take 17 g by mouth daily.    [provider]  senna (SENOKOT) 8.6 MG TABS tablet Take 2 tablets (17.2 mg total) by mouth 2 (two) times daily. 09/28/19   Lonell Face, NP    Family History Family History  Problem Relation Age of Onset   Prostate cancer Father    Diabetes Father     Social History Social History   Tobacco Use   Smoking status: Former    Years: 25.00    Types: Cigarettes    Quit date: 02/16/1978    Years since  quitting: 43.4   Smokeless tobacco: Never  Vaping Use   Vaping Use: Never used  Substance Use Topics   Alcohol use: Not Currently   Drug use: No     Allergies   Atorvastatin and Rosuvastatin   Review of Systems Review of Systems  Constitutional:  Positive for diaphoresis and fatigue. Negative for fever.  HENT:  Negative for congestion.   Respiratory:  Negative for cough and shortness of breath.   Cardiovascular:  Negative for chest pain and palpitations.  Gastrointestinal:  Negative for abdominal pain, nausea and vomiting.  Genitourinary:  Negative for difficulty urinating and dysuria.  Musculoskeletal:  Negative for arthralgias and myalgias.  Neurological:  Positive for dizziness, weakness and light-headedness. Negative for syncope, facial asymmetry, numbness and headaches.    Physical Exam Triage Vital Signs ED Triage Vitals  Enc Vitals Group     BP 07/13/21 1020 (!) 65/49     Pulse Rate 07/13/21 1020 67     Resp 07/13/21 1020 15     Temp 07/13/21 1020 97.6 F (36.4 C)     Temp Source 07/13/21 1020 Oral     SpO2 07/13/21 1020 97 %     Weight 07/13/21 1014 211 lb (95.7 kg)     Height 07/13/21 1014 6' (1.829 m)     Head Circumference --      Peak Flow --      Pain Score 07/13/21 1014 0     Pain Loc --      Pain Edu? --      Excl. in [place]? --    No data found.  Updated Vital Signs BP (!) 79/58 (BP Location: Left Arm)   Pulse (!) 49    Temp 97.6 F (36.4 C) (Oral)   Resp 15   Ht 6' (1.829 m)   Wt 211 lb (95.7 kg)   SpO2 92%   BMI 28.62 kg/m   Physical Exam Vitals and nursing note reviewed.  Constitutional:      General: He is not in acute distress.    Appearance: He is well-developed. He is diaphoretic. He is not ill-appearing.  HENT:     Head: Normocephalic and atraumatic.     Nose: Nose normal.     Mouth/Throat:     Mouth: Mucous membranes are dry.     Pharynx: Oropharynx is clear.  Eyes:     General: No scleral icterus.    Conjunctiva/sclera: Conjunctivae normal.  Cardiovascular:     Rate and Rhythm: Regular rhythm. Bradycardia present.  Pulmonary:     Effort: Pulmonary effort is normal. No respiratory distress.     Breath sounds: Normal breath sounds.  Abdominal:     Palpations: Abdomen is soft.     Tenderness: There is no abdominal tenderness.  Musculoskeletal:     Cervical back: Neck supple.  Skin:    General: Skin is warm.     Capillary Refill: Capillary refill takes less than 2 seconds.  Neurological:     General: No focal deficit present.     Mental Status: He is alert and oriented to person, place, and time. Mental status is at baseline.     Motor: Weakness present.     Coordination: Coordination normal.  Psychiatric:        Mood and Affect: Mood normal.        Behavior: Behavior normal.     UC Treatments / Results  Labs (all labs ordered are listed, but only abnormal results are displayed) Labs  Reviewed  GLUCOSE, CAPILLARY - Abnormal; Notable for the following components:      Result Value   Glucose-Capillary 150 (*)    All other components within normal limits    EKG   Radiology No results found.  Procedures ED EKG  Date/Time: 07/13/2021 11:23 AM Performed by: Danton Clap, PA-C Authorized by: Danton Clap, PA-C   ECG reviewed by ED Physician in the absence of a cardiologist: yes   Previous ECG:    Previous ECG:  Compared to current   Similarity:  Changes  noted   Comparison ECG info:  Bradycardia Interpretation:    Interpretation: abnormal   Rate:    ECG rate:  48   ECG rate assessment: bradycardic   Rhythm:    Rhythm: sinus rhythm   QRS:    QRS axis:  Normal   QRS intervals:  Normal   QRS conduction: normal   ST segments:    ST segments:  Normal T waves:    T waves: normal   Q waves:    Abnormal Q-waves: present   Comments:     Marked sinus bradycardia, 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  Pertinent labs & imaging results that were available during my care of the patient were reviewed by me and considered in my medical decision making (see chart for details).  76 year old male with history of COPD, coronary artery disease, previous MI and CABG, diet-controlled diabetes, anxiety, hypertension and hyperlipidemia presents for onset of dizziness, weakness, fatigue and presyncope today.  BP at home was 90/50 and he felt dizzy at that time.  He has intentionally lost 37 pounds in the past 7 weeks.  Has upcoming class reunion and wants to look well for it.  Patient is initial BP is 65/49.  Nursing staff and myself had to help patient onto exam bed as he had difficulty and was presyncopal.  Initial heart rate 67 bpm and oxygen 97%.  Patient was cold, clammy and diaphoretic as well as pale.  Alert and oriented.  Dry mucous membranes, regular rhythm with bradycardia.  Chest clear to auscultation.  Abdomen soft and nontender.  EKG today shows marked sinus bradycardia 48 bpm and old infarct.  No acute changes when compared to previous EKGs.  Blood glucose check is 150.  Repeat BP check is 114/99 with pulse rate of 49.  5 minutes later BP check is 79/58 and pulse continues to range from 43 to 58 bpm.  Oxygen saturation ranges from 91 to 93% but he is not in any  acute respiratory distress.  Patient continues to look pale but is no longer diaphoretic and states that he is feeling better after resting for a little while.  Discussed with patient and wife that he needs further work-up in the ER due to his vital signs.  He is hypotensive, bradycardic and his oxygen is bordering on hypoxia at times.  Patient wants to just go home and get something to eat.  Advised him that that would not fix his vital signs that he really needs to go to the ER for further work-up.  Patient is reluctantly agreeable.  Our nursing staff starts IV access.  Patient evaluated by paramedics and EMS and I also gave report.  Patient leaving in route to ER in stable condition.  Final Clinical Impressions(s) / UC Diagnoses   Final diagnoses:  Hypotension, unspecified hypotension type  Weakness  Bradycardia  Excessive weight loss     Discharge Instructions      You have been advised to follow up immediately in the emergency department for concerning signs.symptoms. If you declined EMS transport, please have a family member take you directly to the ED at this time. Do not delay. Based on concerns about condition, if you do not follow up in th e ED, you may risk poor outcomes including worsening of condition, delayed treatment and potentially life threatening issues. If you have declined to go to the ED at this time, you should call your PCP immediately to set up a follow up appointment.  Go to ED for red flag symptoms, including; fevers you cannot reduce with Tylenol/Motrin, severe headaches, vision changes, numbness/weakness in part of the body, lethargy, confusion, intractable vomiting, severe dehydration, chest pain, breathing difficulty, severe persistent abdominal or pelvic pain, signs of severe infection (increased redness, swelling of an area), feeling faint or passing out, dizziness, etc. You should especially go to the ED for sudden acute worsening of condition if you do not elect  to go at this time.      ED Prescriptions   None    PDMP not reviewed this encounter.   Danton Clap, PA-C 07/13/21 1127

## 2021-07-13 NOTE — ED Notes (Signed)
Purple and green top sent to lab with temp labels.

## 2021-07-13 NOTE — Discharge Instructions (Signed)
Hold your Losartan tomorrow and monitor you blood pressure.  Follow up with your primary care provider this week and schedule a follow up with cardiology.  Increase your intake of fluids.  If you develop any concerning symptoms, return to the ER.

## 2021-07-13 NOTE — ED Triage Notes (Addendum)
Patient states that he started a keto/low carb diet.  Patient states that he has been eating keto bars and only one meal a day.  Patient states that he has lost 37lb over 6-8 weeks.  Patient states that he is on blood pressure medication and has decrease his BP medicine to 1/2 a tablet but now thinks that he might not need to be on his blood pressure medicine.  Patient states that his BP was 90/50 at home this morning with dizziness.  Patient states that he did toke 1/2 tablet of Losartan this morning.  Patient reports some diarrhea that since starting the new diet.  Patient denies N/V.

## 2021-07-13 NOTE — Discharge Instructions (Addendum)

## 2021-07-13 NOTE — ED Notes (Signed)
Dc ppw provided to patient. Pt declines questions at this time. Pt follow up reviewed. Pt provides verbal consent for dc. Pt ambulatory to lobby alert and oriented

## 2021-08-25 ENCOUNTER — Other Ambulatory Visit: Payer: Self-pay | Admitting: Family Medicine

## 2021-08-25 ENCOUNTER — Telehealth: Payer: Self-pay

## 2021-08-25 MED ORDER — GABAPENTIN 300 MG PO CAPS: 600.0000 mg | ORAL_CAPSULE | Freq: Three times a day (TID) | ORAL | 2 refills | Status: DC

## 2021-08-25 NOTE — Telephone Encounter (Signed)
-----   Message from Peggyann Shoals sent at 08/25/2021  8:17 AM EDT ----- Regarding: med refill Contact: [phone] Gabapentin '300mg'$  he takes 2 capsules 3 times aday [facility] [place]

## 2021-10-03 ENCOUNTER — Telehealth: Payer: Self-pay

## 2021-10-03 NOTE — Telephone Encounter (Signed)
-----   Message from Peggyann Shoals sent at 10/03/2021 11:33 AM EDT ----- Regarding: doctor's note Contact: 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 He was told that he needs a doctor's note explaining why he is taking and needs to continue taking Gabapentin. 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 He needs this by 10/17/2021. They will pick up the letter.

## 2021-10-08 ENCOUNTER — Encounter: Payer: Self-pay | Admitting: Neurosurgery

## 2021-10-08 NOTE — Telephone Encounter (Signed)
Patient is aware that letter is ready for pickup.

## 2021-12-08 ENCOUNTER — Ambulatory Visit: Payer: Managed Care, Other (non HMO) | Admitting: Certified Registered Nurse Anesthetist

## 2021-12-08 ENCOUNTER — Ambulatory Visit
Admission: RE | Admit: 2021-12-08 | Discharge: 2021-12-08 | Disposition: A | Discharge status: Home / Self Care | Payer: Managed Care, Other (non HMO) | Source: Ambulatory Visit | Attending: Gastroenterology | Admitting: Gastroenterology

## 2021-12-08 ENCOUNTER — Encounter
Admission: RE | Disposition: A | Discharge status: Home / Self Care | Payer: Self-pay | Source: Ambulatory Visit | Attending: Gastroenterology

## 2021-12-08 ENCOUNTER — Encounter: Payer: Self-pay | Admitting: Gastroenterology

## 2021-12-08 DIAGNOSIS — Z86010 Personal history of colonic polyps: Secondary | ICD-10-CM | POA: Diagnosis present

## 2021-12-08 DIAGNOSIS — I252 Old myocardial infarction: Secondary | ICD-10-CM | POA: Diagnosis not present

## 2021-12-08 DIAGNOSIS — Z87891 Personal history of nicotine dependence: Secondary | ICD-10-CM | POA: Diagnosis not present

## 2021-12-08 DIAGNOSIS — E119 Type 2 diabetes mellitus without complications: Secondary | ICD-10-CM | POA: Insufficient documentation

## 2021-12-08 DIAGNOSIS — Z79899 Other long term (current) drug therapy: Secondary | ICD-10-CM | POA: Diagnosis not present

## 2021-12-08 DIAGNOSIS — E785 Hyperlipidemia, unspecified: Secondary | ICD-10-CM | POA: Diagnosis not present

## 2021-12-08 DIAGNOSIS — Z1211 Encounter for screening for malignant neoplasm of colon: Secondary | ICD-10-CM | POA: Diagnosis not present

## 2021-12-08 DIAGNOSIS — I10 Essential (primary) hypertension: Secondary | ICD-10-CM | POA: Insufficient documentation

## 2021-12-08 DIAGNOSIS — K219 Gastro-esophageal reflux disease without esophagitis: Secondary | ICD-10-CM | POA: Insufficient documentation

## 2021-12-08 DIAGNOSIS — D123 Benign neoplasm of transverse colon: Secondary | ICD-10-CM | POA: Diagnosis not present

## 2021-12-08 DIAGNOSIS — Z951 Presence of aortocoronary bypass graft: Secondary | ICD-10-CM | POA: Diagnosis not present

## 2021-12-08 DIAGNOSIS — J449 Chronic obstructive pulmonary disease, unspecified: Secondary | ICD-10-CM | POA: Insufficient documentation

## 2021-12-08 DIAGNOSIS — I2510 Atherosclerotic heart disease of native coronary artery without angina pectoris: Secondary | ICD-10-CM | POA: Insufficient documentation

## 2021-12-08 DIAGNOSIS — K640 First degree hemorrhoids: Secondary | ICD-10-CM | POA: Insufficient documentation

## 2021-12-08 HISTORY — PX: COLONOSCOPY WITH PROPOFOL: SHX5780

## 2021-12-08 SURGERY — COLONOSCOPY WITH PROPOFOL
Anesthesia: General

## 2021-12-08 MED ORDER — STERILE WATER FOR IRRIGATION IR SOLN
Status: DC | PRN
  Administered 2021-12-08: 60 mL

## 2021-12-08 MED ORDER — LIDOCAINE HCL (PF) 2 % IJ SOLN
INTRAMUSCULAR | Status: AC
  Filled 2021-12-08: qty 5

## 2021-12-08 MED ORDER — PROPOFOL 10 MG/ML IV BOLUS
INTRAVENOUS | Status: DC | PRN
  Administered 2021-12-08: 60 mg via INTRAVENOUS

## 2021-12-08 MED ORDER — GLYCOPYRROLATE 0.2 MG/ML IJ SOLN
INTRAMUSCULAR | Status: AC
  Filled 2021-12-08: qty 1

## 2021-12-08 MED ORDER — SODIUM CHLORIDE 0.9 % IV SOLN: INTRAVENOUS | Status: DC

## 2021-12-08 MED ORDER — PROPOFOL 500 MG/50ML IV EMUL
INTRAVENOUS | Status: DC | PRN
  Administered 2021-12-08: 200 ug/kg/min via INTRAVENOUS

## 2021-12-08 MED ORDER — GLYCOPYRROLATE 0.2 MG/ML IJ SOLN
INTRAMUSCULAR | Status: DC | PRN
  Administered 2021-12-08: .2 mg via INTRAVENOUS

## 2021-12-08 MED ORDER — LIDOCAINE HCL (CARDIAC) PF 100 MG/5ML IV SOSY
PREFILLED_SYRINGE | INTRAVENOUS | Status: DC | PRN
  Administered 2021-12-08: 50 mg via INTRAVENOUS

## 2021-12-08 NOTE — Interval H&P Note (Signed)
History and Physical Interval Note:  12/08/2021 12:40 PM  Zachary J Betzler Sr.  has presented today for surgery, with the diagnosis of HX OF ADENOMATOUS POLYP OF COLON.  The various methods of treatment have been discussed with the patient and family. After consideration of risks, benefits and other options for treatment, the patient has consented to  Procedure(s): COLONOSCOPY WITH PROPOFOL (N/A) as a surgical intervention.  The patient's history has been reviewed, patient examined, no change in status, stable for surgery.  I have reviewed the patient's chart and labs.  Questions were answered to the patient's satisfaction.     Lesly Rubenstein  Ok to proceed with colonoscopy

## 2021-12-08 NOTE — Anesthesia Preprocedure Evaluation (Signed)
Anesthesia Evaluation  Patient identified by MRN, date of birth, ID band Patient awake    Reviewed: Allergy & Precautions, NPO status , Patient's Chart, lab work & pertinent test results  Airway Mallampati: III  TM Distance: >3 FB Neck ROM: Full    Dental  (+) Caps,    Pulmonary COPD, former smoker,    breath sounds clear to auscultation       Cardiovascular Exercise Tolerance: Good hypertension, Pt. on medications + CAD, + Past MI and + CABG   Rhythm:Regular     Neuro/Psych negative neurological ROS     GI/Hepatic GERD  Medicated,  Endo/Other  diabetes, Type 2  Renal/GU      Musculoskeletal   Abdominal Normal abdominal exam  (+)   Peds negative pediatric ROS (+)  Hematology negative hematology ROS (+)   Anesthesia Other Findings   Reproductive/Obstetrics                             Anesthesia Physical Anesthesia Plan  ASA: 3  Anesthesia Plan: General   Post-op Pain Management:    Induction: Intravenous  PONV Risk Score and Plan:   Airway Management Planned: Natural Airway  Additional Equipment:   Intra-op Plan:   Post-operative Plan:   Informed Consent: I have reviewed the patients History and Physical, chart, labs and discussed the procedure including the risks, benefits and alternatives for the proposed anesthesia with the patient or authorized representative who has indicated his/her understanding and acceptance.       Plan Discussed with: CRNA, Anesthesiologist and Surgeon  Anesthesia Plan Comments:         Anesthesia Quick Evaluation

## 2021-12-08 NOTE — H&P (Signed)
Outpatient short stay form Pre-procedure 12/08/2021  Lesly Rubenstein, MD  Primary Physician: [place] Pink, MD  Reason for visit:  Surveillance colonoscopy  History of present illness:    76 y/o gentleman with history of hypertension and hld here for surveillance colonoscopy. Last colonoscopy in 2019. No significant abdominal surgeries. No blood thinners. No family history of GI malignancies.    Current Facility-Administered Medications:    0.9 %  sodium chloride infusion, , Intravenous, Continuous, Inioluwa Boulay, Hilton Cork, MD, Last Rate: 20 mL/hr at 12/08/21 1229, New Bag at 12/08/21 1229  Medications Prior to Admission  Medication Sig Dispense Refill Last Dose   Fluticasone-Salmeterol (ADVAIR) 100-50 MCG/DOSE AEPB Inhale 1 puff into the lungs 2 (two) times daily.   12/08/2021   acetaminophen (TYLENOL) 500 MG tablet Take 2 tablets (1,000 mg total) by mouth every 6 (six) hours. 30 tablet 0    albuterol (PROVENTIL HFA;VENTOLIN HFA) 108 (90 Base) MCG/ACT inhaler Inhale 2 puffs into the lungs every 4 (four) hours as needed for wheezing or shortness of breath.      Alpha-Lipoic Acid 200 MG CAPS Take 200 mg by mouth daily.       alum & mag hydroxide-simeth (MAALOX/MYLANTA) 200-200-20 MG/5ML suspension Take 30 mLs by mouth every 6 (six) hours as needed for indigestion. 355 mL 0    Ascorbic Acid (VITAMIN C) 1000 MG tablet Take 1,000 mg by mouth daily.      aspirin EC 81 MG tablet Take 81 mg by mouth daily. Swallow whole.   12/06/2021   bisacodyl (DULCOLAX) 5 MG EC tablet Take 1 tablet (5 mg total) by mouth daily as needed for moderate constipation. 30 tablet 0    Cholecalciferol (VITAMIN D3 PO) Take 2 tablets by mouth daily.      Cinnamon 500 MG TABS Take 500 mg by mouth daily.       COENZYME Q-10 PO Take 1 capsule by mouth daily.      docusate sodium (COLACE) 100 MG capsule Take 100 mg by mouth daily.      escitalopram (LEXAPRO) 10 MG tablet Take 10 mg by mouth at bedtime.    12/06/2021    gabapentin (NEURONTIN) 100 MG capsule Take 1 capsule (100 mg total) by mouth 2 (two) times daily. 60 capsule 0    gabapentin (NEURONTIN) 300 MG capsule Take 2 capsules (600 mg total) by mouth 3 (three) times daily. 180 capsule 2 11/29/2021   ipratropium-albuterol (DUONEB) 0.5-2.5 (3) MG/3ML SOLN Take 3 mLs by nebulization every 6 (six) hours as needed (shortness of breath).       losartan (COZAAR) 100 MG tablet Take 50 mg by mouth daily.   11/29/2021   lovastatin (MEVACOR) 40 MG tablet Take 40 mg by mouth at bedtime.   12/06/2021   menthol-cetylpyridinium (CEPACOL) 3 MG lozenge Take 1 lozenge (3 mg total) by mouth as needed for sore throat (sore throat). 100 tablet 12    methocarbamol (ROBAXIN) 750 MG tablet Take 1 tablet (750 mg total) by mouth every 6 (six) hours. 80 tablet 0    Multiple Vitamin (MULTIVITAMIN) tablet Take 1 tablet by mouth daily.      omeprazole (PRILOSEC) 40 MG capsule Take 40 mg by mouth daily.       oxyCODONE (ROXICODONE) 5 MG immediate release tablet Take 1 tablet (5 mg total) by mouth every 4 (four) hours as needed for moderate pain or severe pain ('5mg'$  for moderate pain (3-6/10), '10mg'$  for severe pain (7-10/10)). 30 tablet 0  phenol (CHLORASEPTIC) 1.4 % LIQD Use as directed 1 spray in the mouth or throat as needed for throat irritation / pain.  0    polyethylene glycol (MIRALAX / GLYCOLAX) 17 g packet Take 17 g by mouth daily.      senna (SENOKOT) 8.6 MG TABS tablet Take 2 tablets (17.2 mg total) by mouth 2 (two) times daily. 120 tablet 0    Turmeric 400 MG CAPS Take 400 mg by mouth daily.         Allergies  Allergen Reactions   Atorvastatin Palpitations   Rosuvastatin Palpitations     Past Medical History:  Diagnosis Date   Anxiety    Arthritis    COPD (chronic obstructive pulmonary disease) ([place])    Coronary artery disease    Diabetes mellitus without complication (HCC)    diet controlled   GERD (gastroesophageal reflux disease)    Hemorrhoids    History  of colon polyps    Hx of seasonal allergies    Hyperlipidemia    Hypertension    MRSA infection    a sore on face   Myocardial infarction ([place]) 2010   Pneumonia     Review of systems:  Otherwise negative.    Physical Exam  Gen: Alert, oriented. Appears stated age.  HEENT: PERRLA. Lungs: No respiratory distress CV: RRR Abd: soft, benign, no masses Ext: No edema    Planned procedures: Proceed with colonoscopy. The patient understands the nature of the planned procedure, indications, risks, alternatives and potential complications including but not limited to bleeding, infection, perforation, damage to internal organs and possible oversedation/side effects from anesthesia. The patient agrees and gives consent to proceed.  Please refer to procedure notes for findings, recommendations and patient disposition/instructions.     Lesly Rubenstein, MD [facility]

## 2021-12-08 NOTE — Transfer of Care (Signed)
Immediate Anesthesia Transfer of Care Note  Patient: Zachary HOUDESHELL Sr.  Procedure(s) Performed: COLONOSCOPY WITH PROPOFOL  Patient Location: PACU  Anesthesia Type:General  Level of Consciousness: awake, alert  and oriented  Airway & Oxygen Therapy: Patient Spontanous Breathing  Post-op Assessment: Report given to RN and Post -op Vital signs reviewed and stable  Post vital signs: Reviewed and stable  Last Vitals:  Vitals Value Taken Time  BP 102/68 12/08/21 1311  Temp    Pulse 54 12/08/21 1311  Resp 32 12/08/21 1311  SpO2 98 % 12/08/21 1311  Vitals shown include unvalidated device data.  Last Pain:  Vitals:   12/08/21 1211  TempSrc: Temporal  PainSc: 0-No pain         Complications: No notable events documented.

## 2021-12-08 NOTE — Anesthesia Postprocedure Evaluation (Signed)
Anesthesia Post Note  Patient: Zachary Trevino.  Procedure(s) Performed: COLONOSCOPY WITH PROPOFOL  Anesthesia Type: General Anesthetic complications: no   No notable events documented.   Last Vitals:  Vitals:   12/08/21 1211 12/08/21 1311  BP: 117/62 102/68  Pulse: (!) 50 (!) 54  Resp: 18 (!) 32  Temp: (!) 35.9 C (!) 36 C  SpO2: 97% 98%    Last Pain:  Vitals:   12/08/21 1311  TempSrc: Temporal  PainSc: 0-No pain                 VAN STAVEREN,Gitty Osterlund

## 2021-12-08 NOTE — Addendum Note (Signed)
Addendum  created 12/08/21 1326 by [place] [facility], Jane Canary, MD   Attestation recorded in [place], [place] accepted, Intraprocedure Attestations filed

## 2021-12-08 NOTE — Op Note (Signed)
[facility] Patient Name: Zachary Trevino Procedure Date: 12/08/2021 12:36 PM MRN: [id number] Account #: [id number] Date of Birth: 07/03/1945 Admit Type: Outpatient Age: 76 Room: [facility] ENDO ROOM 3 Gender: Male Note Status: Finalized Instrument Name: Jasper Riling [id number] Procedure:             Colonoscopy Indications:           Surveillance: Personal history of adenomatous polyps                         on last colonoscopy > 3 years ago Providers:             Andrey Farmer MD, MD Referring MD:          Irven Easterly. Kary Kos, MD (Referring MD) Medicines:             Monitored Anesthesia Care Complications:         No immediate complications. Estimated blood loss:                         Minimal. Procedure:             Pre-Anesthesia Assessment:                        - Prior to the procedure, a History and Physical was                         performed, and patient medications and allergies were                         reviewed. The patient is competent. The risks and                         benefits of the procedure and the sedation options and                         risks were discussed with the patient. All questions                         were answered and informed consent was obtained.                         Patient identification and proposed procedure were                         verified by the physician, the nurse, the                         anesthesiologist, the anesthetist and the technician                         in the endoscopy suite. Mental Status Examination:                         alert and oriented. Airway Examination: normal                         oropharyngeal airway and neck mobility. Respiratory  Examination: clear to auscultation. CV Examination:                         normal. Prophylactic Antibiotics: The patient does not                         require prophylactic antibiotics. Prior                          Anticoagulants: The patient has taken no previous                         anticoagulant or antiplatelet agents. ASA Grade                         Assessment: II - A patient with mild systemic disease.                         After reviewing the risks and benefits, the patient                         was deemed in satisfactory condition to undergo the                         procedure. The anesthesia plan was to use monitored                         anesthesia care (MAC). Immediately prior to                         administration of medications, the patient was                         re-assessed for adequacy to receive sedatives. The                         heart rate, respiratory rate, oxygen saturations,                         blood pressure, adequacy of pulmonary ventilation, and                         response to care were monitored throughout the                         procedure. The physical status of the patient was                         re-assessed after the procedure.                        After obtaining informed consent, the colonoscope was                         passed under direct vision. Throughout the procedure,                         the patient's blood pressure, pulse, and oxygen  saturations were monitored continuously. The                         Colonoscope was introduced through the anus and                         advanced to the the cecum, identified by appendiceal                         orifice and ileocecal valve. The colonoscopy was                         somewhat difficult due to a redundant colon.                         Successful completion of the procedure was aided by                         applying abdominal pressure. The patient tolerated the                         procedure well. The quality of the bowel preparation                         was good. Findings:      The perianal and digital rectal examinations  were normal.      A 3 mm polyp was found in the hepatic flexure. The polyp was sessile.       The polyp was removed with a cold snare. Resection and retrieval were       complete. Estimated blood loss was minimal.      Internal hemorrhoids were found during retroflexion. The hemorrhoids       were Grade I (internal hemorrhoids that do not prolapse).      The exam was otherwise without abnormality on direct and retroflexion       views. Impression:            - One 3 mm polyp at the hepatic flexure, removed with                         a cold snare. Resected and retrieved.                        - Internal hemorrhoids.                        - The examination was otherwise normal on direct and                         retroflexion views. Recommendation:        - Discharge patient to home.                        - Resume previous diet.                        - Continue present medications.                        - Await pathology results.                        -  Repeat colonoscopy is not recommended due to current                         age (75 years or older) for surveillance.                        - Return to referring physician as previously                         scheduled. Procedure Code(s):     --- Professional ---                        [phone], Colonoscopy, flexible; with removal of                         tumor(s), polyp(s), or other lesion(s) by snare                         technique Diagnosis Code(s):     --- Professional ---                        Z86.010, Personal history of colonic polyps                        K63.5, Polyp of colon                        K64.0, First degree hemorrhoids CPT copyright 2019 American Medical [employer]. All rights reserved. The codes documented in this report are preliminary and upon coder review may  be revised to meet current compliance requirements. Andrey Farmer MD, MD 12/08/2021 1:10:46 PM Number of Addenda: 0 Note Initiated On:  12/08/2021 12:36 PM Scope Withdrawal Time: 0 hours 11 minutes 28 seconds  Total Procedure Duration: 0 hours 18 minutes 30 seconds  Estimated Blood Loss:  Estimated blood loss was minimal.      [facility]

## 2021-12-08 NOTE — Anesthesia Procedure Notes (Signed)
Procedure Name: MAC Date/Time: 12/08/2021 12:39 PM  Performed by: Tollie Eth, CRNAPre-anesthesia Checklist: Patient identified, Emergency Drugs available, Suction available and Patient being monitored Patient Re-evaluated:Patient Re-evaluated prior to induction Oxygen Delivery Method: Nasal cannula Induction Type: IV induction Placement Confirmation: positive ETCO2

## 2021-12-09 LAB — SURGICAL PATHOLOGY

## 2022-01-03 ENCOUNTER — Emergency Department
Admission: EM | Admit: 2022-01-03 | Discharge: 2022-01-03 | Disposition: A | Discharge status: Home / Self Care | Payer: Managed Care, Other (non HMO) | Attending: Emergency Medicine | Admitting: Emergency Medicine

## 2022-01-03 ENCOUNTER — Other Ambulatory Visit: Payer: Self-pay

## 2022-01-03 DIAGNOSIS — Y92007 Garden or yard of unspecified non-institutional (private) residence as the place of occurrence of the external cause: Secondary | ICD-10-CM | POA: Diagnosis not present

## 2022-01-03 DIAGNOSIS — S41111A Laceration without foreign body of right upper arm, initial encounter: Secondary | ICD-10-CM | POA: Diagnosis not present

## 2022-01-03 DIAGNOSIS — S01312A Laceration without foreign body of left ear, initial encounter: Secondary | ICD-10-CM | POA: Insufficient documentation

## 2022-01-03 DIAGNOSIS — J449 Chronic obstructive pulmonary disease, unspecified: Secondary | ICD-10-CM | POA: Diagnosis not present

## 2022-01-03 DIAGNOSIS — Z23 Encounter for immunization: Secondary | ICD-10-CM | POA: Insufficient documentation

## 2022-01-03 DIAGNOSIS — Z79899 Other long term (current) drug therapy: Secondary | ICD-10-CM | POA: Diagnosis not present

## 2022-01-03 DIAGNOSIS — W01118A Fall on same level from slipping, tripping and stumbling with subsequent striking against other sharp object, initial encounter: Secondary | ICD-10-CM | POA: Diagnosis not present

## 2022-01-03 DIAGNOSIS — Z7982 Long term (current) use of aspirin: Secondary | ICD-10-CM | POA: Diagnosis not present

## 2022-01-03 DIAGNOSIS — I10 Essential (primary) hypertension: Secondary | ICD-10-CM | POA: Insufficient documentation

## 2022-01-03 DIAGNOSIS — E119 Type 2 diabetes mellitus without complications: Secondary | ICD-10-CM | POA: Insufficient documentation

## 2022-01-03 DIAGNOSIS — Z7951 Long term (current) use of inhaled steroids: Secondary | ICD-10-CM | POA: Diagnosis not present

## 2022-01-03 DIAGNOSIS — S41112A Laceration without foreign body of left upper arm, initial encounter: Secondary | ICD-10-CM | POA: Insufficient documentation

## 2022-01-03 MED ORDER — AMOXICILLIN-POT CLAVULANATE 875-125 MG PO TABS: 1.0000 | ORAL_TABLET | Freq: Two times a day (BID) | ORAL | 0 refills | Status: AC

## 2022-01-03 MED ORDER — TETANUS-DIPHTHERIA TOXOIDS TD 5-2 LFU IM INJ
0.5000 mL | INJECTION | Freq: Once | INTRAMUSCULAR | Status: AC
  Administered 2022-01-03: 0.5 mL via INTRAMUSCULAR
  Filled 2022-01-03 (×2): qty 0.5

## 2022-01-03 MED ORDER — LIDOCAINE HCL (PF) 1 % IJ SOLN
5.0000 mL | Freq: Once | INTRAMUSCULAR | Status: AC
  Administered 2022-01-03: 5 mL
  Filled 2022-01-03: qty 5

## 2022-01-03 MED ORDER — HYDROCODONE-ACETAMINOPHEN 5-325 MG PO TABS: 1.0000 | ORAL_TABLET | Freq: Four times a day (QID) | ORAL | 0 refills | Status: DC | PRN

## 2022-01-03 MED ORDER — AMOXICILLIN-POT CLAVULANATE 875-125 MG PO TABS
1.0000 | ORAL_TABLET | Freq: Once | ORAL | Status: AC
  Administered 2022-01-03: 1 via ORAL
  Filled 2022-01-03: qty 1

## 2022-01-03 MED ORDER — LIDOCAINE HCL (PF) 1 % IJ SOLN
10.0000 mL | Freq: Once | INTRAMUSCULAR | Status: AC
  Administered 2022-01-03: 10 mL
  Filled 2022-01-03: qty 10

## 2022-01-03 MED ORDER — HYDROCODONE-ACETAMINOPHEN 5-325 MG PO TABS
1.0000 | ORAL_TABLET | ORAL | Status: AC
  Administered 2022-01-03: 1 via ORAL
  Filled 2022-01-03: qty 1

## 2022-01-03 NOTE — ED Triage Notes (Signed)
Pt reports stepped on the edge of a trash can and it came up and cut his left ear

## 2022-01-03 NOTE — ED Provider Notes (Signed)
[place] EMERGENCY DEPARTMENT Provider Note   CSN: [id number] Arrival date & time: 01/03/22  1402     History  Chief Complaint  Patient presents with   Ear Laceration    Zachary KLEMZ Sr. is a 76 y.o. male presents to the emergency department for evaluation of laceration to the left ear.  Just prior to arrival patient was pulling his trash can and 1 hand as well as his lawnmower and the other when he somehow fell landing on the lid of the trash can, the corner of the trash can hit him in the left ear causing complex laceration to the left ear.  He denies any headache, head injury, nausea, vomiting, neck pain.  He has had some abrasions to the dorsal aspect of both forearms but denies any other pain or injury throughout his body.  He denies any difficulty hearing.  His tetanus status is unknown.  He takes aspirin daily, has a history of coronary artery disease, COPD, prior myocardial infarction, diabetes, hypertension.  HPI     Home Medications Prior to Admission medications   Medication Sig Start Date End Date Taking? Authorizing Provider  amoxicillin-clavulanate (AUGMENTIN) 875-125 MG tablet Take 1 tablet by mouth 2 (two) times daily for 7 days. 01/03/22 01/10/22 Yes Duanne Guess, PA-C  HYDROcodone-acetaminophen (NORCO) 5-325 MG tablet Take 1 tablet by mouth every 6 (six) hours as needed for moderate pain. 01/03/22  Yes Duanne Guess, PA-C  acetaminophen (TYLENOL) 500 MG tablet Take 2 tablets (1,000 mg total) by mouth every 6 (six) hours. 09/28/19   Lonell Face, NP  albuterol (PROVENTIL HFA;VENTOLIN HFA) 108 (90 Base) MCG/ACT inhaler Inhale 2 puffs into the lungs every 4 (four) hours as needed for wheezing or shortness of breath.    [provider]  Alpha-Lipoic Acid 200 MG CAPS Take 200 mg by mouth daily.     [provider]  alum & mag hydroxide-simeth (MAALOX/MYLANTA) 200-200-20 MG/5ML suspension Take 30 mLs by mouth every 6  (six) hours as needed for indigestion. 09/28/19   Lonell Face, NP  Ascorbic Acid (VITAMIN C) 1000 MG tablet Take 1,000 mg by mouth daily.    [provider]  aspirin EC 81 MG tablet Take 81 mg by mouth daily. Swallow whole.    [provider]  bisacodyl (DULCOLAX) 5 MG EC tablet Take 1 tablet (5 mg total) by mouth daily as needed for moderate constipation. 09/28/19   Lonell Face, NP  Cholecalciferol (VITAMIN D3 PO) Take 2 tablets by mouth daily.    [provider]  Cinnamon 500 MG TABS Take 500 mg by mouth daily.     [provider]  COENZYME Q-10 PO Take 1 capsule by mouth daily.    [provider]  docusate sodium (COLACE) 100 MG capsule Take 100 mg by mouth daily.    [provider]  escitalopram (LEXAPRO) 10 MG tablet Take 10 mg by mouth at bedtime.     [provider]  Fluticasone-Salmeterol (ADVAIR) 100-50 MCG/DOSE AEPB Inhale 1 puff into the lungs 2 (two) times daily.    [provider]  gabapentin (NEURONTIN) 100 MG capsule Take 1 capsule (100 mg total) by mouth 2 (two) times daily. 09/28/19   Lonell Face, NP  gabapentin (NEURONTIN) 300 MG capsule Take 2 capsules (600 mg total) by mouth 3 (three) times daily. 08/25/21   Loleta Dicker, PA  ipratropium-albuterol (DUONEB) 0.5-2.5 (3) MG/3ML SOLN Take 3 mLs by nebulization every 6 (  six) hours as needed (shortness of breath).     [provider]  losartan (COZAAR) 100 MG tablet Take 50 mg by mouth daily.    [provider]  lovastatin (MEVACOR) 40 MG tablet Take 40 mg by mouth at bedtime.    [provider]  menthol-cetylpyridinium (CEPACOL) 3 MG lozenge Take 1 lozenge (3 mg total) by mouth as needed for sore throat (sore throat). 09/28/19   Lonell Face, NP  methocarbamol (ROBAXIN) 750 MG tablet Take 1 tablet (750 mg total) by mouth every 6 (six) hours. 09/28/19   Lonell Face, NP  Multiple Vitamin (MULTIVITAMIN) tablet Take 1  tablet by mouth daily.    [provider]  omeprazole (PRILOSEC) 40 MG capsule Take 40 mg by mouth daily.  02/17/13   [provider]  oxyCODONE (ROXICODONE) 5 MG immediate release tablet Take 1 tablet (5 mg total) by mouth every 4 (four) hours as needed for moderate pain or severe pain ('5mg'$  for moderate pain (3-6/10), '10mg'$  for severe pain (7-10/10)). 09/28/19   Lonell Face, NP  phenol (CHLORASEPTIC) 1.4 % LIQD Use as directed 1 spray in the mouth or throat as needed for throat irritation / pain. 09/28/19   Lonell Face, NP  polyethylene glycol (MIRALAX / GLYCOLAX) 17 g packet Take 17 g by mouth daily.    [provider]  senna (SENOKOT) 8.6 MG TABS tablet Take 2 tablets (17.2 mg total) by mouth 2 (two) times daily. 09/28/19   Lonell Face, NP  Turmeric 400 MG CAPS Take 400 mg by mouth daily.     [provider]      Allergies    Atorvastatin and Rosuvastatin    Review of Systems   Review of Systems  Physical Exam Updated Vital Signs BP (!) 161/93 (BP Location: Right Arm)   Pulse 100   Temp 98.2 F (36.8 C) (Oral)   Resp 20   Ht 6' (1.829 m)   Wt 93 kg   SpO2 93%   BMI 27.80 kg/m  Physical Exam Constitutional:      Appearance: He is well-developed.  HENT:     Head: Normocephalic.     Comments: Complex laceration to the superior aspect of the left ear, entire top of the ear, helix has been completely removed along with a complete transverse laceration through the cartilage along the top third of the left ear.  Entire piece of cartilage is dangling and barely attached.  Numerous lacerations/flaps along superior soft tissue of the ear.  Ear bleeding controlled.  Canal normal, no sign of internal bleeding.  Patient with no tenderness to palpation throughout the scalp or facial bones. Eyes:     Conjunctiva/sclera: Conjunctivae normal.  Cardiovascular:     Rate and Rhythm: Normal rate.  Pulmonary:     Effort: Pulmonary effort is normal. No  respiratory distress.  Musculoskeletal:        General: Normal range of motion.     Cervical back: Normal range of motion.     Comments: Nontender throughout the cervical thoracic or lumbar spine.  Nontender throughout the shoulders elbows hips, knees.  Patient ambulatory with no antalgic gait.  Skin:    General: Skin is warm.     Findings: No rash.     Comments: Dorsal aspect of both forearms with superficial skin tears.  These were thoroughly cleansed and repaired with Dermabond.  Neurological:     Mental Status: He is alert and oriented to person, place, and time.  Psychiatric:        Behavior: Behavior normal.        Thought Content: Thought content normal.            ED Results / Procedures / Treatments   Labs (all labs ordered are listed, but only abnormal results are displayed) Labs Reviewed - No data to display  EKG None  Radiology No results found.  Procedures .Marland KitchenLaceration Repair  Date/Time: 01/03/2022 7:39 PM  Performed by: Duanne Guess, PA-C Authorized by: Duanne Guess, PA-C   Consent:    Consent obtained:  Verbal   Consent given by:  Patient   Risks discussed:  Need for additional repair, poor cosmetic result and poor wound healing   Alternatives discussed:  No treatment Universal protocol:    Patient identity confirmed:  Verbally with patient Anesthesia:    Anesthesia method:  Local infiltration   Local anesthetic:  Lidocaine 1% w/o epi Laceration details:    Location:  Ear   Ear location:  L ear   Length (cm):  5   Depth (mm):  3 Pre-procedure details:    Preparation:  Patient was prepped and draped in usual sterile fashion Exploration:    Hemostasis achieved with:  Direct pressure   Wound exploration: wound explored through full range of motion     Wound extent comment:  Laceration extends through the cartilage, cartilage completely removed from the soft tissues of the ear.   Contaminated: no   Treatment:    Area cleansed with:   Povidone-iodine and saline   Amount of cleaning:  Standard   Irrigation solution:  Sterile saline   Irrigation method:  Pressure wash and syringe   Debridement:  None Skin repair:    Repair method:  Sutures   Suture size:  6-0   Suture material:  Prolene   Suture technique:  Simple interrupted   Number of sutures:  15 Approximation:    Approximation:  Close Repair type:    Repair type:  Complex Post-procedure details:    Dressing:  Bulky dressing (Vaseline gauze along with a bulky dressing)   Procedure completion:  Tolerated well, no immediate complications Comments:     Patient also with superficial skin tears to the dorsal aspect of both forearms.  Skin was cleansed with Betadine and saline and Dermabond applied.     Medications Ordered in ED Medications  tetanus & diphtheria toxoids (adult) (TENIVAC) injection 0.5 mL (0.5 mLs Intramuscular Given 01/03/22 1527)  HYDROcodone-acetaminophen (NORCO/VICODIN) 5-325 MG per tablet 1 tablet (1 tablet Oral Given 01/03/22 1501)  lidocaine (PF) (XYLOCAINE) 1 % injection 5 mL (5 mLs Infiltration Given by Other 01/03/22 1517)  amoxicillin-clavulanate (AUGMENTIN) 875-125 MG per tablet 1 tablet (1 tablet Oral Given 01/03/22 1517)  lidocaine (PF) (XYLOCAINE) 1 % injection 10 mL (10 mLs Infiltration Given by Other 01/03/22 1645)    ED Course/ Medical Decision Making/ A&P                           Medical Decision Making Risk Prescription drug management.  Final Clinical Impression(s) / ED Diagnoses Final diagnoses:  Complex laceration of left ear, initial encounter  Skin tear of left upper arm without complication, initial encounter  Skin tear of right upper arm without complication, initial encounter  76 year old male with complex laceration to the left ear with damage to the cartilage.  Discussed case with ENT improperly to close/repair laceration.  Cartilage completely lacerated and removed.  Remaining soft tissue repaired.  Tetanus  updated, patient placed on prophylactic antibiotics and given pain medication.  Skin tears to the dorsal aspect of both forearms are repaired with Dermabond.  Patient will follow-up with ENT Monday.  He understands signs and symptoms return to the ER for.  Rx / DC Orders ED Discharge Orders          Ordered    amoxicillin-clavulanate (AUGMENTIN) 875-125 MG tablet  2 times daily        01/03/22 1653    HYDROcodone-acetaminophen (NORCO) 5-325 MG tablet  Every 6 hours PRN        01/03/22 1653              Renata Caprice 01/03/22 1947    Delman Kitten, MD 01/03/22 2102

## 2022-01-03 NOTE — ED Notes (Signed)
Pt to ED with wife for laceration to L ear, bleeding controlled, fell on plastic trash can today. Laceration is not well approximated. Also has skin avulsions to R forearm and L hand. Has been seen by provider.

## 2022-01-03 NOTE — Discharge Instructions (Signed)
Please keep bulky dressing over the left ear until you follow-up with ENT physician.  Call ENT office first thing Monday morning, Dr. Lula Olszewski will want to see you Monday morning in his office.  Take antibiotic as prescribed.  Use pain medication as needed for severe pain.  Keep Dermabond clean and dry for 5 days.  Return to the ER for any worsening symptoms or any urgent changes in health

## 2022-01-22 ENCOUNTER — Telehealth: Payer: Self-pay

## 2022-01-22 MED ORDER — GABAPENTIN 300 MG PO CAPS: 600.0000 mg | ORAL_CAPSULE | Freq: Three times a day (TID) | ORAL | 11 refills | Status: AC

## 2022-01-22 NOTE — Telephone Encounter (Signed)
-----   Message from Peggyann Shoals sent at 01/22/2022 10:07 AM EST ----- Regarding: med refill Contact: [phone] Gabapentin '600mg'$  3 times aday [facility] [place]

## 2022-01-22 NOTE — Telephone Encounter (Signed)
Please advise if OK to refill and if so, how many refills. The last time he saw you was 07/01/21.Marland KitchenMarland Kitchen

## 2022-01-22 NOTE — Telephone Encounter (Signed)
Refills sent for 1 year. Notified pt's wife that Dr Izora Ribas said that we can refill for 1 year, but that he should request future refills from his PCP if he is still doing well when due for refill again 01/2023.

## 2022-03-07 ENCOUNTER — Encounter: Payer: Self-pay | Admitting: Emergency Medicine

## 2022-03-07 ENCOUNTER — Ambulatory Visit
Admission: EM | Admit: 2022-03-07 | Discharge: 2022-03-07 | Disposition: A | Discharge status: Home / Self Care | Payer: Managed Care, Other (non HMO) | Attending: Emergency Medicine | Admitting: Emergency Medicine

## 2022-03-07 DIAGNOSIS — W5503XA Scratched by cat, initial encounter: Secondary | ICD-10-CM

## 2022-03-07 DIAGNOSIS — S60512A Abrasion of left hand, initial encounter: Secondary | ICD-10-CM | POA: Diagnosis not present

## 2022-03-07 DIAGNOSIS — L089 Local infection of the skin and subcutaneous tissue, unspecified: Secondary | ICD-10-CM | POA: Diagnosis not present

## 2022-03-07 DIAGNOSIS — S61432A Puncture wound without foreign body of left hand, initial encounter: Secondary | ICD-10-CM

## 2022-03-07 MED ORDER — PREDNISONE 20 MG PO TABS: 40.0000 mg | ORAL_TABLET | Freq: Every day | ORAL | 0 refills | Status: DC

## 2022-03-07 MED ORDER — AMOXICILLIN-POT CLAVULANATE 875-125 MG PO TABS: 1.0000 | ORAL_TABLET | Freq: Two times a day (BID) | ORAL | 0 refills | Status: DC

## 2022-03-07 NOTE — ED Provider Notes (Addendum)
[facility]    CSN: [id number] Arrival date & time: 03/07/22  1059      History   Chief Complaint Chief Complaint  Patient presents with   Hand Swelling    left   Cellulitis    left    HPI Zachary BUSS Sr. is a 77 y.o. male.   Patient presents for evaluation of left hand, pain, swelling, erythema and warmth beginning 3 days ago.  Endorses he was scratched by his pet cat 7 days ago.  Thinks some drainage beginning 1 day ago at puncture site.  Up-to-date on vaccines.  Initially has cleansed the wound.  Attempted use of 500 mg amoxicillin tablets, started medication 1 day ago, has not seen improvement yet.  Denies numbness or tingling, fevers.   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    Past Surgical History:  Procedure Laterality Date   ANTERIOR CERVICAL DECOMP/DISCECTOMY FUSION N/A 09/27/2019   Procedure: ANTERIOR CERVICAL DECOMPRESSION/DISCECTOMY FUSION 3 LEVELS C4-7;  Surgeon: Meade Maw, MD;  Location: [facility] ORS;  Service: Neurosurgery;  Laterality: N/A;   CERVICAL SPINE SURGERY     COLONOSCOPY WITH ESOPHAGOGASTRODUODENOSCOPY (EGD)     COLONOSCOPY WITH PROPOFOL N/A 09/06/2017   Procedure: COLONOSCOPY WITH PROPOFOL;  Surgeon: Lollie Sails, MD;  Location: [facility] ENDOSCOPY;  Service: Endoscopy;  Laterality: N/A;   COLONOSCOPY WITH PROPOFOL N/A 12/08/2021   Procedure: COLONOSCOPY WITH PROPOFOL;  Surgeon: Lesly Rubenstein, MD;  Location: [facility]  ENDOSCOPY;  Service: Endoscopy;  Laterality: N/A;   CORONARY ARTERY BYPASS GRAFT  2010   3 vessel   EYE SURGERY     HEMORRHOID SURGERY  20 years ago   twice   NASAL SEPTOPLASTY W/ TURBINOPLASTY Bilateral 11/04/2017   Procedure: NASAL SEPTOPLASTY WITH INFERIOR TURBINATE REDUCTION;  Surgeon: Margaretha Sheffield, MD;  Location: [place];  Service: ENT;  Laterality: Bilateral;   RETINAL DETACHMENT SURGERY Left 08/2019   RETINAL TEAR REPAIR CRYOTHERAPY Right    VEIN BYPASS SURGERY  04/2008   3 vessel with vein graft and stent placement       Home Medications    Prior to Admission medications   Medication Sig Start Date End Date Taking? Authorizing Provider  amoxicillin-clavulanate (AUGMENTIN) 875-125 MG tablet Take 1 tablet by mouth every 12 (twelve) hours. 03/07/22  Yes Kamelia Lampkins R, NP  predniSONE (DELTASONE) 20 MG tablet Take 2 tablets (40 mg total) by mouth daily. 03/07/22  Yes Kelli Egolf, Leitha Schuller, NP  acetaminophen (TYLENOL) 500 MG tablet Take 2 tablets (1,000 mg total) by mouth every 6 (six) hours. 09/28/19   Lonell Face, NP  albuterol (PROVENTIL HFA;VENTOLIN HFA) 108 (90 Base) MCG/ACT inhaler Inhale 2 puffs into the lungs every 4 (four) hours as needed for wheezing or shortness of breath.    [provider]  Alpha-Lipoic Acid 200 MG CAPS Take 200 mg  by mouth daily.     [provider]  alum & mag hydroxide-simeth (MAALOX/MYLANTA) 200-200-20 MG/5ML suspension Take 30 mLs by mouth every 6 (six) hours as needed for indigestion. 09/28/19   Lonell Face, NP  Ascorbic Acid (VITAMIN C) 1000 MG tablet Take 1,000 mg by mouth daily.    [provider]  aspirin EC 81 MG tablet Take 81 mg by mouth daily. Swallow whole.    [provider]  bisacodyl (DULCOLAX) 5 MG EC tablet Take 1 tablet (5 mg total) by mouth daily as needed for moderate constipation. 09/28/19   Lonell Face, NP  Cholecalciferol (VITAMIN D3 PO) Take 2 tablets by mouth daily.     [provider]  Cinnamon 500 MG TABS Take 500 mg by mouth daily.     [provider]  COENZYME Q-10 PO Take 1 capsule by mouth daily.    [provider]  docusate sodium (COLACE) 100 MG capsule Take 100 mg by mouth daily.    [provider]  escitalopram (LEXAPRO) 10 MG tablet Take 10 mg by mouth at bedtime.     [provider]  Fluticasone-Salmeterol (ADVAIR) 100-50 MCG/DOSE AEPB Inhale 1 puff into the lungs 2 (two) times daily.    [provider]  gabapentin (NEURONTIN) 300 MG capsule Take 2 capsules (600 mg total) by mouth 3 (three) times daily. 01/22/22 01/22/23  Meade Maw, MD  HYDROcodone-acetaminophen (NORCO) 5-325 MG tablet Take 1 tablet by mouth every 6 (six) hours as needed for moderate pain. 01/03/22   Duanne Guess, PA-C  ipratropium-albuterol (DUONEB) 0.5-2.5 (3) MG/3ML SOLN Take 3 mLs by nebulization every 6 (six) hours as needed (shortness of breath).     [provider]  losartan (COZAAR) 100 MG tablet Take 50 mg by mouth daily.    [provider]  lovastatin (MEVACOR) 40 MG tablet Take 40 mg by mouth at bedtime.    [provider]  menthol-cetylpyridinium (CEPACOL) 3 MG lozenge Take 1 lozenge (3 mg total) by mouth as needed for sore throat (sore throat). 09/28/19   Lonell Face, NP  methocarbamol (ROBAXIN) 750 MG tablet Take 1 tablet (750 mg total) by mouth every 6 (six) hours. 09/28/19   Lonell Face, NP  Multiple Vitamin (MULTIVITAMIN) tablet Take 1 tablet by mouth daily.    [provider]  omeprazole (PRILOSEC) 40 MG capsule Take 40 mg by mouth daily.  02/17/13   [provider]  oxyCODONE (ROXICODONE) 5 MG immediate release tablet Take 1 tablet (5 mg total) by mouth every 4 (four) hours as needed for moderate pain or severe pain ('5mg'$  for moderate pain (3-6/10), '10mg'$  for severe pain (7-10/10)). 09/28/19   Lonell Face, NP  phenol (CHLORASEPTIC) 1.4 % LIQD Use as  directed 1 spray in the mouth or throat as needed for throat irritation / pain. 09/28/19   Lonell Face, NP  polyethylene glycol (MIRALAX / GLYCOLAX) 17 g packet Take 17 g by mouth daily.    [provider]  senna (SENOKOT) 8.6 MG TABS tablet Take 2 tablets (17.2 mg total) by mouth 2 (two) times daily. 09/28/19   Lonell Face, NP  Turmeric 400 MG CAPS Take 400 mg by mouth daily.     [provider]    Family History Family History  Problem Relation Age of Onset   Prostate cancer Father    Diabetes Father     Social History Social History   Tobacco Use   Smoking status: Former  Years: 25.00    Types: Cigarettes    Quit date: 02/16/1978    Years since quitting: 44.0   Smokeless tobacco: Never  Vaping Use   Vaping Use: Never used  Substance Use Topics   Alcohol use: Not Currently   Drug use: No     Allergies   Atorvastatin and Rosuvastatin   Review of Systems Review of Systems   Physical Exam Triage Vital Signs ED Triage Vitals  Enc Vitals Group     BP 03/07/22 1115 136/74     Pulse Rate 03/07/22 1115 90     Resp 03/07/22 1115 15     Temp 03/07/22 1115 98.1 F (36.7 C)     Temp Source 03/07/22 1115 Oral     SpO2 03/07/22 1115 95 %     Weight 03/07/22 1114 205 lb 0.4 oz (93 kg)     Height 03/07/22 1114 6' (1.829 m)     Head Circumference --      Peak Flow --      Pain Score 03/07/22 1131 6     Pain Loc --      Pain Edu? --      Excl. in [place]? --    No data found.  Updated Vital Signs BP 136/74 (BP Location: Left Arm)   Pulse 90   Temp 98.1 F (36.7 C) (Oral)   Resp 15   Ht 6' (1.829 m)   Wt 205 lb 0.4 oz (93 kg)   SpO2 95%   BMI 27.81 kg/m   Visual Acuity Right Eye Distance:   Left Eye Distance:   Bilateral Distance:    Right Eye Near:   Left Eye Near:    Bilateral Near:     Physical Exam Constitutional:      Appearance: Normal appearance.  Eyes:     Extraocular Movements: Extraocular movements intact.  Pulmonary:      Effort: Pulmonary effort is normal.  Skin:    Comments: Defer to photo  Neurological:     Mental Status: He is alert and oriented to person, place, and time. Mental status is at baseline.      UC Treatments / Results  Labs (all labs ordered are listed, but only abnormal results are displayed) Labs Reviewed - No data to display  EKG   Radiology No results found.  Procedures Incision and Drainage  Date/Time: 03/07/2022 2:10 PM  Performed by: Hans Eden, NP Authorized by: Hans Eden, NP   Consent:    Consent obtained:  Verbal   Consent given by:  Patient   Risks, benefits, and alternatives were discussed: yes     Risks discussed:  Incomplete drainage   Alternatives discussed:  No treatment Universal protocol:    Procedure explained and questions answered to patient or proxy's satisfaction: yes     Patient identity confirmed:  Verbally with patient Location:    Type:  Fluid collection   Size:  1x2   Location: hand. Pre-procedure details:    Skin preparation:  Chlorhexidine Sedation:    Sedation type:  None Anesthesia:    Anesthesia method:  Local infiltration   Local anesthetic:  Lidocaine 1% w/o epi Procedure type:    Complexity:  Simple Procedure details:    Incision types:  Stab incision   Drainage:  Bloody and purulent   Drainage amount:  Moderate   Wound treatment:  Wound left open   Packing materials:  None Post-procedure details:    Procedure completion:  Tolerated  well, no immediate complications  (including critical care time)  Medications Ordered in UC Medications - No data to display  Initial Impression / Assessment and Plan / UC Course  I have reviewed the triage vital signs and the nursing notes.  Pertinent labs & imaging results that were available during my care of the patient were reviewed by me and considered in my medical decision making (see chart for details).  Puncture wound of left ear without foreign body, initial  encounter, cat scratch and lifting with infection, initial encounter  Presentation is consistent with infection, discussed with patient, incision and drainage completed, able to expel moderate amount of bloody purulent drainage, patient endorses some tightness has improved, prescribed Augmentin as well as prednisone for management of swelling pain, may additionally use Tylenol, may use ice and heat and elevation for additional supportive measures, given strict precautions that if no improvement seen within 72 hours to follow-up for reevaluation  Final Clinical Impressions(s) / UC Diagnoses   Final diagnoses:  Puncture wound of left hand without foreign body, initial encounter  Cat scratch of left hand with infection, initial encounter     Discharge Instructions      Today you are being treated for infection to your left hand  Begin Augmentin every morning and every evening for 7 days, daily you will begin to see improvement in about 48 hours and steady progression from there  Begin prednisone every morning with food for 5 days to further help reduce swelling and to provide comfort for pain, you may take Tylenol 500 mg every 6 hours in addition to this  You may use ice or heat over the affected area to help reduce swelling and for general comfort  If you do not see any improvement in your symptoms in 3 days please follow-up for reevaluation with urgent care or primary doctor   ED Prescriptions     Medication Sig Dispense Auth. Provider   amoxicillin-clavulanate (AUGMENTIN) 875-125 MG tablet Take 1 tablet by mouth every 12 (twelve) hours. 14 tablet Aziel Morgan R, NP   predniSONE (DELTASONE) 20 MG tablet Take 2 tablets (40 mg total) by mouth daily. 10 tablet Hans Eden, NP      PDMP not reviewed this encounter.   Hans Eden, NP 03/07/22 1151    Hans Eden, [place] 03/07/22 [phone]

## 2022-03-07 NOTE — ED Triage Notes (Signed)
Patient states that a week ago his cat scratched his left hand.  Patient reports redness, swelling and pain in his left hand for 3 days.  Patient denies fevers.

## 2022-03-07 NOTE — Discharge Instructions (Signed)
Today you are being treated for infection to your left hand  Begin Augmentin every morning and every evening for 7 days, daily you will begin to see improvement in about 48 hours and steady progression from there  Begin prednisone every morning with food for 5 days to further help reduce swelling and to provide comfort for pain, you may take Tylenol 500 mg every 6 hours in addition to this  You may use ice or heat over the affected area to help reduce swelling and for general comfort  If you do not see any improvement in your symptoms in 3 days please follow-up for reevaluation with urgent care or primary doctor

## 2023-04-24 ENCOUNTER — Ambulatory Visit
Admission: EM | Admit: 2023-04-24 | Discharge: 2023-04-24 | Disposition: A | Discharge status: Home / Self Care | Attending: Emergency Medicine | Admitting: Emergency Medicine

## 2023-04-24 ENCOUNTER — Encounter: Payer: Self-pay | Admitting: Emergency Medicine

## 2023-04-24 DIAGNOSIS — J0100 Acute maxillary sinusitis, unspecified: Secondary | ICD-10-CM | POA: Diagnosis not present

## 2023-04-24 MED ORDER — BENZONATATE 100 MG PO CAPS: 200.0000 mg | ORAL_CAPSULE | Freq: Three times a day (TID) | ORAL | 0 refills | Status: DC

## 2023-04-24 MED ORDER — PROMETHAZINE-DM 6.25-15 MG/5ML PO SYRP: 5.0000 mL | ORAL_SOLUTION | Freq: Four times a day (QID) | ORAL | 0 refills | Status: AC | PRN

## 2023-04-24 MED ORDER — AMOXICILLIN-POT CLAVULANATE 875-125 MG PO TABS: 1.0000 | ORAL_TABLET | Freq: Two times a day (BID) | ORAL | 0 refills | Status: AC

## 2023-04-24 MED ORDER — IPRATROPIUM BROMIDE 0.06 % NA SOLN: 2.0000 | Freq: Four times a day (QID) | NASAL | 12 refills | Status: DC

## 2023-04-24 NOTE — ED Triage Notes (Signed)
 Patient c/o sinus drainage, cough, and nasal congestion that started 2-3 weeks ago.  Patient denies fevers.

## 2023-04-24 NOTE — Discharge Instructions (Addendum)
 The Augmentin twice daily with food for 10 days for treatment of your sinusitis.  Perform sinus irrigation 2-3 times a day with a [employer] sinus rinse kit and distilled water.  Do not use tap water.  You can use plain over-the-counter Mucinex every 6 hours to break up the stickiness of the mucus so your body can clear it.  Increase your oral fluid intake to thin out your mucus so that is also able for your body to clear more easily.  Take an over-the-counter probiotic, such as Culturelle-align-activia, 1 hour after each dose of antibiotic to prevent diarrhea.  Use the Atrovent nasal spray, 2 squirts in each nostril every 6 hours, as needed for runny nose and postnasal drip.  Use the Tessalon Perles every 8 hours during the day.  Take them with a small sip of water.  They may give you some numbness to the base of your tongue or a metallic taste in your mouth, this is normal.  Use the Promethazine DM cough syrup at bedtime for cough and congestion.  It will make you drowsy so do not take it during the day.  If you develop any shortness of breath or wheezing use your albuterol inhaler as previously prescribed.  Return for reevaluation or see your primary care provider for any new or worsening symptoms.

## 2023-04-24 NOTE — ED Provider Notes (Signed)
 [facility]    CSN: [id number] Arrival date & time: 04/24/23  1345      History   Chief Complaint Chief Complaint  Patient presents with   Sinus Problem   Cough   Nasal Congestion    HPI Zachary Trevino Sr. is a 78 y.o. male.   HPI  78 year old male with past medical history significant for diet-controlled diabetes, GERD, CAD, COPD, arthritis, and anxiety presents for evaluation of 2 to 3 weeks worth of nasal congestion with sinus pressure and milky green nasal drainage, cough that is productive for the same milky green sputum, occasional nosebleeds, and wheezing.  He denies any fever or shortness of breath.  He does use Advair twice daily but has not had to use his albuterol rescue inhaler during the course of this illness.  Past Medical History:  Diagnosis Date   Anxiety    Arthritis    COPD (chronic obstructive pulmonary disease) (HCC)    Coronary artery disease    Diabetes mellitus without complication (HCC)    diet controlled   GERD (gastroesophageal reflux disease)    Hemorrhoids    History of colon polyps    Hx of seasonal allergies    Hyperlipidemia    Hypertension    MRSA infection    a sore on face   Myocardial infarction [facility]) 2010   Pneumonia     Patient Active Problem List   Diagnosis Date Noted   S/P cervical spinal fusion 09/27/2019    Past Surgical History:  Procedure Laterality Date   ANTERIOR CERVICAL DECOMP/DISCECTOMY FUSION N/A 09/27/2019   Procedure: ANTERIOR CERVICAL DECOMPRESSION/DISCECTOMY FUSION 3 LEVELS C4-7;  Surgeon: Venetia Night, MD;  Location: [facility] ORS;  Service: Neurosurgery;  Laterality: N/A;   CERVICAL SPINE SURGERY     COLONOSCOPY WITH ESOPHAGOGASTRODUODENOSCOPY (EGD)     COLONOSCOPY WITH PROPOFOL N/A 09/06/2017   Procedure: COLONOSCOPY WITH PROPOFOL;  Surgeon: Christena Deem, MD;  Location: [facility] ENDOSCOPY;  Service: Endoscopy;  Laterality: N/A;   COLONOSCOPY WITH PROPOFOL N/A 12/08/2021   Procedure:  COLONOSCOPY WITH PROPOFOL;  Surgeon: Regis Bill, MD;  Location: [facility] ENDOSCOPY;  Service: Endoscopy;  Laterality: N/A;   CORONARY ARTERY BYPASS GRAFT  2010   3 vessel   EYE SURGERY     HEMORRHOID SURGERY  20 years ago   twice   NASAL SEPTOPLASTY W/ TURBINOPLASTY Bilateral 11/04/2017   Procedure: NASAL SEPTOPLASTY WITH INFERIOR TURBINATE REDUCTION;  Surgeon: Vernie Murders, MD;  Location: [facility];  Service: ENT;  Laterality: Bilateral;   RETINAL DETACHMENT SURGERY Left 08/2019   RETINAL TEAR REPAIR CRYOTHERAPY Right    VEIN BYPASS SURGERY  04/2008   3 vessel with vein graft and stent placement       Home Medications    Prior to Admission medications   Medication Sig Start Date End Date Taking? Authorizing Provider  amoxicillin-clavulanate (AUGMENTIN) 875-125 MG tablet Take 1 tablet by mouth every 12 (twelve) hours for 10 days. 04/24/23 05/04/23 Yes Becky Augusta, NP  benzonatate (TESSALON) 100 MG capsule Take 2 capsules (200 mg total) by mouth every 8 (eight) hours. 04/24/23  Yes Becky Augusta, NP  ipratropium (ATROVENT) 0.06 % nasal spray Place 2 sprays into both nostrils 4 (four) times daily. 04/24/23  Yes Becky Augusta, NP  promethazine-dextromethorphan (PROMETHAZINE-DM) 6.25-15 MG/5ML syrup Take 5 mLs by mouth 4 (four) times daily as needed. 04/24/23  Yes Becky Augusta, NP  acetaminophen (TYLENOL) 500 MG tablet Take 2 tablets (1,000 mg total) by  mouth every 6 (six) hours. 09/28/19   Patsey Berthold, NP  albuterol (PROVENTIL HFA;VENTOLIN HFA) 108 (90 Base) MCG/ACT inhaler Inhale 2 puffs into the lungs every 4 (four) hours as needed for wheezing or shortness of breath.    [provider]  Alpha-Lipoic Acid 200 MG CAPS Take 200 mg by mouth daily.     [provider]  alum & mag hydroxide-simeth (MAALOX/MYLANTA) 200-200-20 MG/5ML suspension Take 30 mLs by mouth every 6 (six) hours as needed for indigestion. 09/28/19   Patsey Berthold, NP  Ascorbic Acid (VITAMIN C)  1000 MG tablet Take 1,000 mg by mouth daily.    [provider]  aspirin EC 81 MG tablet Take 81 mg by mouth daily. Swallow whole.    [provider]  bisacodyl (DULCOLAX) 5 MG EC tablet Take 1 tablet (5 mg total) by mouth daily as needed for moderate constipation. 09/28/19   Patsey Berthold, NP  Cholecalciferol (VITAMIN D3 PO) Take 2 tablets by mouth daily.    [provider]  Cinnamon 500 MG TABS Take 500 mg by mouth daily.     [provider]  COENZYME Q-10 PO Take 1 capsule by mouth daily.    [provider]  docusate sodium (COLACE) 100 MG capsule Take 100 mg by mouth daily.    [provider]  escitalopram (LEXAPRO) 10 MG tablet Take 10 mg by mouth at bedtime.     [provider]  Fluticasone-Salmeterol (ADVAIR) 100-50 MCG/DOSE AEPB Inhale 1 puff into the lungs 2 (two) times daily.    [provider]  gabapentin (NEURONTIN) 300 MG capsule Take 2 capsules (600 mg total) by mouth 3 (three) times daily. 01/22/22 01/22/23  Venetia Night, MD  HYDROcodone-acetaminophen (NORCO) 5-325 MG tablet Take 1 tablet by mouth every 6 (six) hours as needed for moderate pain. 01/03/22   Evon Slack, PA-C  ipratropium-albuterol (DUONEB) 0.5-2.5 (3) MG/3ML SOLN Take 3 mLs by nebulization every 6 (six) hours as needed (shortness of breath).     [provider]  losartan (COZAAR) 100 MG tablet Take 50 mg by mouth daily.    [provider]  lovastatin (MEVACOR) 40 MG tablet Take 40 mg by mouth at bedtime.    [provider]  menthol-cetylpyridinium (CEPACOL) 3 MG lozenge Take 1 lozenge (3 mg total) by mouth as needed for sore throat (sore throat). 09/28/19   Patsey Berthold, NP  Multiple Vitamin (MULTIVITAMIN) tablet Take 1 tablet by mouth daily.    [provider]  omeprazole (PRILOSEC) 40 MG capsule Take 40 mg by mouth daily.  02/17/13   [provider]  oxyCODONE (ROXICODONE) 5 MG immediate  release tablet Take 1 tablet (5 mg total) by mouth every 4 (four) hours as needed for moderate pain or severe pain (5mg  for moderate pain (3-6/10), 10mg  for severe pain (7-10/10)). 09/28/19   Patsey Berthold, NP  phenol (CHLORASEPTIC) 1.4 % LIQD Use as directed 1 spray in the mouth or throat as needed for throat irritation / pain. 09/28/19   Patsey Berthold, NP  polyethylene glycol (MIRALAX / GLYCOLAX) 17 g packet Take 17 g by mouth daily.    [provider]  senna (SENOKOT) 8.6 MG TABS tablet Take 2 tablets (17.2 mg total) by mouth 2 (two) times daily. 09/28/19   Patsey Berthold, NP  Turmeric 400 MG CAPS Take 400 mg by mouth daily.     [provider]    Family History Family History  Problem Relation Age  of Onset   Prostate cancer Father    Diabetes Father     Social History Social History   Tobacco Use   Smoking status: Former    Current packs/day: 0.00    Types: Cigarettes    Start date: 02/16/1953    Quit date: 02/16/1978    Years since quitting: 45.2   Smokeless tobacco: Never  Vaping Use   Vaping status: Never Used  Substance Use Topics   Alcohol use: Not Currently   Drug use: No     Allergies   Atorvastatin and Rosuvastatin   Review of Systems Review of Systems  Constitutional:  Negative for fever.  HENT:  Positive for congestion, nosebleeds, rhinorrhea and sinus pressure. Negative for ear pain.   Respiratory:  Positive for cough and wheezing. Negative for shortness of breath.      Physical Exam Triage Vital Signs ED Triage Vitals [04/24/23 1356]  Encounter Vitals Group     BP      Systolic BP Percentile      Diastolic BP Percentile      Pulse      Resp      Temp      Temp src      SpO2      Weight 205 lb 0.4 oz (93 kg)     Height 6' (1.829 m)     Head Circumference      Peak Flow      Pain Score 0     Pain Loc      Pain Education      Exclude from Growth Chart    No data found.  Updated Vital Signs BP (!) 167/80 (BP Location:  Right Arm)   Pulse 78   Temp 98.2 F (36.8 C) (Oral)   Resp 15   Ht 6' (1.829 m)   Wt 205 lb 0.4 oz (93 kg)   SpO2 95%   BMI 27.81 kg/m   Visual Acuity Right Eye Distance:   Left Eye Distance:   Bilateral Distance:    Right Eye Near:   Left Eye Near:    Bilateral Near:     Physical Exam Vitals and nursing note reviewed.  Constitutional:      Appearance: Normal appearance. He is not ill-appearing.  HENT:     Head: Normocephalic and atraumatic.     Nose: Congestion and rhinorrhea present.     Comments: Nasal mucosa is erythematous and edematous with a milky green discharge in both nares.  Bilateral maxillary sinuses are tender to compression.    Mouth/Throat:     Mouth: Mucous membranes are moist.     Pharynx: Oropharynx is clear. No oropharyngeal exudate or posterior oropharyngeal erythema.  Cardiovascular:     Rate and Rhythm: Normal rate and regular rhythm.     Pulses: Normal pulses.     Heart sounds: Normal heart sounds. No murmur heard.    No friction rub. No gallop.  Pulmonary:     Effort: Pulmonary effort is normal.     Breath sounds: Normal breath sounds. No wheezing, rhonchi or rales.  Musculoskeletal:     Cervical back: Normal range of motion and neck supple. No tenderness.  Lymphadenopathy:     Cervical: No cervical adenopathy.  Skin:    General: Skin is warm and dry.     Capillary Refill: Capillary refill takes less than 2 seconds.     Findings: No rash.  Neurological:     General: No focal deficit present.  Mental Status: He is alert and oriented to person, place, and time.      UC Treatments / Results  Labs (all labs ordered are listed, but only abnormal results are displayed) Labs Reviewed - No data to display  EKG   Radiology No results found.  Procedures Procedures (including critical care time)  Medications Ordered in UC Medications - No data to display  Initial Impression / Assessment and Plan / UC Course  I have reviewed the  triage vital signs and the nursing notes.  Pertinent labs & imaging results that were available during my care of the patient were reviewed by me and considered in my medical decision making (see chart for details).   Patient is a nontoxic-appearing 38 old male presenting for evaluation of 2 to 3 weeks worth of sinus symptoms as outlined HPI above.  His physical exam does reveal inflammation of his upper respiratory tract as evidenced by inflamed nasal mucosa with milky green discharge in both nasal passages as well as tender to compression of bilateral maxillary sinuses.  Oropharyngeal exam is benign.  No cervical adenopathy appreciable exam.  Cardiopulmonary exam reveals clear lung sounds in all fields.  I will discharge patient with a diagnosis of acute maxillary sinusitis and treated with a 10-day course of Augmentin 875 mg twice daily.  We discussed using sinus irrigation to alleviate his mucus burden.  Additionally, I will prescribe Atrovent nasal spray to help with the nasal congestion and Tessalon Perles and Promethazine DM cough syrup for cough and congestion.  If he develops any shortness breath or wheezing he should use his rescue inhaler.  Return precautions reviewed.   Final Clinical Impressions(s) / UC Diagnoses   Final diagnoses:  Acute non-recurrent maxillary sinusitis     Discharge Instructions      The Augmentin twice daily with food for 10 days for treatment of your sinusitis.  Perform sinus irrigation 2-3 times a day with a [employer] sinus rinse kit and distilled water.  Do not use tap water.  You can use plain over-the-counter Mucinex every 6 hours to break up the stickiness of the mucus so your body can clear it.  Increase your oral fluid intake to thin out your mucus so that is also able for your body to clear more easily.  Take an over-the-counter probiotic, such as Culturelle-align-activia, 1 hour after each dose of antibiotic to prevent diarrhea.  Use the Atrovent  nasal spray, 2 squirts in each nostril every 6 hours, as needed for runny nose and postnasal drip.  Use the Tessalon Perles every 8 hours during the day.  Take them with a small sip of water.  They may give you some numbness to the base of your tongue or a metallic taste in your mouth, this is normal.  Use the Promethazine DM cough syrup at bedtime for cough and congestion.  It will make you drowsy so do not take it during the day.  If you develop any shortness of breath or wheezing use your albuterol inhaler as previously prescribed.  Return for reevaluation or see your primary care provider for any new or worsening symptoms.      ED Prescriptions     Medication Sig Dispense Auth. Provider   amoxicillin-clavulanate (AUGMENTIN) 875-125 MG tablet Take 1 tablet by mouth every 12 (twelve) hours for 10 days. 20 tablet Becky Augusta, NP   benzonatate (TESSALON) 100 MG capsule Take 2 capsules (200 mg total) by mouth every 8 (eight) hours. 21 capsule Alycia Rossetti,  Riki Rusk, NP   ipratropium (ATROVENT) 0.06 % nasal spray Place 2 sprays into both nostrils 4 (four) times daily. 15 mL Becky Augusta, NP   promethazine-dextromethorphan (PROMETHAZINE-DM) 6.25-15 MG/5ML syrup Take 5 mLs by mouth 4 (four) times daily as needed. 118 mL Becky Augusta, NP      PDMP not reviewed this encounter.   Becky Augusta, NP 04/24/23 1406

## 2023-09-27 ENCOUNTER — Encounter: Payer: Self-pay | Admitting: Emergency Medicine

## 2023-09-27 ENCOUNTER — Ambulatory Visit: Admission: EM | Admit: 2023-09-27 | Discharge: 2023-09-27 | Disposition: A | Discharge status: Home / Self Care

## 2023-09-27 DIAGNOSIS — R051 Acute cough: Secondary | ICD-10-CM | POA: Diagnosis not present

## 2023-09-27 DIAGNOSIS — J329 Chronic sinusitis, unspecified: Secondary | ICD-10-CM

## 2023-09-27 MED ORDER — DOXYCYCLINE HYCLATE 100 MG PO CAPS: 100.0000 mg | ORAL_CAPSULE | Freq: Two times a day (BID) | ORAL | 0 refills | Status: AC

## 2023-09-27 NOTE — ED Provider Notes (Signed)
 [facility]    CSN: [id number] Arrival date & time: 09/27/23  1137      History   Chief Complaint Chief Complaint  Patient presents with   Cough   Nasal Congestion    HPI Zachary FERG Sr. is a 78 y.o. male.   78 year old male patient, Zachary Trevino, Sr., presents to urgent care for evaluation of cough and nasal congestion for a week.  Patient states he has history of recurrent sinusitis and usually sees Dr. Juengal at [facility] but was unable to get an appointment.  Patient states he has had green nasal drainage with occasional cough and congestion for a week or more.  Patient states he took 3 days worth of amoxicillin  that was his wife's that was leftover from a dental appointment, patient states he had improvement with antibiotics but has since gotten worse after he ran out of meds.  The history is provided by the patient. No language interpreter was used.    Past Medical History:  Diagnosis Date   Anxiety    Arthritis    COPD (chronic obstructive pulmonary disease) (HCC)    Coronary artery disease    Diabetes mellitus without complication (HCC)    diet controlled   GERD (gastroesophageal reflux disease)    Hemorrhoids    History of colon polyps    Hx of seasonal allergies    Hyperlipidemia    Hypertension    MRSA infection    a sore on face   Myocardial infarction (HCC) 2010   Pneumonia     Patient Active Problem List   Diagnosis Date Noted   Recurrent sinusitis 09/27/2023   Acute cough 09/27/2023   S/P cervical spinal fusion 09/27/2019    Past Surgical History:  Procedure Laterality Date   ANTERIOR CERVICAL DECOMP/DISCECTOMY FUSION N/A 09/27/2019   Procedure: ANTERIOR CERVICAL DECOMPRESSION/DISCECTOMY FUSION 3 LEVELS C4-7;  Surgeon: Clois Fret, MD;  Location: [facility] ORS;  Service: Neurosurgery;  Laterality: N/A;   CERVICAL SPINE SURGERY     COLONOSCOPY WITH ESOPHAGOGASTRODUODENOSCOPY (EGD)     COLONOSCOPY WITH PROPOFOL   N/A 09/06/2017   Procedure: COLONOSCOPY WITH PROPOFOL ;  Surgeon: Gaylyn Gladis PENNER, MD;  Location: [facility] ENDOSCOPY;  Service: Endoscopy;  Laterality: N/A;   COLONOSCOPY WITH PROPOFOL  N/A 12/08/2021   Procedure: COLONOSCOPY WITH PROPOFOL ;  Surgeon: Maryruth Ole DASEN, MD;  Location: [facility] ENDOSCOPY;  Service: Endoscopy;  Laterality: N/A;   CORONARY ARTERY BYPASS GRAFT  2010   3 vessel   EYE SURGERY     HEMORRHOID SURGERY  20 years ago   twice   NASAL SEPTOPLASTY W/ TURBINOPLASTY Bilateral 11/04/2017   Procedure: NASAL SEPTOPLASTY WITH INFERIOR TURBINATE REDUCTION;  Surgeon: Edda Mt, MD;  Location: [facility];  Service: ENT;  Laterality: Bilateral;   RETINAL DETACHMENT SURGERY Left 08/2019   RETINAL TEAR REPAIR CRYOTHERAPY Right    VEIN BYPASS SURGERY  04/2008   3 vessel with vein graft and stent placement       Home Medications    Prior to Admission medications   Medication Sig Start Date End Date Taking? Authorizing Provider  doxycycline  (VIBRAMYCIN ) 100 MG capsule Take 1 capsule (100 mg total) by mouth 2 (two) times daily for 7 days. 09/27/23 10/04/23 Yes Horton Ellithorpe, NP  ezetimibe (ZETIA) 10 MG tablet Take 10 mg by mouth daily. 08/02/23 08/01/24 Yes [provider]  acetaminophen  (TYLENOL ) 500 MG tablet Take 2 tablets (1,000 mg total) by mouth every 6 (six) hours. 09/28/19   Zdeb, Christine,  NP  albuterol  (PROVENTIL  HFA;VENTOLIN  HFA) 108 (90 Base) MCG/ACT inhaler Inhale 2 puffs into the lungs every 4 (four) hours as needed for wheezing or shortness of breath.    [provider]  Alpha-Lipoic Acid 200 MG CAPS Take 200 mg by mouth daily.     [provider]  alum & mag hydroxide-simeth (MAALOX/MYLANTA) 200-200-20 MG/5ML suspension Take 30 mLs by mouth every 6 (six) hours as needed for indigestion. 09/28/19   Zdeb, Christine, NP  Ascorbic Acid (VITAMIN C) 1000 MG tablet Take 1,000 mg by mouth daily.    [provider]  aspirin EC 81 MG  tablet Take 81 mg by mouth daily. Swallow whole.    [provider]  benzonatate  (TESSALON ) 100 MG capsule Take 2 capsules (200 mg total) by mouth every 8 (eight) hours. 04/24/23   Bernardino Ditch, NP  bisacodyl  (DULCOLAX) 5 MG EC tablet Take 1 tablet (5 mg total) by mouth daily as needed for moderate constipation. 09/28/19   Zdeb, Christine, NP  Cholecalciferol (VITAMIN D3 PO) Take 2 tablets by mouth daily.    [provider]  Cinnamon 500 MG TABS Take 500 mg by mouth daily.     [provider]  COENZYME Q-10 PO Take 1 capsule by mouth daily.    [provider]  docusate sodium  (COLACE) 100 MG capsule Take 100 mg by mouth daily.    [provider]  escitalopram  (LEXAPRO ) 10 MG tablet Take 10 mg by mouth at bedtime.     [provider]  Fluticasone-Salmeterol (ADVAIR) 100-50 MCG/DOSE AEPB Inhale 1 puff into the lungs 2 (two) times daily.    [provider]  gabapentin  (NEURONTIN ) 300 MG capsule Take 2 capsules (600 mg total) by mouth 3 (three) times daily. 01/22/22 01/22/23  Clois Fret, MD  HYDROcodone -acetaminophen  (NORCO) 5-325 MG tablet Take 1 tablet by mouth every 6 (six) hours as needed for moderate pain. 01/03/22   Charlene Debby BROCKS, PA-C  ipratropium (ATROVENT ) 0.06 % nasal spray Place 2 sprays into both nostrils 4 (four) times daily. 04/24/23   Bernardino Ditch, NP  ipratropium-albuterol  (DUONEB) 0.5-2.5 (3) MG/3ML SOLN Take 3 mLs by nebulization every 6 (six) hours as needed (shortness of breath).     [provider]  losartan  (COZAAR ) 100 MG tablet Take 50 mg by mouth daily.    [provider]  lovastatin (MEVACOR) 40 MG tablet Take 40 mg by mouth at bedtime.    [provider]  menthol -cetylpyridinium (CEPACOL) 3 MG lozenge Take 1 lozenge (3 mg total) by mouth as needed for sore throat (sore throat). 09/28/19   Zdeb, Christine, NP  Multiple Vitamin (MULTIVITAMIN) tablet Take 1 tablet by mouth daily.     [provider]  omeprazole (PRILOSEC) 40 MG capsule Take 40 mg by mouth daily.  02/17/13   [provider]  oxyCODONE  (ROXICODONE ) 5 MG immediate release tablet Take 1 tablet (5 mg total) by mouth every 4 (four) hours as needed for moderate pain or severe pain (5mg  for moderate pain (3-6/10), 10mg  for severe pain (7-10/10)). 09/28/19   Zdeb, Christine, NP  phenol (CHLORASEPTIC) 1.4 % LIQD Use as directed 1 spray in the mouth or throat as needed for throat irritation / pain. 09/28/19   Zdeb, Christine, NP  polyethylene glycol (MIRALAX  / GLYCOLAX ) 17 g packet Take 17 g by mouth daily.    [provider]  promethazine -dextromethorphan (PROMETHAZINE -DM) 6.25-15 MG/5ML syrup Take 5 mLs by mouth 4 (four) times daily as needed.  04/24/23   Bernardino Ditch, NP  senna (SENOKOT) 8.6 MG TABS tablet Take 2 tablets (17.2 mg total) by mouth 2 (two) times daily. 09/28/19   Zdeb, Christine, NP  Turmeric 400 MG CAPS Take 400 mg by mouth daily.     [provider]    Family History Family History  Problem Relation Age of Onset   Prostate cancer Father    Diabetes Father     Social History Social History   Tobacco Use   Smoking status: Former    Current packs/day: 0.00    Types: Cigarettes    Start date: 02/16/1953    Quit date: 02/16/1978    Years since quitting: 45.6   Smokeless tobacco: Never  Vaping Use   Vaping status: Never Used  Substance Use Topics   Alcohol use: Not Currently   Drug use: No     Allergies   Evolocumab, Atorvastatin, and Rosuvastatin   Review of Systems Review of Systems  HENT:  Positive for congestion, postnasal drip, sinus pressure and sinus pain.   Respiratory:  Positive for cough.   All other systems reviewed and are negative.    Physical Exam Triage Vital Signs ED Triage Vitals  Encounter Vitals Group     BP 09/27/23 1202 114/60     Girls Systolic BP Percentile --      Girls Diastolic BP Percentile --      Boys Systolic BP  Percentile --      Boys Diastolic BP Percentile --      Pulse Rate 09/27/23 1202 71     Resp 09/27/23 1202 16     Temp 09/27/23 1202 98.1 F (36.7 C)     Temp Source 09/27/23 1202 Oral     SpO2 09/27/23 1202 96 %     Weight --      Height --      Head Circumference --      Peak Flow --      Pain Score 09/27/23 1201 0     Pain Loc --      Pain Education --      Exclude from Growth Chart --    No data found.  Updated Vital Signs BP 114/60 (BP Location: Left Arm)   Pulse 71   Temp 98.1 F (36.7 C) (Oral)   Resp 16   SpO2 96%   Visual Acuity Right Eye Distance:   Left Eye Distance:   Bilateral Distance:    Right Eye Near:   Left Eye Near:    Bilateral Near:     Physical Exam Vitals and nursing note reviewed.  Constitutional:      Appearance: He is well-developed and well-groomed.  HENT:     Head: Normocephalic.     Right Ear: Tympanic membrane is retracted.     Left Ear: Tympanic membrane is retracted.     Nose: Mucosal edema and congestion present.     Right Sinus: Maxillary sinus tenderness present.     Left Sinus: Maxillary sinus tenderness present.     Mouth/Throat:     Lips: Pink.     Mouth: Mucous membranes are moist.     Pharynx: Postnasal drip present.  Cardiovascular:     Rate and Rhythm: Normal rate and regular rhythm.     Heart sounds: Normal heart sounds.  Pulmonary:     Effort: Pulmonary effort is normal.     Breath sounds: Normal breath sounds and air entry.  Neurological:  General: No focal deficit present.     Mental Status: He is alert and oriented to person, place, and time.     GCS: GCS eye subscore is 4. GCS verbal subscore is 5. GCS motor subscore is 6.  Psychiatric:        Behavior: Behavior is cooperative.      UC Treatments / Results  Labs (all labs ordered are listed, but only abnormal results are displayed) Labs Reviewed - No data to display  EKG   Radiology No results found.  Procedures Procedures (including  critical care time)  Medications Ordered in UC Medications - No data to display  Initial Impression / Assessment and Plan / UC Course  I have reviewed the triage vital signs and the nursing notes.  Pertinent labs & imaging results that were available during my care of the patient were reviewed by me and considered in my medical decision making (see chart for details).  Clinical Course as of 09/27/23 2047  Mon Sep 27, 2023  1225 Discussed plan of care with patient, patient offered Augmentin  or doxycycline , patient states he has taken doxycycline  in past and has worked well, patient also has Atrovent  nasal spray he will use, patient will follow-up with [facility] for recurrent sinusitis. [JD]    Clinical Course User Index [JD] Jayle Solarz, Rilla, NP    Ddx: Recurrent sinusitis, acute cough, viral illness,allergies Final Clinical Impressions(s) / UC Diagnoses   Final diagnoses:  Recurrent sinusitis  Acute cough     Discharge Instructions      Take doxycycline  as prescribed until completed Refrain from saving any antibiotics or taking other persons prescriptions as you could have a reaction to them or build up resistance if you do not take the antibiotics as prescribed Push fluids, avoid caffeine, use your Atrovent  nasal spray as previously recommended, follow-up with  ear nose and throat regarding recurrent sinus problems. Return as needed    ED Prescriptions     Medication Sig Dispense Auth. Provider   doxycycline  (VIBRAMYCIN ) 100 MG capsule Take 1 capsule (100 mg total) by mouth 2 (two) times daily for 7 days. 14 capsule Mya Suell, NP      PDMP not reviewed this encounter.   Aminta Rilla, NP 09/27/23 2048

## 2023-09-27 NOTE — Discharge Instructions (Addendum)
 Take doxycycline  as prescribed until completed Refrain from saving any antibiotics or taking other persons prescriptions as you could have a reaction to them or build up resistance if you do not take the antibiotics as prescribed Push fluids, avoid caffeine, use your Atrovent  nasal spray as previously recommended, follow-up with [facility] regarding recurrent sinus problems. Return as needed

## 2023-09-27 NOTE — ED Triage Notes (Addendum)
 Pt presents with green nasal drainage, chest congestion and cough x 1 week. Pt has taken  mucinex and left over antibiotics for his symptoms.

## 2023-11-09 ENCOUNTER — Other Ambulatory Visit: Payer: Self-pay

## 2023-11-09 ENCOUNTER — Emergency Department
Admission: EM | Admit: 2023-11-09 | Discharge: 2023-11-09 | Disposition: A | Discharge status: Home / Self Care | Attending: Emergency Medicine | Admitting: Emergency Medicine

## 2023-11-09 ENCOUNTER — Emergency Department

## 2023-11-09 DIAGNOSIS — W5501XA Bitten by cat, initial encounter: Secondary | ICD-10-CM | POA: Diagnosis not present

## 2023-11-09 DIAGNOSIS — I2510 Atherosclerotic heart disease of native coronary artery without angina pectoris: Secondary | ICD-10-CM | POA: Diagnosis not present

## 2023-11-09 DIAGNOSIS — E119 Type 2 diabetes mellitus without complications: Secondary | ICD-10-CM | POA: Insufficient documentation

## 2023-11-09 DIAGNOSIS — S6992XA Unspecified injury of left wrist, hand and finger(s), initial encounter: Secondary | ICD-10-CM | POA: Diagnosis present

## 2023-11-09 DIAGNOSIS — L03114 Cellulitis of left upper limb: Secondary | ICD-10-CM | POA: Insufficient documentation

## 2023-11-09 DIAGNOSIS — J449 Chronic obstructive pulmonary disease, unspecified: Secondary | ICD-10-CM | POA: Insufficient documentation

## 2023-11-09 DIAGNOSIS — S61552A Open bite of left wrist, initial encounter: Secondary | ICD-10-CM | POA: Insufficient documentation

## 2023-11-09 DIAGNOSIS — I10 Essential (primary) hypertension: Secondary | ICD-10-CM | POA: Diagnosis not present

## 2023-11-09 LAB — COMPREHENSIVE METABOLIC PANEL WITH GFR
ALT: 13 U/L (ref 0–44)
AST: 18 U/L (ref 15–41)
Albumin: 3.8 g/dL (ref 3.5–5.0)
Alkaline Phosphatase: 63 U/L (ref 38–126)
Anion gap: 11 (ref 5–15)
BUN: 25 mg/dL — ABNORMAL HIGH (ref 8–23)
CO2: 22 mmol/L (ref 22–32)
Calcium: 8.5 mg/dL — ABNORMAL LOW (ref 8.9–10.3)
Chloride: 99 mmol/L (ref 98–111)
Creatinine, Ser: 1.21 mg/dL (ref 0.61–1.24)
GFR, Estimated: >60 mL/min (ref >60)
Glucose, Bld: 173 mg/dL — ABNORMAL HIGH (ref 70–99)
Potassium: 4 mmol/L (ref 3.5–5.1)
Sodium: 132 mmol/L — ABNORMAL LOW (ref 135–145)
Total Bilirubin: 1 mg/dL (ref 0.0–1.2)
Total Protein: 6.9 g/dL (ref 6.5–8.1)

## 2023-11-09 LAB — RESP PANEL BY RT-PCR (RSV, FLU A&B, COVID)  RVPGX2
Influenza A by PCR: NEGATIVE
Influenza B by PCR: NEGATIVE
Resp Syncytial Virus by PCR: NEGATIVE
SARS Coronavirus 2 by RT PCR: NEGATIVE

## 2023-11-09 LAB — CBC WITH DIFFERENTIAL/PLATELET
Abs Immature Granulocytes: 0.11 K/uL — ABNORMAL HIGH (ref 0.00–0.07)
Basophils Absolute: 0.1 K/uL (ref 0.0–0.1)
Basophils Relative: 0 %
Eosinophils Absolute: 0.1 K/uL (ref 0.0–0.5)
Eosinophils Relative: 0 %
HCT: 35.7 % — ABNORMAL LOW (ref 39.0–52.0)
Hemoglobin: 12.4 g/dL — ABNORMAL LOW (ref 13.0–17.0)
Immature Granulocytes: 1 %
Lymphocytes Relative: 6 %
Lymphs Abs: 1.1 K/uL (ref 0.7–4.0)
MCH: 32.5 pg (ref 26.0–34.0)
MCHC: 34.7 g/dL (ref 30.0–36.0)
MCV: 93.7 fL (ref 80.0–100.0)
Monocytes Absolute: 1.8 K/uL — ABNORMAL HIGH (ref 0.1–1.0)
Monocytes Relative: 10 %
Neutro Abs: 14.7 K/uL — ABNORMAL HIGH (ref 1.7–7.7)
Neutrophils Relative %: 83 %
Platelets: 204 K/uL (ref 150–400)
RBC: 3.81 MIL/uL — ABNORMAL LOW (ref 4.22–5.81)
RDW: 12.6 % (ref 11.5–15.5)
WBC: 17.8 K/uL — ABNORMAL HIGH (ref 4.0–10.5)
nRBC: 0 % (ref 0.0–0.2)

## 2023-11-09 LAB — URINALYSIS, W/ REFLEX TO CULTURE (INFECTION SUSPECTED)
Bacteria, UA: NONE SEEN
Bilirubin Urine: NEGATIVE
Glucose, UA: NEGATIVE mg/dL
Hgb urine dipstick: NEGATIVE
Ketones, ur: NEGATIVE mg/dL
Leukocytes,Ua: NEGATIVE
Nitrite: NEGATIVE
Protein, ur: NEGATIVE mg/dL
Specific Gravity, Urine: 1.009 (ref 1.005–1.030)
Squamous Epithelial / HPF: 0 /HPF (ref 0–5)
pH: 7 (ref 5.0–8.0)

## 2023-11-09 LAB — PROTIME-INR
INR: 1.1 (ref 0.8–1.2)
Prothrombin Time: 14.6 s (ref 11.4–15.2)

## 2023-11-09 LAB — LACTIC ACID, PLASMA: Lactic Acid, Venous: 0.8 mmol/L (ref 0.5–1.9)

## 2023-11-09 LAB — TROPONIN I (HIGH SENSITIVITY)
Troponin I (High Sensitivity): 6 ng/L (ref <18)
Troponin I (High Sensitivity): 8 ng/L (ref <18)

## 2023-11-09 MED ORDER — OXYCODONE-ACETAMINOPHEN 5-325 MG PO TABS: 1.0000 | ORAL_TABLET | ORAL | 0 refills | Status: DC | PRN

## 2023-11-09 MED ORDER — SODIUM CHLORIDE 0.9 % IV BOLUS: 1000.0000 mL | Freq: Once | INTRAVENOUS | Status: DC

## 2023-11-09 MED ORDER — AMOXICILLIN-POT CLAVULANATE 875-125 MG PO TABS
1.0000 | ORAL_TABLET | Freq: Once | ORAL | Status: AC
  Administered 2023-11-09: 1 via ORAL
  Filled 2023-11-09: qty 1

## 2023-11-09 MED ORDER — ONDANSETRON 4 MG PO TBDP: 4.0000 mg | ORAL_TABLET | Freq: Three times a day (TID) | ORAL | 0 refills | Status: AC | PRN

## 2023-11-09 MED ORDER — AMOXICILLIN-POT CLAVULANATE 875-125 MG PO TABS: 1.0000 | ORAL_TABLET | Freq: Two times a day (BID) | ORAL | 0 refills | Status: AC

## 2023-11-09 MED ORDER — SODIUM CHLORIDE 0.9 % IV BOLUS
500.0000 mL | Freq: Once | INTRAVENOUS | Status: AC
  Administered 2023-11-09: 500 mL via INTRAVENOUS

## 2023-11-09 MED ORDER — MORPHINE SULFATE (PF) 4 MG/ML IV SOLN
6.0000 mg | Freq: Once | INTRAVENOUS | Status: AC
  Administered 2023-11-09: 6 mg via INTRAVENOUS
  Filled 2023-11-09: qty 2

## 2023-11-09 MED ORDER — MORPHINE SULFATE (PF) 4 MG/ML IV SOLN: 4.0000 mg | Freq: Once | INTRAVENOUS | Status: DC

## 2023-11-09 MED ORDER — MORPHINE SULFATE (PF) 4 MG/ML IV SOLN
4.0000 mg | Freq: Once | INTRAVENOUS | Status: AC
  Administered 2023-11-09: 4 mg via INTRAVENOUS
  Filled 2023-11-09: qty 1

## 2023-11-09 NOTE — Discharge Instructions (Addendum)
 You were seen in the emergency department for an infection to your left arm from a cat bite.  You were started on antibiotics and it is important that you take as prescribed.  Call your primary care physician and see if you can be evaluated in the next 1 to 2 days to make sure that your symptoms are improving and the antibiotics are treating your infection.  If you have fever, worsening symptoms or worsening redness and swelling to your left arm it is important that you return immediately to the emergency department.  You can take Tylenol  as needed for pain control.  If you still having severe pain you were given a prescription for Percocet.  You were given a prescription for narcotic pain medications.  Take only if in severe pain.  These are very addictive medications.  These medications can make you constipated.  If you need to take more than 1-2 doses, start a stool softner.  If you become constipated, take 1 capfull of MiraLAX , can repeat untill having regular bowel movements.  Keep this medication out of reach of any children. You cannot drive/work while taking this medication.

## 2023-11-09 NOTE — ED Triage Notes (Signed)
 Pt to ED via POV from home. Pt reports has been having cough, congestion and all over body aches. Pt reports also last Thursday pt was bit in left hand by his own cat. Cat up to date on shots. Pt has swelling to left hand, wrist and forearm. Pt has puncture wounds to top of wrist.

## 2023-11-09 NOTE — ED Notes (Signed)
Pt ambulatory to restroom. Urine sample collected.

## 2023-11-09 NOTE — ED Notes (Signed)
 Pt calling wife for a ride at this time

## 2023-11-09 NOTE — ED Notes (Signed)
Pt verbalizes understanding of discharge instructions. Opportunity for questioning and answers were provided. Pt discharged from ED to home with wife.    

## 2023-11-09 NOTE — ED Provider Notes (Signed)
 [facility] Provider Note    Event Date/Time   First MD Initiated Contact with Patient 11/09/23 [phone]     (approximate)   History   Nasal Congestion (/) and Animal Bite   HPI  Zachary Trevino. is a 78 y.o. male past medical history significant for COPD, CAD, diabetes, GERD, hypertension, hyperlipidemia, presents to the emergency department with congestion and not feeling well.  States that he has not been feeling well over the past couple of days with runny nose, congestion and symptoms that he states is consistent with his normal sinus infection.  States that he gets a sinus infection multiple times a year.  Was followed by ENT but has not seen the new physician.  Last was on antibiotics approximately a month and a half ago from urgent care.  Patient also complains of left arm pain and swelling.  States that he was bit by his cat last week.  Vaccinations are up-to-date.  Worsening pain and swelling to his left arm.  Denies fever but states that he has had subjective chills and feeling very lightheaded like he might pass out.  Denies any chest pain or shortness of breath.  Denies nausea vomiting, diarrhea or abdominal pain.  No dysuria.  Drove approximately 600 miles yesterday to and from [facility] [place]  which she states that he does as a daily basis.  No history of DVT or PE.  No longer with tobacco use, last use tobacco approximately 40 years ago.     Physical Exam   Triage Vital Signs: ED Triage Vitals [11/09/23 0748]  Encounter Vitals Group     BP (!) 164/68     Girls Systolic BP Percentile      Girls Diastolic BP Percentile      Boys Systolic BP Percentile      Boys Diastolic BP Percentile      Pulse Rate 99     Resp 18     Temp 98.1 F (36.7 C)     Temp Source Oral     SpO2 98 %     Weight      Height      Head Circumference      Peak Flow      Pain Score 10     Pain Loc      Pain Education      Exclude from Growth Chart     Most recent  vital signs: Vitals:   11/09/23 1030 11/09/23 1130  BP: (!) 146/64 136/71  Pulse: 94 89  Resp: 16 (!) 21  Temp:    SpO2: 97% 95%    Physical Exam Constitutional:      Appearance: He is well-developed.  HENT:     Head: Atraumatic.  Eyes:     Conjunctiva/sclera: Conjunctivae normal.  Cardiovascular:     Rate and Rhythm: Regular rhythm.  Pulmonary:     Effort: No respiratory distress.  Musculoskeletal:     Cervical back: Normal range of motion.     Comments: Left wrist with significant erythema warmth and swelling.  Red streaking up the left arm.  No crepitance.  No obvious fluctuance.  Swelling to the hand but good grip strength with flexion and extension.  Skin:    General: Skin is warm.  Neurological:     Mental Status: He is alert. Mental status is at baseline.     IMPRESSION / MDM / ASSESSMENT AND PLAN / ED COURSE  I reviewed the triage vital  signs and the nursing notes.  Differential diagnosis including sinus infection, pneumonia, COPD exacerbation, cellulitis secondary to cat bite  Lower suspicion for pulmonary embolism, did have significant travel which she has done on a daily basis over the past 10 years, no chest pain or shortness of breath, 98% on room air and no shortness of breath at this time.  Blood cultures obtained will obtain a lactic acid.  EKG  I, Clotilda Punter, the attending physician, personally viewed and interpreted this ECG. EKG showed normal sinus rhythm.  Normal intervals.  No significant elevation of ST intervals.  No significant change when compared to prior EKG.  No findings of acute ischemia or dysrhythmia.  No tachycardic or bradycardic dysrhythmias while on cardiac telemetry.  RADIOLOGY Chest x-ray with no signs of pneumonia  X-ray of the left wrist with no foreign body  LABS (all labs ordered are listed, but only abnormal results are displayed) Labs interpreted as -    Labs Reviewed  COMPREHENSIVE METABOLIC PANEL WITH GFR -  Abnormal; Notable for the following components:      Result Value   Sodium 132 (*)    Glucose, Bld 173 (*)    BUN 25 (*)    Calcium 8.5 (*)    All other components within normal limits  CBC WITH DIFFERENTIAL/PLATELET - Abnormal; Notable for the following components:   WBC 17.8 (*)    RBC 3.81 (*)    Hemoglobin 12.4 (*)    HCT 35.7 (*)    Neutro Abs 14.7 (*)    Monocytes Absolute 1.8 (*)    Abs Immature Granulocytes 0.11 (*)    All other components within normal limits  URINALYSIS, W/ REFLEX TO CULTURE (INFECTION SUSPECTED) - Abnormal; Notable for the following components:   Color, Urine YELLOW (*)    APPearance CLEAR (*)    All other components within normal limits  RESP PANEL BY RT-PCR (RSV, FLU A&B, COVID)  RVPGX2  CULTURE, BLOOD (ROUTINE X 2)  CULTURE, BLOOD (ROUTINE X 2)  LACTIC ACID, PLASMA  PROTIME-INR  TROPONIN I (HIGH SENSITIVITY)  TROPONIN I (HIGH SENSITIVITY)     MDM    Clinical Course as of 11/09/23 1133  Tue Nov 09, 2023  1044 States his pain is significantly improved after the morphine .  On reevaluation no progression of the cellulitis to his left upper extremity.  Full range of motion of the left wrist with only minimal pain had a very low suspicion for septic joint.  No signs of a flexor tenosynovitis.  Discussed admission with the patient patient states that he would prefer to be discharged home.  States that he would call his primary care physician and schedule a follow-up appointment to be seen tomorrow.  Given that the patient has a normal lactic acid and is afebrile and otherwise hemodynamically stable I do feel that this would be appropriate given Augmentin  should cover his cellulitis and be the first-line agent given the cat bite.  States that he would return immediately if he had any progression of his cellulitis, high fever or any worsening symptoms. [SM]    Clinical Course User Index [SM] Punter Clotilda, MD   On reevaluation patient states he is  feeling much better.  Discussed admission and patient states that he would like to be discharged home with outpatient follow-up with his primary care physician.  Tolerating p.o. antibiotics in the emergency department.  Remains hemodynamically stable.  Normal lactic acid.  Does have significant leukocytosis.  No findings of  urinary tract infection noted.  No chest pain at this time and serial troponins are negative.  Discharged home with a prescription for Augmentin  and pain medication.  Discussed follow-up with primary care for the next 1 to 2 days for reevaluation.  Discussed return precautions.  No questions at time of discharge.  PROCEDURES:  Critical Care performed: No  Procedures  Patient's presentation is most consistent with acute presentation with potential threat to life or bodily function.   MEDICATIONS ORDERED IN ED: Medications  morphine  (PF) 4 MG/ML injection 4 mg (4 mg Intravenous Given 11/09/23 0825)  amoxicillin -clavulanate (AUGMENTIN ) 875-125 MG per tablet 1 tablet (1 tablet Oral Given 11/09/23 0900)  sodium chloride  0.9 % bolus 500 mL (0 mLs Intravenous Stopped 11/09/23 1010)  morphine  (PF) 4 MG/ML injection 6 mg (6 mg Intravenous Given 11/09/23 0926)    FINAL CLINICAL IMPRESSION(S) / ED DIAGNOSES   Final diagnoses:  Cat bite, initial encounter  Cellulitis of left upper extremity     Rx / DC Orders   ED Discharge Orders          Ordered    amoxicillin -clavulanate (AUGMENTIN ) 875-125 MG tablet  2 times daily        11/09/23 1127    oxyCODONE -acetaminophen  (PERCOCET) 5-325 MG tablet  Every 4 hours PRN        11/09/23 1127    ondansetron  (ZOFRAN -ODT) 4 MG disintegrating tablet  Every 8 hours PRN        11/09/23 1127             Note:  This document was prepared using Dragon voice recognition software and may include unintentional dictation errors.   Suzanne Kirsch, MD 11/09/23 1133

## 2023-11-10 ENCOUNTER — Other Ambulatory Visit: Payer: Self-pay

## 2023-11-10 ENCOUNTER — Inpatient Hospital Stay
Admission: EM | Admit: 2023-11-10 | Discharge: 2023-11-15 | DRG: 603 | Disposition: A | Discharge status: Home / Self Care | Attending: Hospitalist | Admitting: Hospitalist

## 2023-11-10 DIAGNOSIS — Z7951 Long term (current) use of inhaled steroids: Secondary | ICD-10-CM

## 2023-11-10 DIAGNOSIS — Z87891 Personal history of nicotine dependence: Secondary | ICD-10-CM

## 2023-11-10 DIAGNOSIS — E785 Hyperlipidemia, unspecified: Secondary | ICD-10-CM | POA: Diagnosis present

## 2023-11-10 DIAGNOSIS — Z7982 Long term (current) use of aspirin: Secondary | ICD-10-CM

## 2023-11-10 DIAGNOSIS — Z833 Family history of diabetes mellitus: Secondary | ICD-10-CM

## 2023-11-10 DIAGNOSIS — I252 Old myocardial infarction: Secondary | ICD-10-CM

## 2023-11-10 DIAGNOSIS — L03114 Cellulitis of left upper limb: Secondary | ICD-10-CM | POA: Diagnosis present

## 2023-11-10 DIAGNOSIS — Z8701 Personal history of pneumonia (recurrent): Secondary | ICD-10-CM

## 2023-11-10 DIAGNOSIS — Z8042 Family history of malignant neoplasm of prostate: Secondary | ICD-10-CM | POA: Diagnosis not present

## 2023-11-10 DIAGNOSIS — Z9889 Other specified postprocedural states: Secondary | ICD-10-CM

## 2023-11-10 DIAGNOSIS — E871 Hypo-osmolality and hyponatremia: Secondary | ICD-10-CM | POA: Diagnosis present

## 2023-11-10 DIAGNOSIS — M65132 Other infective (teno)synovitis, left wrist: Secondary | ICD-10-CM | POA: Diagnosis present

## 2023-11-10 DIAGNOSIS — Z951 Presence of aortocoronary bypass graft: Secondary | ICD-10-CM | POA: Diagnosis not present

## 2023-11-10 DIAGNOSIS — K219 Gastro-esophageal reflux disease without esophagitis: Secondary | ICD-10-CM | POA: Diagnosis present

## 2023-11-10 DIAGNOSIS — Z8719 Personal history of other diseases of the digestive system: Secondary | ICD-10-CM

## 2023-11-10 DIAGNOSIS — Z888 Allergy status to other drugs, medicaments and biological substances status: Secondary | ICD-10-CM

## 2023-11-10 DIAGNOSIS — I10 Essential (primary) hypertension: Secondary | ICD-10-CM | POA: Diagnosis present

## 2023-11-10 DIAGNOSIS — M1990 Unspecified osteoarthritis, unspecified site: Secondary | ICD-10-CM | POA: Diagnosis present

## 2023-11-10 DIAGNOSIS — Z79899 Other long term (current) drug therapy: Secondary | ICD-10-CM

## 2023-11-10 DIAGNOSIS — Z981 Arthrodesis status: Secondary | ICD-10-CM | POA: Diagnosis not present

## 2023-11-10 DIAGNOSIS — S61452A Open bite of left hand, initial encounter: Secondary | ICD-10-CM | POA: Diagnosis present

## 2023-11-10 DIAGNOSIS — Z860100 Personal history of colon polyps, unspecified: Secondary | ICD-10-CM | POA: Diagnosis not present

## 2023-11-10 DIAGNOSIS — E119 Type 2 diabetes mellitus without complications: Secondary | ICD-10-CM | POA: Diagnosis present

## 2023-11-10 DIAGNOSIS — F419 Anxiety disorder, unspecified: Secondary | ICD-10-CM | POA: Diagnosis present

## 2023-11-10 DIAGNOSIS — J449 Chronic obstructive pulmonary disease, unspecified: Secondary | ICD-10-CM | POA: Diagnosis present

## 2023-11-10 DIAGNOSIS — Z8614 Personal history of Methicillin resistant Staphylococcus aureus infection: Secondary | ICD-10-CM

## 2023-11-10 DIAGNOSIS — I2510 Atherosclerotic heart disease of native coronary artery without angina pectoris: Secondary | ICD-10-CM | POA: Diagnosis present

## 2023-11-10 DIAGNOSIS — W5501XA Bitten by cat, initial encounter: Secondary | ICD-10-CM

## 2023-11-10 DIAGNOSIS — M65932 Unspecified synovitis and tenosynovitis, left forearm: Secondary | ICD-10-CM

## 2023-11-10 LAB — BASIC METABOLIC PANEL WITH GFR
Anion gap: 9 (ref 5–15)
BUN: 26 mg/dL — ABNORMAL HIGH (ref 8–23)
CO2: 22 mmol/L (ref 22–32)
Calcium: 8.5 mg/dL — ABNORMAL LOW (ref 8.9–10.3)
Chloride: 102 mmol/L (ref 98–111)
Creatinine, Ser: 1.24 mg/dL (ref 0.61–1.24)
GFR, Estimated: 60 mL/min — ABNORMAL LOW (ref >60)
Glucose, Bld: 107 mg/dL — ABNORMAL HIGH (ref 70–99)
Potassium: 4.9 mmol/L (ref 3.5–5.1)
Sodium: 133 mmol/L — ABNORMAL LOW (ref 135–145)

## 2023-11-10 LAB — CBC WITH DIFFERENTIAL/PLATELET
Abs Immature Granulocytes: 0.07 K/uL (ref 0.00–0.07)
Basophils Absolute: 0.1 K/uL (ref 0.0–0.1)
Basophils Relative: 1 %
Eosinophils Absolute: 0.2 K/uL (ref 0.0–0.5)
Eosinophils Relative: 1 %
HCT: 36.5 % — ABNORMAL LOW (ref 39.0–52.0)
Hemoglobin: 12.1 g/dL — ABNORMAL LOW (ref 13.0–17.0)
Immature Granulocytes: 1 %
Lymphocytes Relative: 14 %
Lymphs Abs: 1.8 K/uL (ref 0.7–4.0)
MCH: 32.2 pg (ref 26.0–34.0)
MCHC: 33.2 g/dL (ref 30.0–36.0)
MCV: 97.1 fL (ref 80.0–100.0)
Monocytes Absolute: 1.7 K/uL — ABNORMAL HIGH (ref 0.1–1.0)
Monocytes Relative: 13 %
Neutro Abs: 9.3 K/uL — ABNORMAL HIGH (ref 1.7–7.7)
Neutrophils Relative %: 70 %
Platelets: 188 K/uL (ref 150–400)
RBC: 3.76 MIL/uL — ABNORMAL LOW (ref 4.22–5.81)
RDW: 12.8 % (ref 11.5–15.5)
WBC: 13.2 K/uL — ABNORMAL HIGH (ref 4.0–10.5)
nRBC: 0 % (ref 0.0–0.2)

## 2023-11-10 MED ORDER — ACETAMINOPHEN 325 MG PO TABS
650.0000 mg | ORAL_TABLET | Freq: Four times a day (QID) | ORAL | Status: DC | PRN
  Administered 2023-11-12: 650 mg via ORAL
  Filled 2023-11-10: qty 2

## 2023-11-10 MED ORDER — BENZONATATE 100 MG PO CAPS: 200.0000 mg | ORAL_CAPSULE | Freq: Three times a day (TID) | ORAL | Status: DC | PRN

## 2023-11-10 MED ORDER — SENNOSIDES-DOCUSATE SODIUM 8.6-50 MG PO TABS: 1.0000 | ORAL_TABLET | Freq: Every evening | ORAL | Status: DC | PRN

## 2023-11-10 MED ORDER — LOSARTAN POTASSIUM 50 MG PO TABS
50.0000 mg | ORAL_TABLET | Freq: Every day | ORAL | Status: DC
  Administered 2023-11-11 – 2023-11-15 (×5): 50 mg via ORAL
  Filled 2023-11-10 (×5): qty 1

## 2023-11-10 MED ORDER — ENOXAPARIN SODIUM 60 MG/0.6ML IJ SOSY
50.0000 mg | PREFILLED_SYRINGE | INTRAMUSCULAR | Status: DC
  Administered 2023-11-10 – 2023-11-14 (×5): 50 mg via SUBCUTANEOUS
  Filled 2023-11-10 (×5): qty 0.6

## 2023-11-10 MED ORDER — ADULT MULTIVITAMIN W/MINERALS CH
1.0000 | ORAL_TABLET | Freq: Every day | ORAL | Status: DC
  Administered 2023-11-11 – 2023-11-15 (×5): 1 via ORAL
  Filled 2023-11-10 (×5): qty 1

## 2023-11-10 MED ORDER — VANCOMYCIN HCL 1500 MG/300ML IV SOLN
1500.0000 mg | Freq: Once | INTRAVENOUS | Status: AC
  Administered 2023-11-10: 1500 mg via INTRAVENOUS
  Filled 2023-11-10: qty 300

## 2023-11-10 MED ORDER — BISACODYL 5 MG PO TBEC
5.0000 mg | DELAYED_RELEASE_TABLET | Freq: Every day | ORAL | Status: DC | PRN
  Administered 2023-11-11: 5 mg via ORAL
  Filled 2023-11-10: qty 1

## 2023-11-10 MED ORDER — PANTOPRAZOLE SODIUM 40 MG PO TBEC
40.0000 mg | DELAYED_RELEASE_TABLET | Freq: Every day | ORAL | Status: DC
  Administered 2023-11-11 – 2023-11-15 (×5): 40 mg via ORAL
  Filled 2023-11-10 (×5): qty 1

## 2023-11-10 MED ORDER — MORPHINE SULFATE (PF) 2 MG/ML IV SOLN
2.0000 mg | INTRAVENOUS | Status: DC | PRN
  Administered 2023-11-10: 2 mg via INTRAVENOUS
  Filled 2023-11-10: qty 1

## 2023-11-10 MED ORDER — OXYCODONE HCL 5 MG PO TABS
5.0000 mg | ORAL_TABLET | ORAL | Status: DC | PRN
  Administered 2023-11-11 – 2023-11-14 (×6): 5 mg via ORAL
  Filled 2023-11-10 (×6): qty 1

## 2023-11-10 MED ORDER — ALBUTEROL SULFATE (2.5 MG/3ML) 0.083% IN NEBU: 2.5000 mg | INHALATION_SOLUTION | RESPIRATORY_TRACT | Status: DC | PRN

## 2023-11-10 MED ORDER — GABAPENTIN 300 MG PO CAPS
300.0000 mg | ORAL_CAPSULE | Freq: Three times a day (TID) | ORAL | Status: DC
  Administered 2023-11-10 – 2023-11-15 (×14): 300 mg via ORAL
  Filled 2023-11-10 (×14): qty 1

## 2023-11-10 MED ORDER — ALBUTEROL SULFATE HFA 108 (90 BASE) MCG/ACT IN AERS: 2.0000 | INHALATION_SPRAY | RESPIRATORY_TRACT | Status: DC | PRN

## 2023-11-10 MED ORDER — ESCITALOPRAM OXALATE 10 MG PO TABS
20.0000 mg | ORAL_TABLET | Freq: Every day | ORAL | Status: DC
  Administered 2023-11-10 – 2023-11-15 (×6): 20 mg via ORAL
  Filled 2023-11-10 (×6): qty 2

## 2023-11-10 MED ORDER — HYDRALAZINE HCL 20 MG/ML IJ SOLN
10.0000 mg | Freq: Four times a day (QID) | INTRAMUSCULAR | Status: DC | PRN
  Administered 2023-11-10: 10 mg via INTRAVENOUS
  Filled 2023-11-10: qty 1

## 2023-11-10 MED ORDER — ASPIRIN 81 MG PO TBEC
81.0000 mg | DELAYED_RELEASE_TABLET | Freq: Every day | ORAL | Status: DC
  Administered 2023-11-11 – 2023-11-15 (×5): 81 mg via ORAL
  Filled 2023-11-10 (×5): qty 1

## 2023-11-10 MED ORDER — IPRATROPIUM BROMIDE 0.06 % NA SOLN: 2.0000 | Freq: Four times a day (QID) | NASAL | Status: DC | PRN

## 2023-11-10 MED ORDER — DOCUSATE SODIUM 100 MG PO CAPS
100.0000 mg | ORAL_CAPSULE | Freq: Every day | ORAL | Status: DC
  Administered 2023-11-11 – 2023-11-15 (×4): 100 mg via ORAL
  Filled 2023-11-10 (×4): qty 1

## 2023-11-10 MED ORDER — ONDANSETRON HCL 4 MG PO TABS: 4.0000 mg | ORAL_TABLET | Freq: Four times a day (QID) | ORAL | Status: DC | PRN

## 2023-11-10 MED ORDER — IPRATROPIUM-ALBUTEROL 0.5-2.5 (3) MG/3ML IN SOLN: 3.0000 mL | Freq: Four times a day (QID) | RESPIRATORY_TRACT | Status: DC | PRN

## 2023-11-10 MED ORDER — ACETAMINOPHEN 650 MG RE SUPP: 650.0000 mg | Freq: Four times a day (QID) | RECTAL | Status: DC | PRN

## 2023-11-10 MED ORDER — FLUTICASONE FUROATE-VILANTEROL 100-25 MCG/ACT IN AEPB
1.0000 | INHALATION_SPRAY | Freq: Every day | RESPIRATORY_TRACT | Status: DC
  Administered 2023-11-11 – 2023-11-14 (×4): 1 via RESPIRATORY_TRACT
  Filled 2023-11-10 (×2): qty 28

## 2023-11-10 MED ORDER — ONDANSETRON HCL 4 MG/2ML IJ SOLN
4.0000 mg | Freq: Four times a day (QID) | INTRAMUSCULAR | Status: DC | PRN
  Filled 2023-11-10: qty 2

## 2023-11-10 MED ORDER — SODIUM CHLORIDE 0.9 % IV SOLN
100.0000 mg | Freq: Once | INTRAVENOUS | Status: AC
  Administered 2023-11-10: 100 mg via INTRAVENOUS
  Filled 2023-11-10: qty 100

## 2023-11-10 MED ORDER — GABAPENTIN 300 MG PO CAPS
600.0000 mg | ORAL_CAPSULE | Freq: Three times a day (TID) | ORAL | Status: DC
Start: 2023-11-10 — End: 2023-11-10
  Administered 2023-11-10: 600 mg via ORAL
  Filled 2023-11-10: qty 2

## 2023-11-10 NOTE — ED Provider Notes (Addendum)
 [facility] Provider Note    Event Date/Time   First MD Initiated Contact with Patient 11/10/23 1109     (approximate)   History   Chief Complaint: Left arm pain  HPI  Zachary SALSGIVER Sr. is a 78 y.o. male with a history of COPD, diabetes, hypertension who comes ED with worsening pain and swelling of the left arm after being bitten on the hand by his cat.  Was seen in the ED yesterday, diagnosed with cellulitis, wanted to avoid hospitalization and so was discharged on Augmentin .  He has taken 3 doses so far since then, with symptoms continue to worsen.  Denies fever chills chest pain shortness of breath.  No lightheadedness.        Past Medical History:  Diagnosis Date   Anxiety    Arthritis    COPD (chronic obstructive pulmonary disease) (HCC)    Coronary artery disease    Diabetes mellitus without complication (HCC)    diet controlled   GERD (gastroesophageal reflux disease)    Hemorrhoids    History of colon polyps    Hx of seasonal allergies    Hyperlipidemia    Hypertension    MRSA infection    a sore on face   Myocardial infarction [facility]) 2010   Pneumonia     Current Outpatient Rx   Order #: [id number] Class: OTC   Order #: [id number] Class: Historical Med   Order #: [id number] Class: Historical Med   Order #: [id number] Class: OTC   Order #: [id number] Class: Normal   Order #: [id number] Class: Historical Med   Order #: [id number] Class: Historical Med   Order #: [id number] Class: Normal   Order #: [id number] Class: OTC   Order #: [id number] Class: Historical Med   Order #: [id number] Class: Historical Med   Order #: [id number] Class: Historical Med   Order #: [id number] Class: Historical Med   Order #: [id number] Class: Historical Med   Order #: [id number] Class: Historical Med   Order #: [id number] Class: Historical Med   Order #: [id number] Class: Normal   Order #: [id number] Class: Normal   Order #: [id number] Class: Normal   Order #: [id number] Class:  Historical Med   Order #: [id number] Class: Historical Med   Order #: [id number] Class: Historical Med   Order #: [id number] Class: Normal   Order #: [id number] Class: Historical Med   Order #: [id number] Class: Historical Med   Order #: [id number] Class: Normal   Order #: [id number] Class: Print   Order #: [id number] Class: Normal   Order #: [id number] Class: OTC   Order #: [id number] Class: Historical Med   Order #: [id number] Class: Normal   Order #: [id number] Class: OTC   Order #: [id number] Class: Historical Med    Past Surgical History:  Procedure Laterality Date   ANTERIOR CERVICAL DECOMP/DISCECTOMY FUSION N/A 09/27/2019   Procedure: ANTERIOR CERVICAL DECOMPRESSION/DISCECTOMY FUSION 3 LEVELS C4-7;  Surgeon: Clois Fret, MD;  Location: [facility] ORS;  Service: Neurosurgery;  Laterality: N/A;   CERVICAL SPINE SURGERY     COLONOSCOPY WITH ESOPHAGOGASTRODUODENOSCOPY (EGD)     COLONOSCOPY WITH PROPOFOL  N/A 09/06/2017   Procedure: COLONOSCOPY WITH PROPOFOL ;  Surgeon: Gaylyn Gladis PENNER, MD;  Location: [facility] ENDOSCOPY;  Service: Endoscopy;  Laterality: N/A;   COLONOSCOPY WITH PROPOFOL  N/A 12/08/2021   Procedure: COLONOSCOPY WITH PROPOFOL ;  Surgeon: Maryruth Ole DASEN, MD;  Location: [facility] ENDOSCOPY;  Service: Endoscopy;  Laterality: N/A;   CORONARY ARTERY BYPASS GRAFT  2010   3 vessel   EYE SURGERY     HEMORRHOID SURGERY  20 years ago   twice   NASAL SEPTOPLASTY W/ TURBINOPLASTY  Bilateral 11/04/2017   Procedure: NASAL SEPTOPLASTY WITH INFERIOR TURBINATE REDUCTION;  Surgeon: Edda Mt, MD;  Location: [facility];  Service: ENT;  Laterality: Bilateral;   RETINAL DETACHMENT SURGERY Left 08/2019   RETINAL TEAR REPAIR CRYOTHERAPY Right    VEIN BYPASS SURGERY  04/2008   3 vessel with vein graft and stent placement    Physical Exam   Triage Vital Signs: ED Triage Vitals  Encounter Vitals Group     BP 11/10/23 1102 (!) 155/93     Girls Systolic BP Percentile --      Girls Diastolic BP  Percentile --      Boys Systolic BP Percentile --      Boys Diastolic BP Percentile --      Pulse Rate 11/10/23 1102 85     Resp 11/10/23 1102 19     Temp 11/10/23 1102 98 F (36.7 C)     Temp Source 11/10/23 1102 Oral     SpO2 11/10/23 1102 96 %     Weight 11/10/23 1103 (P) 235 lb (106.6 kg)     Height 11/10/23 1103 (P) 6' (1.829 m)     Head Circumference --      Peak Flow --      Pain Score 11/10/23 1104 0     Pain Loc --      Pain Education --      Exclude from Growth Chart --     Most recent vital signs: Vitals:   11/10/23 1102  BP: (!) 155/93  Pulse: 85  Resp: 19  Temp: 98 F (36.7 C)  SpO2: 96%    General: Awake, no distress.  CV:  Good peripheral perfusion.  Regular rate rhythm Resp:  Normal effort.  Clear lungs Abd:  No distention.  Soft nontender Other:  Left upper extremity with diffuse erythema edema tenderness and warmth from the proximal forearm and distally through the hand.  Well demarcated boundary.  There is some mild swelling of the fingers as well.  Full range of motion in the hand and wrist and fingers.  No crepitus.  No erythematous streaking  ED Results / Procedures / Treatments   Labs (all labs ordered are listed, but only abnormal results are displayed) Labs Reviewed  BASIC METABOLIC PANEL WITH GFR - Abnormal; Notable for the following components:      Result Value   Sodium 133 (*)    Glucose, Bld 107 (*)    BUN 26 (*)    Calcium 8.5 (*)    GFR, Estimated 60 (*)    All other components within normal limits  CBC WITH DIFFERENTIAL/PLATELET - Abnormal; Notable for the following components:   WBC 13.2 (*)    RBC 3.76 (*)    Hemoglobin 12.1 (*)    HCT 36.5 (*)    Neutro Abs 9.3 (*)    Monocytes Absolute 1.7 (*)    All other components within normal limits  CBC WITH DIFFERENTIAL/PLATELET     EKG    RADIOLOGY    PROCEDURES:  Procedures   MEDICATIONS ORDERED IN ED: Medications  vancomycin  (VANCOREADY) IVPB 1500 mg/300 mL (has  no administration in time range)  doxycycline  (VIBRAMYCIN ) 100 mg in sodium chloride  0.9 % 250 mL IVPB (100 mg Intravenous New Bag/Given 11/10/23 1236)     IMPRESSION / MDM / ASSESSMENT AND PLAN / ED COURSE  I reviewed the triage vital signs and the nursing notes.  DDx: Cellulitis.  Doubt abscess, necrotizing fasciitis, osteomyelitis, septic arthritis, flexor  tenosynovitis, DVT  Patient's presentation is most consistent with acute presentation with potential threat to life or bodily function.  Patient presents with cellulitis of the left hand after cat bite, symptoms worsening despite Augmentin .  Had leukocytosis yesterday.  Not septic based on vital signs today.  Due to the severity of his illness and his comorbidities, will need to hospitalize for IV antibiotics and further management.   Clinical Course as of 11/10/23 1313  Wed Nov 10, 2023  1312 Case discussed with hospitalist [PS]    Clinical Course User Index [PS] Viviann Pastor, MD     FINAL CLINICAL IMPRESSION(S) / ED DIAGNOSES   Final diagnoses:  Cellulitis of left upper extremity     Rx / DC Orders   ED Discharge Orders     None        Note:  This document was prepared using Dragon voice recognition software and may include unintentional dictation errors.   Viviann Pastor, MD 11/10/23 1138    Viviann Pastor, MD 11/10/23 [phone]

## 2023-11-10 NOTE — Progress Notes (Signed)
 Anticoagulation monitoring(Lovenox ):  78yo  F ordered Lovenox  40 mg Q24h    Filed Weights   11/10/23 1103 11/10/23 1104  Weight: (P) 106.6 kg (235 lb) 106.6 kg (235 lb 0.2 oz)   BMI 31.8   Lab Results  Component Value Date   CREATININE 1.24 11/10/2023   CREATININE 1.21 11/09/2023   CREATININE 1.56 (H) 07/13/2021   CrCl cannot be calculated (Unknown ideal weight.). Hemoglobin & Hematocrit     Component Value Date/Time   HGB 12.1 (L) 11/10/2023 1233   HGB 16.0 08/04/2011 0422   HCT 36.5 (L) 11/10/2023 1233   HCT 46.8 08/04/2011 0422     Per Protocol for Patient with estCrcl > 30 ml/min and BMI > 30, will transition to Lovenox  0.5 mg/kg Q24h       Allean Haas PharmD Clinical Pharmacist 11/10/2023

## 2023-11-10 NOTE — ED Triage Notes (Signed)
 Returns to ED, seen yesterday for left hand swelling. Antibioitcs started, but returns due to redness and worsening welling.  Ring on left fourth finger, unable to remove in triage. Brisk cap refill to all digits.

## 2023-11-10 NOTE — H&P (Signed)
 History and Physical    Zachary Trevino FMW:[id number] DOB: [birth year] DOA: 11/10/2023  PCP: Pcp, No Patient coming from: Home  Chief Complaint:   HPI: Zachary Trevino Sr. is a 78 y.o. male with medical history significant of hypertension, hyperlipidemia, CAD status post CABG x 3, COPD, diabetes diet-controlled who presents to the hospital with complaint of left arm swelling after cat bite.  Patient states that he was bit by the cat few days ago.  And that he had been washing including the arm.  Initially came to the ER yesterday and received oral antibiotics.  It was recommended that he be admitted but due to work obligations patient decided to leave.  He returns today as his arm is more erythematous, with increased swelling and edema. ED Course:  In the ER, BP 155/93, HR 85, RR 19, O2 saturation 96% on RA,  and Tmax 98. Cbc demonstrated wbc 13.2,  hb/hct 12.1/36.5, and platelet 188. Chemistry demonstrated Na 133, K 4.9, Cl 102, bicarb 22, Bun/Cr 26/1.24 and glucose 107.  Left wrist imaging done yesterday demonstrated no osseous abnormality of the left wrist joint.  No radiopaque foreign bodies. EKG  has not been released Review of Systems:  All systems reviewed and apart from history of presenting illness, are negative.  Past Medical History:  Diagnosis Date   Anxiety    Arthritis    COPD (chronic obstructive pulmonary disease) (HCC)    Coronary artery disease    Diabetes mellitus without complication (HCC)    diet controlled   GERD (gastroesophageal reflux disease)    Hemorrhoids    History of colon polyps    Hx of seasonal allergies    Hyperlipidemia    Hypertension    MRSA infection    a sore on face   Myocardial infarction (HCC) 2010   Pneumonia     Past Surgical History:  Procedure Laterality Date   ANTERIOR CERVICAL DECOMP/DISCECTOMY FUSION N/A 09/27/2019   Procedure: ANTERIOR CERVICAL DECOMPRESSION/DISCECTOMY FUSION 3 LEVELS C4-7;  Surgeon: Clois Fret,  MD;  Location: [facility] ORS;  Service: Neurosurgery;  Laterality: N/A;   CERVICAL SPINE SURGERY     COLONOSCOPY WITH ESOPHAGOGASTRODUODENOSCOPY (EGD)     COLONOSCOPY WITH PROPOFOL  N/A 09/06/2017   Procedure: COLONOSCOPY WITH PROPOFOL ;  Surgeon: Gaylyn Gladis PENNER, MD;  Location: [facility] ENDOSCOPY;  Service: Endoscopy;  Laterality: N/A;   COLONOSCOPY WITH PROPOFOL  N/A 12/08/2021   Procedure: COLONOSCOPY WITH PROPOFOL ;  Surgeon: Maryruth Ole DASEN, MD;  Location: [facility] ENDOSCOPY;  Service: Endoscopy;  Laterality: N/A;   CORONARY ARTERY BYPASS GRAFT  2010   3 vessel   EYE SURGERY     HEMORRHOID SURGERY  20 years ago   twice   NASAL SEPTOPLASTY W/ TURBINOPLASTY Bilateral 11/04/2017   Procedure: NASAL SEPTOPLASTY WITH INFERIOR TURBINATE REDUCTION;  Surgeon: Juengel, Paul, MD;  Location: [facility];  Service: ENT;  Laterality: Bilateral;   RETINAL DETACHMENT SURGERY Left 08/2019   RETINAL TEAR REPAIR CRYOTHERAPY Right    VEIN BYPASS SURGERY  04/2008   3 vessel with vein graft and stent placement     reports that he quit smoking about 45 years ago. His smoking use included cigarettes. He started smoking about 70 years ago. He has never used smokeless tobacco. He reports that he does not currently use alcohol. He reports that he does not use drugs.  Allergies  Allergen Reactions   Evolocumab Dermatitis   Atorvastatin Palpitations   Rosuvastatin Palpitations    Family History  Problem Relation Age of Onset   Prostate cancer Father    Diabetes Father     Prior to Admission medications   Medication Sig Start Date End Date Taking? Authorizing Provider  acetaminophen  (TYLENOL ) 500 MG tablet Take 2 tablets (1,000 mg total) by mouth every 6 (six) hours. 09/28/19   Zdeb, Christine, NP  albuterol  (PROVENTIL  HFA;VENTOLIN  HFA) 108 (90 Base) MCG/ACT inhaler Inhale 2 puffs into the lungs every 4 (four) hours as needed for wheezing or shortness of breath.    [provider]  Alpha-Lipoic  Acid 200 MG CAPS Take 200 mg by mouth daily.     [provider]  alum & mag hydroxide-simeth (MAALOX/MYLANTA) 200-200-20 MG/5ML suspension Take 30 mLs by mouth every 6 (six) hours as needed for indigestion. 09/28/19   Zdeb, Christine, NP  amoxicillin -clavulanate (AUGMENTIN ) 875-125 MG tablet Take 1 tablet by mouth 2 (two) times daily for 7 days. 11/09/23 11/16/23  Suzanne Kirsch, MD  Ascorbic Acid (VITAMIN C) 1000 MG tablet Take 1,000 mg by mouth daily.    [provider]  aspirin  EC 81 MG tablet Take 81 mg by mouth daily. Swallow whole.    [provider]  benzonatate  (TESSALON ) 100 MG capsule Take 2 capsules (200 mg total) by mouth every 8 (eight) hours. 04/24/23   Bernardino Ditch, NP  bisacodyl  (DULCOLAX) 5 MG EC tablet Take 1 tablet (5 mg total) by mouth daily as needed for moderate constipation. 09/28/19   Zdeb, Christine, NP  Cholecalciferol (VITAMIN D3 PO) Take 2 tablets by mouth daily.    [provider]  Cinnamon 500 MG TABS Take 500 mg by mouth daily.     [provider]  COENZYME Q-10 PO Take 1 capsule by mouth daily.    [provider]  docusate sodium  (COLACE) 100 MG capsule Take 100 mg by mouth daily.    [provider]  escitalopram  (LEXAPRO ) 10 MG tablet Take 10 mg by mouth at bedtime.     [provider]  ezetimibe (ZETIA) 10 MG tablet Take 10 mg by mouth daily. 08/02/23 08/01/24  [provider]  Fluticasone -Salmeterol (ADVAIR) 100-50 MCG/DOSE AEPB Inhale 1 puff into the lungs 2 (two) times daily.    [provider]  gabapentin  (NEURONTIN ) 300 MG capsule Take 2 capsules (600 mg total) by mouth 3 (three) times daily. 01/22/22 01/22/23  Clois Fret, MD  HYDROcodone -acetaminophen  (NORCO) 5-325 MG tablet Take 1 tablet by mouth every 6 (six) hours as needed for moderate pain. 01/03/22   Charlene Debby BROCKS, PA-C  ipratropium (ATROVENT ) 0.06 % nasal spray Place 2 sprays into both nostrils 4 (four) times  daily. 04/24/23   Bernardino Ditch, NP  ipratropium-albuterol  (DUONEB) 0.5-2.5 (3) MG/3ML SOLN Take 3 mLs by nebulization every 6 (six) hours as needed (shortness of breath).     [provider]  losartan  (COZAAR ) 100 MG tablet Take 50 mg by mouth daily.    [provider]  lovastatin (MEVACOR) 40 MG tablet Take 40 mg by mouth at bedtime.    [provider]  menthol -cetylpyridinium (CEPACOL) 3 MG lozenge Take 1 lozenge (3 mg total) by mouth as needed for sore throat (sore throat). 09/28/19   Zdeb, Christine, NP  Multiple Vitamin (MULTIVITAMIN) tablet Take 1 tablet by mouth daily.    [provider]  omeprazole (PRILOSEC) 40 MG capsule Take 40 mg by mouth daily.  02/17/13   [provider]  ondansetron  (ZOFRAN -ODT) 4 MG disintegrating tablet Take 1 tablet (4 mg  total) by mouth every 8 (eight) hours as needed for nausea or vomiting. 11/09/23   Suzanne Kirsch, MD  oxyCODONE  (ROXICODONE ) 5 MG immediate release tablet Take 1 tablet (5 mg total) by mouth every 4 (four) hours as needed for moderate pain or severe pain (5mg  for moderate pain (3-6/10), 10mg  for severe pain (7-10/10)). 09/28/19   Zdeb, Christine, NP  oxyCODONE -acetaminophen  (PERCOCET) 5-325 MG tablet Take 1 tablet by mouth every 4 (four) hours as needed for up to 3 days for severe pain (pain score 7-10). 11/09/23 11/12/23  Suzanne Kirsch, MD  phenol (CHLORASEPTIC) 1.4 % LIQD Use as directed 1 spray in the mouth or throat as needed for throat irritation / pain. 09/28/19   Zdeb, Christine, NP  polyethylene glycol (MIRALAX  / GLYCOLAX ) 17 g packet Take 17 g by mouth daily.    [provider]  promethazine -dextromethorphan (PROMETHAZINE -DM) 6.25-15 MG/5ML syrup Take 5 mLs by mouth 4 (four) times daily as needed. 04/24/23   Bernardino Ditch, NP  senna (SENOKOT) 8.6 MG TABS tablet Take 2 tablets (17.2 mg total) by mouth 2 (two) times daily. 09/28/19   Zdeb, Christine, NP  Turmeric 400 MG CAPS Take 400 mg by mouth daily.      [provider]    Physical Exam: Vitals:   11/10/23 1102 11/10/23 1103 11/10/23 1104  BP: (!) 155/93    Pulse: 85    Resp: 19    Temp: 98 F (36.7 C)    TempSrc: Oral    SpO2: 96%    Weight:  (P) 106.6 kg 106.6 kg  Height:  (P) 6' (1.829 m)     Physical Exam Constitutional:      General: He is not in acute distress.    Appearance: Normal appearance.  HENT:     Head: Normocephalic and atraumatic.  Eyes:     Extraocular Movements: Extraocular movements intact.     Conjunctiva/sclera: Conjunctivae normal.     Pupils: Pupils are equal, round, and reactive to light.  Cardiovascular:     Rate and Rhythm: Normal rate and regular rhythm.     Pulses: Normal pulses.     Heart sounds: Normal heart sounds.  Pulmonary:     Effort: Pulmonary effort is normal. No respiratory distress.     Breath sounds: Normal breath sounds. No wheezing, rhonchi or rales.  Abdominal:     General: Abdomen is flat. Bowel sounds are normal. There is no distension.     Palpations: Abdomen is soft.     Tenderness: There is no abdominal tenderness.  Musculoskeletal:        General: No deformity. Normal range of motion.  Skin:    General: Skin is warm and dry.     Coloration: Skin is not jaundiced.  Neurological:     General: No focal deficit present.     Mental Status: He is alert and oriented to person, place, and time. Mental status is at baseline.   Labs on Admission: I have personally reviewed following labs and imaging studies  CBC: Recent Labs  Lab 11/09/23 0811 11/10/23 1233  WBC 17.8* 13.2*  NEUTROABS 14.7* 9.3*  HGB 12.4* 12.1*  HCT 35.7* 36.5*  MCV 93.7 97.1  PLT 204 188   Basic Metabolic Panel: Recent Labs  Lab 11/09/23 0811 11/10/23 1154  NA 132* 133*  K 4.0 4.9  CL 99 102  CO2 22 22  GLUCOSE 173* 107*  BUN 25* 26*  CREATININE 1.21 1.24  CALCIUM 8.5* 8.5*  GFR: CrCl cannot be calculated (Unknown ideal weight.). Liver Function Tests: Recent Labs  Lab  11/09/23 0811  AST 18  ALT 13  ALKPHOS 63  BILITOT 1.0  PROT 6.9  ALBUMIN 3.8   No results for input(s): LIPASE, AMYLASE in the last 168 hours. No results for input(s): AMMONIA in the last 168 hours. Coagulation Profile: Recent Labs  Lab 11/09/23 0811  INR 1.1   Cardiac Enzymes: No results for input(s): CKTOTAL, CKMB, CKMBINDEX, TROPONINI in the last 168 hours. BNP (last 3 results) No results for input(s): PROBNP in the last 8760 hours. HbA1C: No results for input(s): HGBA1C in the last 72 hours. CBG: No results for input(s): GLUCAP in the last 168 hours. Lipid Profile: No results for input(s): CHOL, HDL, LDLCALC, TRIG, CHOLHDL, LDLDIRECT in the last 72 hours. Thyroid Function Tests: No results for input(s): TSH, T4TOTAL, FREET4, T3FREE, THYROIDAB in the last 72 hours. Anemia Panel: No results for input(s): VITAMINB12, FOLATE, FERRITIN, TIBC, IRON, RETICCTPCT in the last 72 hours. Urine analysis:    Component Value Date/Time   COLORURINE YELLOW (A) 11/09/2023 1012   APPEARANCEUR CLEAR (A) 11/09/2023 1012   LABSPEC 1.009 11/09/2023 1012   PHURINE 7.0 11/09/2023 1012   GLUCOSEU NEGATIVE 11/09/2023 1012   HGBUR NEGATIVE 11/09/2023 1012   BILIRUBINUR NEGATIVE 11/09/2023 1012   KETONESUR NEGATIVE 11/09/2023 1012   PROTEINUR NEGATIVE 11/09/2023 1012   NITRITE NEGATIVE 11/09/2023 1012   LEUKOCYTESUR NEGATIVE 11/09/2023 1012    Radiological Exams on Admission: DG Wrist Complete Left Result Date: 11/09/2023 CLINICAL DATA:  animal bite.  Pain/swelling. EXAM: LEFT WRIST - COMPLETE 3+ VIEW COMPARISON:  None Available. FINDINGS: No acute fracture or dislocation. No aggressive osseous lesion. Mild diffuse arthritis of imaged joints with asymmetric moderate-to-severe involvement of first carpometacarpal joint. No radiopaque foreign bodies. Soft tissues are within normal limits. No focal soft tissue swelling. No evidence of soft  tissue defect/tissue loss or air within the soft tissue. IMPRESSION: *No acute osseous abnormality of the left wrist joint. No radiopaque foreign bodies. Electronically Signed   By: Ree Molt M.D.   On: 11/09/2023 09:12   DG Chest Port 1 View Result Date: 11/09/2023 CLINICAL DATA:  Questionable sepsis - evaluate for abnormality. EXAM: PORTABLE CHEST 1 VIEW COMPARISON:  07/13/2021. FINDINGS: Linear area of scarring/atelectasis noted overlying the left lateral costophrenic angle. Bilateral lung fields are otherwise clear. No dense consolidation or lung collapse. Bilateral costophrenic angles are clear. Normal cardio-mediastinal silhouette. There are surgical staples along the heart border and sternotomy wires, status post CABG (coronary artery bypass graft). No acute osseous abnormalities. Cervicothoracic spinal fixation hardware noted. The soft tissues are within normal limits. IMPRESSION: No active disease. Electronically Signed   By: Ree Molt M.D.   On: 11/09/2023 09:10    EKG: Independently reviewed.   Assessment/Plan Principal Problem:   Cellulitis of arm, left  Left arm cellulitis Patient has been started on IV antibiotics Will monitor for clinical improvement  DM type 2 Diet controlled Add sliding scale if needed  Hypertension Patient normally on losartan  Will monitor and consider restarting   Hyponatremia Na is 133 Will hydrate with iV fluids Repeat labs in the morning  COPD Will continue home breathing treatments At present patient is stable  HLD CAD s/p CABG Continue statin therapy Patient is doing well and stable He is not currently on aspirin  at home   DVT prophylaxis: lovenox   Code Status: Full Family Communication: no  Disposition Plan:  inpatient    Consults  called: none Admission status: inpatient  Level of care: Level of care: general surgery  The medical decision making on this patient was of high complexity and the patient is at high risk  for clinical deterioration, therefore this is a level 3 visit. Bradly MARLA Drones MD [facility]  If 7PM-7AM, please contact night-coverage [email or web address]  11/10/2023, 1:16 PM

## 2023-11-11 ENCOUNTER — Inpatient Hospital Stay

## 2023-11-11 DIAGNOSIS — L03114 Cellulitis of left upper limb: Secondary | ICD-10-CM | POA: Diagnosis not present

## 2023-11-11 MED ORDER — GADOBUTROL 1 MMOL/ML IV SOLN
10.0000 mL | Freq: Once | INTRAVENOUS | Status: AC | PRN
  Administered 2023-11-11: 10 mL via INTRAVENOUS

## 2023-11-11 MED ORDER — SODIUM CHLORIDE 0.9 % IV SOLN
3.0000 g | Freq: Four times a day (QID) | INTRAVENOUS | Status: DC
  Administered 2023-11-11 – 2023-11-15 (×16): 3 g via INTRAVENOUS
  Filled 2023-11-11 (×17): qty 8

## 2023-11-11 NOTE — Progress Notes (Signed)
  PROGRESS NOTE    Zachary Trevino Sr.  FMW:[id number] DOB: 07-May-1945 DOA: 11/10/2023 PCP: Pcp, No  160A/160A-AA  LOS: 1 day   Brief hospital course:   Assessment & Plan:  Zachary Trevino Sr. is a 78 y.o. male with medical history significant of hypertension, hyperlipidemia, CAD status post CABG x 3, COPD, diabetes diet-controlled who presents to the hospital with complaint of left arm swelling after cat bite.  Patient states that he was bit by the cat few days ago.    Left arm cellulitis 2/2 Cat bite Started on Vanc and doxy on presentation.  MRI hand ordered on admission. --switch abx to Unasyn    DM type 2 --recent A1c 5.9 --no need for BG checks   Hypertension --cont losartan     Hyponatremia --low 130's   COPD --cont home bronchodilators   CAD s/p CABG --cont ASA    DVT prophylaxis: Lovenox  SQ Code Status: Full code  Family Communication: wife updated at bedside today Level of care: Telemetry Surgical Dispo:   The patient is from: home Anticipated d/c is to: home Anticipated d/c date is: 2-3 days   Subjective and Interval History:  Left hand and arm still erythematous and swollen, and felt weak.   Objective: Vitals:   11/10/23 2007 11/11/23 0447 11/11/23 0821 11/11/23 1625  BP: (!) 140/61 139/69 137/76 133/69  Pulse: 76 74 81 (!) 56  Resp: 20 20 20 18   Temp: 98.8 F (37.1 C) 98.2 F (36.8 C) 98.2 F (36.8 C) 98.6 F (37 C)  TempSrc:   Oral   SpO2: 96% 94% 94% 94%  Weight:      Height:        Intake/Output Summary (Last 24 hours) at 11/11/2023 1816 Last data filed at 11/11/2023 0900 Gross per 24 hour  Intake 480 ml  Output --  Net 480 ml   Filed Weights   11/10/23 1103 11/10/23 1104  Weight: (P) 106.6 kg 106.6 kg    Examination:   Constitutional: NAD, AAOx3 HEENT: conjunctivae and lids normal, EOMI CV: No cyanosis.   RESP: normal respiratory effort, on RA Extremities: erythema, edema over left hand and forearm SKIN: warm,  dry Neuro: II - XII grossly intact.   Psych: Normal mood and affect.  Appropriate judgement and reason   Data Reviewed: I have personally reviewed labs and imaging studies  Time spent: 50 minutes  Ellouise Haber, MD [facility] If 7PM-7AM, please contact night-coverage 11/11/2023, 6:16 PM

## 2023-11-11 NOTE — Plan of Care (Signed)

## 2023-11-12 DIAGNOSIS — M65932 Unspecified synovitis and tenosynovitis, left forearm: Secondary | ICD-10-CM

## 2023-11-12 DIAGNOSIS — L03114 Cellulitis of left upper limb: Secondary | ICD-10-CM | POA: Diagnosis not present

## 2023-11-12 NOTE — Consult Note (Signed)
 ORTHOPAEDIC CONSULTATION  REQUESTING PHYSICIAN: Awanda City, MD  Chief Complaint:   Left hand cat bite  History of Present Illness: Zachary RINEER Sr. is a 78 y.o. male  with medical history significant of hypertension, hyperlipidemia, CAD status post CABG x 3, COPD, diabetes diet-controlled scented to the hospital after a cat bite to the left dorsal hand and wrist.  Patient was trialed on oral antibiotics and after 1 day had worsening swelling and erythema over the dorsum of his left hand.  He was admitted for antibiotic treatment via IV and had an MRI of the hand was ordered on admission.  Orthopedics consulted for evaluation review of the MRI and treatment recommendations.  The patient reports no issues with his hand prior to this.  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  He is right-hand dominant for writing and other activities.  He denies any numbness or tingling in the hand this time and reports over the last 24 hours on IV antibiotics the swelling has significantly improved and his wrist and hand and is able to move the hand and wrist much better already.  Denies any fevers at this time.  Denies any other injuries  Past Medical History:  Diagnosis Date   Anxiety    Arthritis    COPD (chronic obstructive pulmonary disease) (HCC)    Coronary artery disease    Diabetes mellitus without complication (HCC)    diet controlled   GERD (gastroesophageal reflux disease)    Hemorrhoids    History of colon polyps    Hx of seasonal allergies    Hyperlipidemia    Hypertension    MRSA infection    a sore on face   Myocardial infarction (HCC) 2010   Pneumonia    Past Surgical History:  Procedure Laterality Date   ANTERIOR CERVICAL DECOMP/DISCECTOMY FUSION N/A 09/27/2019   Procedure: ANTERIOR CERVICAL DECOMPRESSION/DISCECTOMY FUSION 3 LEVELS C4-7;  Surgeon: Clois Fret, MD;  Location:  [facility] ORS;  Service: Neurosurgery;  Laterality: N/A;   CERVICAL SPINE SURGERY     COLONOSCOPY WITH ESOPHAGOGASTRODUODENOSCOPY (EGD)     COLONOSCOPY WITH PROPOFOL  N/A 09/06/2017   Procedure: COLONOSCOPY WITH PROPOFOL ;  Surgeon: Gaylyn Gladis PENNER, MD;  Location: [facility] ENDOSCOPY;  Service: Endoscopy;  Laterality: N/A;   COLONOSCOPY WITH PROPOFOL  N/A 12/08/2021   Procedure: COLONOSCOPY WITH PROPOFOL ;  Surgeon: Maryruth Ole DASEN, MD;  Location: [facility] ENDOSCOPY;  Service: Endoscopy;  Laterality: N/A;   CORONARY ARTERY BYPASS GRAFT  2010   3 vessel   EYE SURGERY     HEMORRHOID SURGERY  20 years ago   twice   NASAL SEPTOPLASTY W/ TURBINOPLASTY Bilateral 11/04/2017   Procedure: NASAL SEPTOPLASTY WITH INFERIOR TURBINATE REDUCTION;  Surgeon: Edda Mt, MD;  Location: [facility];  Service: ENT;  Laterality: Bilateral;   RETINAL DETACHMENT SURGERY Left 08/2019   RETINAL TEAR REPAIR CRYOTHERAPY Right    VEIN BYPASS SURGERY  04/2008   3 vessel with vein graft and stent placement   Social History   Socioeconomic History   Marital status: Married    Spouse name: Not on file   Number of children: Not on file   Years of education: Not on file   Highest education level: Not on file  Occupational History   Not on file  Tobacco Use   Smoking status: Former    Current packs/day: 0.00    Types: Cigarettes    Start date: 02/16/1953    Quit date:  02/16/1978    Years since quitting: 45.7   Smokeless tobacco: Never  Vaping Use   Vaping status: Never Used  Substance and Sexual Activity   Alcohol use: Not Currently   Drug use: No   Sexual activity: Not on file  Other Topics Concern   Not on file  Social History Narrative   Not on file   Social Drivers of Health   Financial Resource Strain: Low Risk  (08/24/2023)   Received from [facility]   Overall Financial Resource Strain (CARDIA)    Difficulty of Paying Living Expenses: Not hard at all  Food Insecurity: No Food  Insecurity (11/10/2023)   Hunger Vital Sign    Worried About Running Out of Food in the Last Year: Never true    Ran Out of Food in the Last Year: Never true  Transportation Needs: No Transportation Needs (11/10/2023)   PRAPARE - Administrator, Civil Service (Medical): No    Lack of Transportation (Non-Medical): No  Physical Activity: Not on file  Stress: Not on file  Social Connections: Moderately Isolated (11/10/2023)   Social Connection and Isolation Panel    Frequency of Communication with Friends and Family: Three times a week    Frequency of Social Gatherings with Friends and Family: Three times a week    Attends Religious Services: Never    Active Member of Clubs or Organizations: No    Attends Engineer, structural: Never    Marital Status: Married   Family History  Problem Relation Age of Onset   Prostate cancer Father    Diabetes Father    Allergies  Allergen Reactions   Evolocumab Dermatitis   Atorvastatin Palpitations   Rosuvastatin Palpitations   Prior to Admission medications   Medication Sig Start Date End Date Taking? Authorizing Provider  acetaminophen  (TYLENOL ) 500 MG tablet Take 2 tablets (1,000 mg total) by mouth every 6 (six) hours. 09/28/19  Yes Zdeb, Wanda, NP  Alpha-Lipoic Acid 200 MG CAPS Take 200 mg by mouth daily.    Yes [provider]  alum & mag hydroxide-simeth (MAALOX/MYLANTA) 200-200-20 MG/5ML suspension Take 30 mLs by mouth every 6 (six) hours as needed for indigestion. 09/28/19  Yes Zdeb, Wanda, NP  amoxicillin -clavulanate (AUGMENTIN ) 875-125 MG tablet Take 1 tablet by mouth 2 (two) times daily for 7 days. 11/09/23 11/16/23 Yes Mumma, Clotilda, MD  Ascorbic Acid (VITAMIN C) 1000 MG tablet Take 1,000 mg by mouth daily.   Yes [provider]  aspirin  EC 81 MG tablet Take 81 mg by mouth daily. Swallow whole.   Yes [provider]  bisacodyl  (DULCOLAX) 5 MG EC tablet Take 1 tablet (5 mg total) by mouth  daily as needed for moderate constipation. 09/28/19  Yes Zdeb, Wanda, NP  Cholecalciferol (VITAMIN D3 PO) Take 2 tablets by mouth daily.   Yes [provider]  Cinnamon 500 MG TABS Take 500 mg by mouth daily.    Yes [provider]  COENZYME Q-10 PO Take 1 capsule by mouth daily.   Yes [provider]  Fluticasone -Salmeterol (ADVAIR) 100-50 MCG/DOSE AEPB Inhale 1 puff into the lungs 2 (two) times daily.   Yes [provider]  gabapentin  (NEURONTIN ) 300 MG capsule Take 2 capsules (600 mg total) by mouth 3 (three) times daily. 01/22/22 11/10/23 Yes Clois Fret, MD  losartan  (COZAAR ) 100 MG tablet Take 50 mg by mouth daily.   Yes [provider]  lovastatin (MEVACOR) 20 MG tablet Take  20 mg by mouth daily at 6 PM. 11/10/23  Yes [provider]  menthol -cetylpyridinium (CEPACOL) 3 MG lozenge Take 1 lozenge (3 mg total) by mouth as needed for sore throat (sore throat). 09/28/19  Yes Zdeb, Wanda, NP  Multiple Vitamin (MULTIVITAMIN) tablet Take 1 tablet by mouth daily.   Yes [provider]  omeprazole (PRILOSEC) 40 MG capsule Take 40 mg by mouth daily.  02/17/13  Yes [provider]  ondansetron  (ZOFRAN -ODT) 4 MG disintegrating tablet Take 1 tablet (4 mg total) by mouth every 8 (eight) hours as needed for nausea or vomiting. 11/09/23  Yes Mumma, Clotilda, MD  oxyCODONE -acetaminophen  (PERCOCET) 5-325 MG tablet Take 1 tablet by mouth every 4 (four) hours as needed for up to 3 days for severe pain (pain score 7-10). 11/09/23 11/12/23 Yes Mumma, Clotilda, MD  phenol (CHLORASEPTIC) 1.4 % LIQD Use as directed 1 spray in the mouth or throat as needed for throat irritation / pain. 09/28/19  Yes Zdeb, Wanda, NP  polyethylene glycol (MIRALAX  / GLYCOLAX ) 17 g packet Take 17 g by mouth daily.   Yes [provider]  promethazine -dextromethorphan (PROMETHAZINE -DM) 6.25-15 MG/5ML syrup Take 5 mLs by mouth 4 (four) times daily as needed.  04/24/23  Yes Bernardino Ditch, NP  senna (SENOKOT) 8.6 MG TABS tablet Take 2 tablets (17.2 mg total) by mouth 2 (two) times daily. 09/28/19  Yes Zdeb, Wanda, NP  Turmeric 400 MG CAPS Take 400 mg by mouth daily.    Yes [provider]  albuterol  (PROVENTIL  HFA;VENTOLIN  HFA) 108 (90 Base) MCG/ACT inhaler Inhale 2 puffs into the lungs every 4 (four) hours as needed for wheezing or shortness of breath.    [provider]  benzonatate  (TESSALON ) 100 MG capsule Take 2 capsules (200 mg total) by mouth every 8 (eight) hours. Patient not taking: Reported on 11/10/2023 04/24/23   Bernardino Ditch, NP  docusate sodium  (COLACE) 100 MG capsule Take 100 mg by mouth daily.    [provider]  escitalopram  (LEXAPRO ) 10 MG tablet Take 10 mg by mouth at bedtime.  Patient not taking: Reported on 11/10/2023    [provider]  escitalopram  (LEXAPRO ) 20 MG tablet Take 20 mg by mouth daily.    [provider]  ezetimibe (ZETIA) 10 MG tablet Take 10 mg by mouth daily. Patient not taking: Reported on 11/10/2023 08/02/23 08/01/24  [provider]  HYDROcodone -acetaminophen  (NORCO) 5-325 MG tablet Take 1 tablet by mouth every 6 (six) hours as needed for moderate pain. Patient not taking: Reported on 11/10/2023 01/03/22   Charlene Ned C, PA-C  ipratropium (ATROVENT ) 0.06 % nasal spray Place 2 sprays into both nostrils 4 (four) times daily. Patient not taking: Reported on 11/10/2023 04/24/23   Bernardino Ditch, NP  ipratropium-albuterol  (DUONEB) 0.5-2.5 (3) MG/3ML SOLN Take 3 mLs by nebulization every 6 (six) hours as needed (shortness of breath).  Patient not taking: Reported on 11/10/2023    [provider]  lovastatin (MEVACOR) 40 MG tablet Take 40 mg by mouth at bedtime. Patient not taking: Reported on 11/10/2023    [provider]  oxyCODONE  (ROXICODONE ) 5 MG immediate release tablet Take 1 tablet (5 mg total) by mouth every 4 (four) hours as needed for moderate pain  or severe pain (5mg  for moderate pain (3-6/10), 10mg  for severe pain (7-10/10)). Patient not taking: Reported on 11/10/2023 09/28/19   Zdeb, Christine, NP   MR HAND LEFT W WO CONTRAST Result Date: 11/12/2023 CLINICAL DATA:  Recent cat bite. Left  hand pain and swelling. Soft tissue infection suspected. History of diabetes. EXAM: MRI OF THE LEFT HAND WITHOUT AND WITH CONTRAST TECHNIQUE: Multiplanar, multisequence MR imaging of the left hand was performed before and after the administration of intravenous contrast. CONTRAST:  10mL GADAVIST  GADOBUTROL  1 MMOL/ML IV SOLN COMPARISON:  Left wrist radiographs 11/09/2023 FINDINGS: Bones/Joint/Cartilage This examination includes most of the hand. The proximal carpal row is excluded. There is no evidence of acute fracture, dislocation or osteomyelitis. As seen radiographically, there are advanced degenerative changes at the 1st carpometacarpal joint with osteophytes, subchondral cyst formation and mild radial subluxation of the 1st metacarpal. There are additional mild degenerative changes involving the scaphotrapeziotrapezoidal, 2nd and 3rd metacarpal phalangeal joints. No significant joint effusions are identified. Ligaments The collateral ligaments of the metacarpal phalangeal joints appear intact. Proximal row intercarpal ligaments not imaged. Muscles and Tendons There is a moderate amount of fluid in the extensor tendon sheaths of the extensor carpi radialis brevis and extensor pollicis longus tendons at the level of the distal carpal row. There is associated synovial enhancement of these tendon sheaths following contrast, suspicious for infectious tenosynovitis. The proximal extent of this process may be incompletely visualized by this examination. On the coronal images, the extensor digitorum longus tendon of the long finger appears lax. There is focal T2 hyperintensity around the tendon at the level of the 3rd proximal phalanx on the sagittal images, although no focal  fluid collection is apparent in this area on the postcontrast axial or coronal images. No focal intramuscular fluid collection or suspicious muscular enhancement identified. Soft tissues Generalized soft tissue swelling throughout the hand and fingers, greatest dorsally. As above, extensor tendon sheath enhancement, suspicious for infectious tenosynovitis. Generalized subcutaneous enhancement, suspicious for cellulitis. No other focal fluid collections are identified. IMPRESSION: 1. Findings are suspicious for infectious tenosynovitis involving the extensor carpi radialis brevis and extensor pollicis longus tendons at the level of the distal carpal row. The proximal extent of this process may be incompletely visualized by this examination. 2. Possible focal tenosynovitis and/or partial tear involving the extensor digitorum longus tendon of the long finger at the level of the 3rd proximal phalanx. Correlate with specific site of bite. 3. Generalized soft tissue swelling and subcutaneous enhancement, suspicious for cellulitis. No other focal fluid collections identified. 4. No evidence of osteomyelitis or septic arthritis. The wrist is incompletely visualized. 5. Advanced degenerative changes at the 1st carpometacarpal joint. Electronically Signed   By: Elsie Perone M.D.   On: 11/12/2023 10:27    Positive ROS: All other systems have been reviewed and were otherwise negative with the exception of those mentioned in the HPI and as above.  Physical Exam: General:  Alert, no acute distress Psychiatric:  Patient is competent for consent with normal mood and affect   Cardiovascular:  No pedal edema Respiratory:  No wheezing, non-labored breathing GI:  Abdomen is soft and non-tender Skin:  No lesions in the area of chief complaint Neurologic:  Sensation intact distally Lymphatic:  No axillary or cervical lymphadenopathy  Orthopedic Exam:  Left upper extremity Skin intact over the dorsum of the wrist and  hand except for a few small punctate eschars consistent with the cat bite at the dorsal radial aspect of the wrist and hand Cutaneous swelling noted over the dorsum of the first metacarpal and second metacarpal and over the distal dorsal wrist.  No fluctuant collections or any fluid collections noted.  Mildly tender over the extensor tendon sheaths and EPL Tenderness over the  dorsum of the wrist and extensor tendons no fluid collections Pain with resisted wrist extension No pain with passive motion of the fingers or wrist. Neurovascular intact with no streaking erythema Intact radial pulse brisk capillary refill  Imaging:  MRI of the left hand images and report reviewed by myself.  There is fluid in the extensor tendons especially ECRB and EPL.  Fluid signal continues up the tendon sheath.  No joint involvement, no flexor involvement, no evidence of osteomyelitis or septic arthritis.  Agree with radiologist interpretation full read above  Assessment: Left hand extensor tenosynovitis  Plan: I reviewed the clinical and radiographic findings with the patient.  He has a extensor tenosynovitis likely infectious secondary to recent cat bite.  He had significant improvement over 24 hours with IV antibiotics.  No palpable fluid collections that appear amenable to any sort of irrigation debridement at this time.  We continue to treat as a cellulitis/extensor tenosynovitis with IV antibiotics.  The dorsal wrist pain appears to be tracking up the tendon sheaths consistent with a tenosynovitis, no clinical indication for additional imaging at this time.  All this was reviewed with medical team and the patient agree with the above plan.  Recommend warm compresses and continue IV antibiotics.  Will see how the wrist looks tomorrow.  If there is development of a focal abscess or other concerning drainable collection then we may consider an I&D but at this time there is no plan for surgical intervention.   Arthea Sheer MD  Beeper #:  [phone]  11/12/2023 1:22 PM

## 2023-11-12 NOTE — Plan of Care (Signed)

## 2023-11-12 NOTE — Progress Notes (Signed)
  PROGRESS NOTE    Zachary PASSERO Sr.  FMW:[id number] DOB: 1945-04-09 DOA: 11/10/2023 PCP: Pcp, No  160A/160A-AA  LOS: 2 days   Brief hospital course:   Assessment & Plan:  Zachary JINNY Rayas Sr. is a 78 y.o. male with medical history significant of hypertension, hyperlipidemia, CAD status post CABG x 3, COPD, diabetes diet-controlled who presents to the hospital with complaint of left arm swelling after cat bite.  Patient states that he was bit by the cat few days ago.    Left arm cellulitis  extensor tenosynovitis  2/2 Cat bite Started on Vanc and doxy on presentation, switched to Unasyn .  MRI hand ordered on admission. --cont Unasyn  --ortho consult today   DM type 2 --recent A1c 5.9 --no need for BG checks   Hypertension --cont losartan     Hyponatremia --low 130's --monitor   COPD --cont home bronchodilators   CAD s/p CABG --cont ASA    DVT prophylaxis: Lovenox  SQ Code Status: Full code  Family Communication:  Level of care: Telemetry Surgical Dispo:   The patient is from: home Anticipated d/c is to: home Anticipated d/c date is: 2-3 days   Subjective and Interval History:  Pt reported arm/wrist feeling better, can move it now.    Objective: Vitals:   11/11/23 1952 11/12/23 0337 11/12/23 0748 11/12/23 1458  BP: (!) 143/69 136/67 130/84 116/66  Pulse: 69 62 67 76  Resp: 20 18 17 17   Temp: 98.5 F (36.9 C) 98 F (36.7 C) 97.9 F (36.6 C) 98.3 F (36.8 C)  TempSrc:   Oral   SpO2: 96% 93% 96% 94%  Weight:      Height:        Intake/Output Summary (Last 24 hours) at 11/12/2023 1744 Last data filed at 11/12/2023 0900 Gross per 24 hour  Intake 580 ml  Output --  Net 580 ml   Filed Weights   11/10/23 1103 11/10/23 1104  Weight: (P) 106.6 kg 106.6 kg    Examination:   Constitutional: NAD, AAOx3 HEENT: conjunctivae and lids normal, EOMI CV: No cyanosis.   RESP: normal respiratory effort, on RA Extremities: left hand and forearm less  erythematous and less swelling SKIN: warm, dry Neuro: II - XII grossly intact.   Psych: Normal mood and affect.  Appropriate judgement and reason   Data Reviewed: I have personally reviewed labs and imaging studies  Time spent: 35 minutes  Ellouise Haber, MD [facility] If 7PM-7AM, please contact night-coverage 11/12/2023, 5:44 PM

## 2023-11-13 DIAGNOSIS — L03114 Cellulitis of left upper limb: Secondary | ICD-10-CM | POA: Diagnosis not present

## 2023-11-13 NOTE — Progress Notes (Signed)
 Subjective: Patient reports improvement in his left hand overnight decrease swelling and decreased erythema.  Reports it seems significantly improved and he is able to move it better today.  Denies any new areas of swelling or fluctuance around his hand or forearm.  He has been doing warm compresses which he says feels good.  And he is keeping the arm elevated. Denies any numbness tingling fevers or chills.  Objective: Vital signs in last 24 hours: Temp:  [97.7 F (36.5 C)-98.2 F (36.8 C)] 97.7 F (36.5 C) (09/27 0750) Pulse Rate:  [61-76] 61 (09/27 0750) Resp:  [18-19] 18 (09/27 0750) BP: (126-145)/(64-76) 136/64 (09/27 0750) SpO2:  [94 %-96 %] 94 % (09/27 0750)  Intake/Output from previous day: 09/26 0701 - 09/27 0700 In: 1156.8 [P.O.:720; IV Piggyback:436.8] Out: -  Intake/Output this shift: Total I/O In: 240 [P.O.:240] Out: -   No results for input(s): HGB in the last 72 hours. No results for input(s): WBC, RBC, HCT, PLT in the last 72 hours. No results for input(s): NA, K, CL, CO2, BUN, CREATININE, GLUCOSE, CALCIUM in the last 72 hours. No results for input(s): LABPT, INR in the last 72 hours.  Orthopedic Exam:  Left upper extremity Skin intact over the dorsum of the wrist and hand except for a few small punctate eschars consistent with the cat bite at the dorsal radial aspect of the wrist and hand Cutaneous swelling noted over the dorsum of the first metacarpal and second metacarpal and over the distal dorsal wrist decreased from yesterday.  No fluctuant collections or any fluid collections noted.  Mildly tender over the extensor tendon sheaths and EPL Tenderness over the dorsum of the wrist and extensor tendons no fluid collections palpable Decrease in pain compared to yesterday on resisted wrist flexion and extension No pain with passive motion of the fingers or wrist. Neurovascular intact with no streaking erythema Intact radial pulse brisk  capillary refill  MRI of the left hand images and report reviewed by myself. There is fluid in the extensor tendons especially ECRB and EPL. Fluid signal continues up the tendon sheath. No joint involvement, no flexor involvement, no evidence of osteomyelitis or septic arthritis. Agree with radiologist interpretation full read above   Assessment: Left hand extensor tenosynovitis/cellulitis  Plan: The patient has clinically improved overnight on IV antibiotics.  I do not feel any areas of palpable fluctuance or any abscesses which would be amenable to drainage at this time.  I would recommend continuing with IV antibiotics as treatment for extensor tenosynovitis/cellulitis.  Hopefully will be able to transition to oral antibiotics in the next day or 2 with outpatient follow-up.  Continue with warm compresses and elevation.  No plan for surgical intervention on the left hand or wrist at this time.  All of this was discussed with the patient who agrees with above plan and all of his questions were answered.   Zachary Trevino 11/13/2023, 3:15 PM

## 2023-11-13 NOTE — Progress Notes (Signed)
  PROGRESS NOTE    Zachary Trevino Sr.  FMW:[id number] DOB: Nov 17, 1945 DOA: 11/10/2023 PCP: Pcp, No  160A/160A-AA  LOS: 3 days   Brief hospital course:   Assessment & Plan:  Zachary JINNY Rayas Sr. is a 78 y.o. male with medical history significant of hypertension, hyperlipidemia, CAD status post CABG x 3, COPD, diabetes diet-controlled who presents to the hospital with complaint of left arm swelling after cat bite.  Patient states that he was bit by the cat few days ago.    Left arm cellulitis  extensor tenosynovitis  2/2 Cat bite Started on Vanc and doxy on presentation, switched to Unasyn .  MRI hand ordered on admission.  Ortho consulted. --cont Unasyn  --warm compresses and elevation per ortho   DM type 2 --recent A1c 5.9 --no need for BG checks   Hypertension --cont losartan     Hyponatremia --low 130's --monitor   COPD --cont home bronchodilators   CAD s/p CABG --cont ASA   DVT prophylaxis: Lovenox  SQ Code Status: Full code  Family Communication: wife updated at bedside today Level of care: Telemetry Surgical Dispo:   The patient is from: home Anticipated d/c is to: home Anticipated d/c date is: 1-2 days   Subjective and Interval History:  Pt reported he can move his hand more today.   Objective: Vitals:   11/12/23 0748 11/13/23 0458 11/13/23 0750 11/13/23 1613  BP:  126/76 136/64 (!) 127/57  Pulse:  76 61 65  Resp:  19 18 16   Temp:  97.7 F (36.5 C) 97.7 F (36.5 C) 98.2 F (36.8 C)  TempSrc: Oral Oral  Oral  SpO2:  94% 94% 95%  Weight:      Height:        Intake/Output Summary (Last 24 hours) at 11/13/2023 1800 Last data filed at 11/13/2023 1100 Gross per 24 hour  Intake 1156.78 ml  Output --  Net 1156.78 ml   Filed Weights   11/10/23 1103 11/10/23 1104  Weight: (P) 106.6 kg 106.6 kg    Examination:   Constitutional: NAD, AAOx3 HEENT: conjunctivae and lids normal, EOMI CV: No cyanosis.   RESP: normal respiratory effort, on  RA Extremities: some erythema and edema in left hand and forearm though improved. SKIN: warm, dry Neuro: II - XII grossly intact.   Psych: Normal mood and affect.  Appropriate judgement and reason   Data Reviewed: I have personally reviewed labs and imaging studies  Time spent: 35 minutes  Zachary Haber, MD [facility] If 7PM-7AM, please contact night-coverage 11/13/2023, 6:00 PM

## 2023-11-13 NOTE — Plan of Care (Signed)

## 2023-11-13 NOTE — Plan of Care (Signed)

## 2023-11-14 DIAGNOSIS — L03114 Cellulitis of left upper limb: Secondary | ICD-10-CM | POA: Diagnosis not present

## 2023-11-14 LAB — CULTURE, BLOOD (ROUTINE X 2)
Culture: NO GROWTH
Culture: NO GROWTH
Special Requests: ADEQUATE

## 2023-11-14 MED ORDER — DIPHENHYDRAMINE HCL 25 MG PO CAPS: 25.0000 mg | ORAL_CAPSULE | ORAL | Status: DC | PRN

## 2023-11-14 NOTE — Progress Notes (Signed)
  PROGRESS NOTE    Zachary Trevino.  FMW:[id number] DOB: 1945-11-19 DOA: 11/10/2023 PCP: Pcp, No  160A/160A-AA  LOS: 4 days   Brief hospital course:   Assessment & Plan:  Zachary JINNY Rayas Trevino. is a 78 y.o. male with medical history significant of hypertension, hyperlipidemia, CAD status post CABG x 3, COPD, diabetes diet-controlled who presents to the hospital with complaint of left arm swelling after cat bite.  Patient states that he was bit by the cat few days ago.    Left arm cellulitis  extensor tenosynovitis  2/2 Cat bite Started on Vanc and doxy on presentation, switched to Unasyn .  MRI hand ordered on admission.  Ortho consulted. --cont Unasyn  --warm compresses and elevation per ortho --outpatient f/u with hand specialist Dr. Ezra   DM type 2 --recent A1c 5.9 --no need for BG checks   Hypertension --cont losartan     Hyponatremia --low 130's --monitor   COPD --cont bronchodilators   CAD s/p CABG --cont ASA   DVT prophylaxis: Lovenox  SQ Code Status: Full code  Family Communication:  Level of care: Telemetry Surgical Dispo:   The patient is from: home Anticipated d/c is to: home Anticipated d/c date is: tomorrow   Subjective and Interval History:  Pt reported hand continued to improve.     Objective: Vitals:   11/13/23 2059 11/14/23 0500 11/14/23 0826 11/14/23 1651  BP: (!) 168/72 130/70 135/71 134/76  Pulse: (!) 56 62 61 61  Resp: 18 18 17 17   Temp: 98.1 F (36.7 C) 98.1 F (36.7 C) 98 F (36.7 C) 97.8 F (36.6 C)  TempSrc: Oral Oral    SpO2: 95% 98% 94% 94%  Weight:      Height:        Intake/Output Summary (Last 24 hours) at 11/14/2023 1809 Last data filed at 11/14/2023 1300 Gross per 24 hour  Intake 770 ml  Output --  Net 770 ml   Filed Weights   11/10/23 1103 11/10/23 1104  Weight: (P) 106.6 kg 106.6 kg    Examination:   Constitutional: NAD, AAOx3 HEENT: conjunctivae and lids normal, EOMI CV: No cyanosis.   RESP:  normal respiratory effort, on RA Extremities: swelling around the base of left thumb SKIN: warm, dry Neuro: II - XII grossly intact.   Psych: Normal mood and affect.  Appropriate judgement and reason   Data Reviewed: I have personally reviewed labs and imaging studies  Time spent: 35 minutes  Ellouise Haber, MD [facility] If 7PM-7AM, please contact night-coverage 11/14/2023, 6:09 PM

## 2023-11-14 NOTE — Progress Notes (Signed)
 Patient seen and examined at bedside today.  The left hand and wrist continue to improve on exam compared to yesterday.  No fluctuance or areas which appear amenable to drainage.  Discussed continuing conservative IV antibiotics likely transition to oral antibiotics at home tomorrow.  No plan for surgical intervention at this time.  If he leaves tomorrow we will arrange for follow-up later this week with my partner Dr. Ezra who is a hand specialist.  All questions answered and patient agrees with the above plan.

## 2023-11-15 DIAGNOSIS — L03114 Cellulitis of left upper limb: Secondary | ICD-10-CM | POA: Diagnosis not present

## 2023-11-15 LAB — CBC
HCT: 37 % — ABNORMAL LOW (ref 39.0–52.0)
Hemoglobin: 12.6 g/dL — ABNORMAL LOW (ref 13.0–17.0)
MCH: 31.9 pg (ref 26.0–34.0)
MCHC: 34.1 g/dL (ref 30.0–36.0)
MCV: 93.7 fL (ref 80.0–100.0)
Platelets: 285 K/uL (ref 150–400)
RBC: 3.95 MIL/uL — ABNORMAL LOW (ref 4.22–5.81)
RDW: 11.9 % (ref 11.5–15.5)
WBC: 11 K/uL — ABNORMAL HIGH (ref 4.0–10.5)
nRBC: 0 % (ref 0.0–0.2)

## 2023-11-15 LAB — BASIC METABOLIC PANEL WITH GFR
Anion gap: 8 (ref 5–15)
BUN: 25 mg/dL — ABNORMAL HIGH (ref 8–23)
CO2: 26 mmol/L (ref 22–32)
Calcium: 8.9 mg/dL (ref 8.9–10.3)
Chloride: 103 mmol/L (ref 98–111)
Creatinine, Ser: 1.15 mg/dL (ref 0.61–1.24)
GFR, Estimated: >60 mL/min (ref >60)
Glucose, Bld: 123 mg/dL — ABNORMAL HIGH (ref 70–99)
Potassium: 4.2 mmol/L (ref 3.5–5.1)
Sodium: 137 mmol/L (ref 135–145)

## 2023-11-15 LAB — MAGNESIUM: Magnesium: 2.4 mg/dL (ref 1.7–2.4)

## 2023-11-15 NOTE — Plan of Care (Signed)

## 2023-11-15 NOTE — Discharge Summary (Signed)
 Physician Discharge Summary   Zachary SHREWSBURY Sr.  male DOB: 1946/01/25  FMW:[id number]  PCP: Pcp, No  Admit date: 11/10/2023 Discharge date: 11/15/2023  Admitted From: home Disposition:  home CODE STATUS: Full code   Hospital Course:  For full details, please see H&P, progress notes, consult notes and ancillary notes.  Briefly,  Zachary BONET Sr. is a 78 y.o. male with medical history significant of hypertension, CAD status post CABG x 3, COPD, diabetes diet-controlled who presented to the hospital with complaint of left arm swelling after cat bite.  Patient stated that he was bit by the cat few days ago.    Left arm cellulitis  extensor tenosynovitis  2/2 Cat bite MRI hand ordered on admission.  Ortho consulted. --Started on Vanc and doxy on presentation, switched to Unasyn .  Received Unasyn  for 4 days and discharged on Augmentin  (already had Rx for 7 days of Augmentin  from ED provider). --outpatient f/u with hand specialist Dr. Ezra   DM type 2 --recent A1c 5.9 --no need for BG checks   Hypertension --cont losartan     Hyponatremia --low 130's.  Normalized prior to discharge.   COPD --cont bronchodilators   CAD s/p CABG --cont ASA and statin   Unless noted above, medications under STOP list are ones pt was not taking PTA.  Discharge Diagnoses:  Principal Problem:   Cellulitis of arm, left Active Problems:   Extensor tenosynovitis of left wrist   30 Day Unplanned Readmission Risk Score    Flowsheet Row ED to Hosp-Admission (Current) from 11/10/2023 in [facility] (1A)  30 Day Unplanned Readmission Risk Score (%) 18.05 Filed at 11/15/2023 0801    This score is the patient's risk of an unplanned readmission within 30 days of being discharged (0 -100%). The score is based on dignosis, age, lab data, medications, orders, and past utilization.   Low:  0-14.9   Medium: 15-21.9   High: 22-29.9   Extreme: 30 and above          Discharge Instructions:  Allergies as of 11/15/2023       Reactions   Evolocumab Dermatitis   Atorvastatin Palpitations   Rosuvastatin Palpitations        Medication List     STOP taking these medications    benzonatate  100 MG capsule Commonly known as: TESSALON    ezetimibe 10 MG tablet Commonly known as: ZETIA   HYDROcodone -acetaminophen  5-325 MG tablet Commonly known as: Norco   ipratropium 0.06 % nasal spray Commonly known as: ATROVENT    ipratropium-albuterol  0.5-2.5 (3) MG/3ML Soln Commonly known as: DUONEB   oxyCODONE  5 MG immediate release tablet Commonly known as: Roxicodone    oxyCODONE -acetaminophen  5-325 MG tablet Commonly known as: Percocet       TAKE these medications    acetaminophen  500 MG tablet Commonly known as: TYLENOL  Take 2 tablets (1,000 mg total) by mouth every 6 (six) hours.   albuterol  108 (90 Base) MCG/ACT inhaler Commonly known as: VENTOLIN  HFA Inhale 2 puffs into the lungs every 4 (four) hours as needed for wheezing or shortness of breath.   Alpha-Lipoic Acid 200 MG Caps Take 200 mg by mouth daily.   alum & mag hydroxide-simeth 200-200-20 MG/5ML suspension Commonly known as: MAALOX/MYLANTA Take 30 mLs by mouth every 6 (six) hours as needed for indigestion.   amoxicillin -clavulanate 875-125 MG tablet Commonly known as: AUGMENTIN  Take 1 tablet by mouth 2 (two) times daily for 7 days.   aspirin  EC 81 MG tablet  Take 81 mg by mouth daily. Swallow whole.   bisacodyl  5 MG EC tablet Commonly known as: DULCOLAX Take 1 tablet (5 mg total) by mouth daily as needed for moderate constipation.   Cinnamon 500 MG Tabs Take 500 mg by mouth daily.   COENZYME Q-10 PO Take 1 capsule by mouth daily.   docusate sodium  100 MG capsule Commonly known as: COLACE Take 100 mg by mouth daily.   escitalopram  20 MG tablet Commonly known as: LEXAPRO  Take 20 mg by mouth daily. What changed: Another medication with the same name was  removed. Continue taking this medication, and follow the directions you see here.   Fluticasone -Salmeterol 100-50 MCG/DOSE Aepb Commonly known as: ADVAIR Inhale 1 puff into the lungs 2 (two) times daily.   gabapentin  300 MG capsule Commonly known as: Neurontin  Take 2 capsules (600 mg total) by mouth 3 (three) times daily.   losartan  100 MG tablet Commonly known as: COZAAR  Take 50 mg by mouth daily.   lovastatin 20 MG tablet Commonly known as: MEVACOR Take 20 mg by mouth daily at 6 PM. What changed: Another medication with the same name was removed. Continue taking this medication, and follow the directions you see here.   menthol  3 MG lozenge Commonly known as: CEPACOL Take 1 lozenge (3 mg total) by mouth as needed for sore throat (sore throat).   multivitamin tablet Take 1 tablet by mouth daily.   omeprazole 40 MG capsule Commonly known as: PRILOSEC Take 40 mg by mouth daily.   ondansetron  4 MG disintegrating tablet Commonly known as: ZOFRAN -ODT Take 1 tablet (4 mg total) by mouth every 8 (eight) hours as needed for nausea or vomiting.   phenol 1.4 % Liqd Commonly known as: CHLORASEPTIC Use as directed 1 spray in the mouth or throat as needed for throat irritation / pain.   polyethylene glycol 17 g packet Commonly known as: MIRALAX  / GLYCOLAX  Take 17 g by mouth daily.   promethazine -dextromethorphan 6.25-15 MG/5ML syrup Commonly known as: PROMETHAZINE -DM Take 5 mLs by mouth 4 (four) times daily as needed.   senna 8.6 MG Tabs tablet Commonly known as: SENOKOT Take 2 tablets (17.2 mg total) by mouth 2 (two) times daily.   Turmeric 400 MG Caps Take 400 mg by mouth daily.   vitamin C 1000 MG tablet Take 1,000 mg by mouth daily.   VITAMIN D3 PO Take 2 tablets by mouth daily.         Follow-up Information     Ezra Jackquline RAMAN, MD Follow up in 1 week(s).   Specialty: Orthopedic Surgery Why: Hand specialist. Contact information: [address] [place] [address] [phone]                 Allergies  Allergen Reactions   Evolocumab Dermatitis   Atorvastatin Palpitations   Rosuvastatin Palpitations     The results of significant diagnostics from this hospitalization (including imaging, microbiology, ancillary and laboratory) are listed below for reference.   Consultations:   Procedures/Studies: MR HAND LEFT W WO CONTRAST Result Date: 11/12/2023 CLINICAL DATA:  Recent cat bite. Left hand pain and swelling. Soft tissue infection suspected. History of diabetes. EXAM: MRI OF THE LEFT HAND WITHOUT AND WITH CONTRAST TECHNIQUE: Multiplanar, multisequence MR imaging of the left hand was performed before and after the administration of intravenous contrast. CONTRAST:  10mL GADAVIST  GADOBUTROL  1 MMOL/ML IV SOLN COMPARISON:  Left wrist radiographs 11/09/2023 FINDINGS: Bones/Joint/Cartilage This examination includes most of the hand. The proximal  carpal row is excluded. There is no evidence of acute fracture, dislocation or osteomyelitis. As seen radiographically, there are advanced degenerative changes at the 1st carpometacarpal joint with osteophytes, subchondral cyst formation and mild radial subluxation of the 1st metacarpal. There are additional mild degenerative changes involving the scaphotrapeziotrapezoidal, 2nd and 3rd metacarpal phalangeal joints. No significant joint effusions are identified. Ligaments The collateral ligaments of the metacarpal phalangeal joints appear intact. Proximal row intercarpal ligaments not imaged. Muscles and Tendons There is a moderate amount of fluid in the extensor tendon sheaths of the extensor carpi radialis brevis and extensor pollicis longus tendons at the level of the distal carpal row. There is associated synovial enhancement of these tendon sheaths following contrast, suspicious for infectious tenosynovitis. The proximal extent of this process may be incompletely visualized by this  examination. On the coronal images, the extensor digitorum longus tendon of the long finger appears lax. There is focal T2 hyperintensity around the tendon at the level of the 3rd proximal phalanx on the sagittal images, although no focal fluid collection is apparent in this area on the postcontrast axial or coronal images. No focal intramuscular fluid collection or suspicious muscular enhancement identified. Soft tissues Generalized soft tissue swelling throughout the hand and fingers, greatest dorsally. As above, extensor tendon sheath enhancement, suspicious for infectious tenosynovitis. Generalized subcutaneous enhancement, suspicious for cellulitis. No other focal fluid collections are identified. IMPRESSION: 1. Findings are suspicious for infectious tenosynovitis involving the extensor carpi radialis brevis and extensor pollicis longus tendons at the level of the distal carpal row. The proximal extent of this process may be incompletely visualized by this examination. 2. Possible focal tenosynovitis and/or partial tear involving the extensor digitorum longus tendon of the long finger at the level of the 3rd proximal phalanx. Correlate with specific site of bite. 3. Generalized soft tissue swelling and subcutaneous enhancement, suspicious for cellulitis. No other focal fluid collections identified. 4. No evidence of osteomyelitis or septic arthritis. The wrist is incompletely visualized. 5. Advanced degenerative changes at the 1st carpometacarpal joint. Electronically Signed   By: Elsie Perone M.D.   On: 11/12/2023 10:27   DG Wrist Complete Left Result Date: 11/09/2023 CLINICAL DATA:  animal bite.  Pain/swelling. EXAM: LEFT WRIST - COMPLETE 3+ VIEW COMPARISON:  None Available. FINDINGS: No acute fracture or dislocation. No aggressive osseous lesion. Mild diffuse arthritis of imaged joints with asymmetric moderate-to-severe involvement of first carpometacarpal joint. No radiopaque foreign bodies. Soft  tissues are within normal limits. No focal soft tissue swelling. No evidence of soft tissue defect/tissue loss or air within the soft tissue. IMPRESSION: *No acute osseous abnormality of the left wrist joint. No radiopaque foreign bodies. Electronically Signed   By: Ree Molt M.D.   On: 11/09/2023 09:12   DG Chest Port 1 View Result Date: 11/09/2023 CLINICAL DATA:  Questionable sepsis - evaluate for abnormality. EXAM: PORTABLE CHEST 1 VIEW COMPARISON:  07/13/2021. FINDINGS: Linear area of scarring/atelectasis noted overlying the left lateral costophrenic angle. Bilateral lung fields are otherwise clear. No dense consolidation or lung collapse. Bilateral costophrenic angles are clear. Normal cardio-mediastinal silhouette. There are surgical staples along the heart border and sternotomy wires, status post CABG (coronary artery bypass graft). No acute osseous abnormalities. Cervicothoracic spinal fixation hardware noted. The soft tissues are within normal limits. IMPRESSION: No active disease. Electronically Signed   By: Ree Molt M.D.   On: 11/09/2023 09:10      Labs: BNP (last 3 results) No results for input(s): BNP in the last  8760 hours. Basic Metabolic Panel: Recent Labs  Lab 11/09/23 0811 11/10/23 1154 11/15/23 0509  NA 132* 133* 137  K 4.0 4.9 4.2  CL 99 102 103  CO2 22 22 26   GLUCOSE 173* 107* 123*  BUN 25* 26* 25*  CREATININE 1.21 1.24 1.15  CALCIUM 8.5* 8.5* 8.9  MG  --   --  2.4   Liver Function Tests: Recent Labs  Lab 11/09/23 0811  AST 18  ALT 13  ALKPHOS 63  BILITOT 1.0  PROT 6.9  ALBUMIN 3.8   No results for input(s): LIPASE, AMYLASE in the last 168 hours. No results for input(s): AMMONIA in the last 168 hours. CBC: Recent Labs  Lab 11/09/23 0811 11/10/23 1233 11/15/23 0509  WBC 17.8* 13.2* 11.0*  NEUTROABS 14.7* 9.3*  --   HGB 12.4* 12.1* 12.6*  HCT 35.7* 36.5* 37.0*  MCV 93.7 97.1 93.7  PLT 204 188 285   Cardiac Enzymes: No results  for input(s): CKTOTAL, CKMB, CKMBINDEX, TROPONINI in the last 168 hours. BNP: Invalid input(s): POCBNP CBG: No results for input(s): GLUCAP in the last 168 hours. D-Dimer No results for input(s): DDIMER in the last 72 hours. Hgb A1c No results for input(s): HGBA1C in the last 72 hours. Lipid Profile No results for input(s): CHOL, HDL, LDLCALC, TRIG, CHOLHDL, LDLDIRECT in the last 72 hours. Thyroid function studies No results for input(s): TSH, T4TOTAL, T3FREE, THYROIDAB in the last 72 hours.  Invalid input(s): FREET3 Anemia work up No results for input(s): VITAMINB12, FOLATE, FERRITIN, TIBC, IRON, RETICCTPCT in the last 72 hours. Urinalysis    Component Value Date/Time   COLORURINE YELLOW (A) 11/09/2023 1012   APPEARANCEUR CLEAR (A) 11/09/2023 1012   LABSPEC 1.009 11/09/2023 1012   PHURINE 7.0 11/09/2023 1012   GLUCOSEU NEGATIVE 11/09/2023 1012   HGBUR NEGATIVE 11/09/2023 1012   BILIRUBINUR NEGATIVE 11/09/2023 1012   KETONESUR NEGATIVE 11/09/2023 1012   PROTEINUR NEGATIVE 11/09/2023 1012   NITRITE NEGATIVE 11/09/2023 1012   LEUKOCYTESUR NEGATIVE 11/09/2023 1012   Sepsis Labs Recent Labs  Lab 11/09/23 0811 11/10/23 1233 11/15/23 0509  WBC 17.8* 13.2* 11.0*   Microbiology Recent Results (from the past 240 hours)  Resp panel by RT-PCR (RSV, Flu A&B, Covid) Anterior Nasal Swab     Status: None   Collection Time: 11/09/23  8:10 AM   Specimen: Anterior Nasal Swab  Result Value Ref Range Status   SARS Coronavirus 2 by RT PCR NEGATIVE NEGATIVE Final    Comment: (NOTE) SARS-CoV-2 target nucleic acids are NOT DETECTED.  The SARS-CoV-2 RNA is generally detectable in upper respiratory specimens during the acute phase of infection. The lowest concentration of SARS-CoV-2 viral copies this assay can detect is 138 copies/mL. A negative result does not preclude SARS-Cov-2 infection and should not be used as the sole basis for  treatment or other patient management decisions. A negative result may occur with  improper specimen collection/handling, submission of specimen other than nasopharyngeal swab, presence of viral mutation(s) within the areas targeted by this assay, and inadequate number of viral copies(<138 copies/mL). A negative result must be combined with clinical observations, patient history, and epidemiological information. The expected result is Negative.  Fact Sheet for Patients:  BloggerCourse.com  Fact Sheet for Healthcare Providers:  SeriousBroker.it  This test is no t yet approved or cleared by the [place]  FDA and  has been authorized for detection and/or diagnosis of SARS-CoV-2 by FDA under an Emergency Use Authorization (EUA). This EUA will remain  in  effect (meaning this test can be used) for the duration of the COVID-19 declaration under Section 564(b)(1) of the Act, 21 U.S.C.section 360bbb-3(b)(1), unless the authorization is terminated  or revoked sooner.       Influenza A by PCR NEGATIVE NEGATIVE Final   Influenza B by PCR NEGATIVE NEGATIVE Final    Comment: (NOTE) The Xpert Xpress SARS-CoV-2/FLU/RSV plus assay is intended as an aid in the diagnosis of influenza from Nasopharyngeal swab specimens and should not be used as a sole basis for treatment. Nasal washings and aspirates are unacceptable for Xpert Xpress SARS-CoV-2/FLU/RSV testing.  Fact Sheet for Patients: BloggerCourse.com  Fact Sheet for Healthcare Providers: SeriousBroker.it  This test is not yet approved or cleared by the [place]  FDA and has been authorized for detection and/or diagnosis of SARS-CoV-2 by FDA under an Emergency Use Authorization (EUA). This EUA will remain in effect (meaning this test can be used) for the duration of the COVID-19 declaration under Section 564(b)(1) of the Act, 21  U.S.C. section 360bbb-3(b)(1), unless the authorization is terminated or revoked.     Resp Syncytial Virus by PCR NEGATIVE NEGATIVE Final    Comment: (NOTE) Fact Sheet for Patients: BloggerCourse.com  Fact Sheet for Healthcare Providers: SeriousBroker.it  This test is not yet approved or cleared by the [place]  FDA and has been authorized for detection and/or diagnosis of SARS-CoV-2 by FDA under an Emergency Use Authorization (EUA). This EUA will remain in effect (meaning this test can be used) for the duration of the COVID-19 declaration under Section 564(b)(1) of the Act, 21 U.S.C. section 360bbb-3(b)(1), unless the authorization is terminated or revoked.  Performed at [facility], [address] [place], [place] [address]   Blood Culture (routine x 2)     Status: None   Collection Time: 11/09/23  8:11 AM   Specimen: BLOOD  Result Value Ref Range Status   Specimen Description BLOOD BLOOD RIGHT FOREARM  Final   Special Requests   Final    BOTTLES DRAWN AEROBIC AND ANAEROBIC Blood Culture adequate volume   Culture   Final    NO GROWTH 5 DAYS Performed at [facility], [address] [place], [place] [address]    Report Status 11/14/2023 FINAL  Final  Blood Culture (routine x 2)     Status: None   Collection Time: 11/09/23  8:11 AM   Specimen: BLOOD  Result Value Ref Range Status   Specimen Description BLOOD RIGHT ANTECUBITAL  Final   Special Requests   Final    BOTTLES DRAWN AEROBIC AND ANAEROBIC Blood Culture results may not be optimal due to an inadequate volume of blood received in culture bottles   Culture   Final    NO GROWTH 5 DAYS Performed at [facility], [address], [place], [place] [address]    Report Status 11/14/2023 FINAL  Final     Total time spend on discharging this patient, including the last patient exam, discussing the hospital stay, instructions for  ongoing care as it relates to all pertinent caregivers, as well as preparing the medical discharge records, prescriptions, and/or referrals as applicable, is 35 minutes.    Ellouise Haber, MD  [facility] 11/15/2023, 8:35 AM

## 2023-11-17 NOTE — Progress Notes (Signed)
 Chief Complaint No chief complaint on file.   Reason for Visit The patient is a 78 year old male that presented for evaluation of his left wrist and hand.  The patient was seen on November 09, 2023 the emergency room for left hand and wrist cellulitis after cat bite.  He has been on Augmentin  and is much better.  No surgery was performed.  Dr. Lorelle had seen him in the hospital.  He is feeling better and ready to go back to work next Monday.  He is a Naval architect.  He is not using much pain medication besides Tylenol .  His swelling seems to be improving as well.  The patient is a diabetic.   Medications Current Outpatient Medications  Medication Sig Dispense Refill  . acetaminophen  (TYLENOL ) 500 MG tablet Take 2 tablets by mouth every 6 (six) hours as needed    . alpha lipoic acid 200 mg Cap Take 1 capsule by mouth once daily     . aspirin  81 MG EC tablet Take 81 mg by mouth once daily       . cinnamon bark 500 mg capsule Take 500 mg by mouth once daily.    SABRA co-enzyme Q-10, ubiquinone, 100 mg capsule Take 100 mg by mouth once daily.    SABRA docusate (COLACE) 100 MG capsule Take 200 mg by mouth once daily    . escitalopram  oxalate (LEXAPRO ) 20 MG tablet take 1 tablet by mouth every day 90 tablet 3  . ezetimibe (ZETIA) 10 mg tablet Take 1 tablet (10 mg total) by mouth once daily 90 tablet 3  . fluticasone -salmeterol (ADVAIR DISKUS) 100-50 mcg/dose diskus inhaler Inhale 1 inhalation into the lungs every 12 (twelve) hours.    . gabapentin  (NEURONTIN ) 300 MG capsule TAKE 1 CAPSULE BY MOUTH THREE TIMES A DAY 270 capsule 0  . ipratropium-albuterol  (DUO-NEB) nebulizer solution inhale contents of 1 vial every 4 to 6 hours in nebulizer if needed shortness of breath  0  . levocetirizine (XYZAL) 5 MG tablet Take 5 mg by mouth every evening    . losartan  (COZAAR ) 100 MG tablet Take 1 tablet (100 mg total) by mouth once daily 90 tablet 0  . lovastatin (MEVACOR) 20 MG tablet TAKE 1 TABLET BY MOUTH EVERY  DAY WITH DINNER 90 tablet 3  . meloxicam (MOBIC) 15 MG tablet TAKE 1 TABLET BY MOUTH EVERY DAY 90 tablet 0  . meloxicam (MOBIC) 7.5 MG tablet take 1 tablet by mouth every day 90 tablet 3  . multivitamin-lutein (CENTRUM SILVER) tablet Take 1 tablet by mouth once daily    . NON FORMULARY Take 1 tablet by mouth once daily Multibetic (multivitamin for Diabetics)    . omega-3 fatty acids/fish oil 340-1,000 mg capsule Take 1 capsule by mouth once daily 90 capsule 1  . omeprazole (PRILOSEC) 40 MG DR capsule Take 1 capsule (40 mg total) by mouth once daily 90 capsule 3  . PROAIR  HFA 90 mcg/actuation inhaler inhale 2 puffs by mouth every 2 to 4 hours if needed for cough wheezing and shortness of breath  0  . turmeric root extract 1,053 mg Tab Take 1 tablet by mouth once daily     No current facility-administered medications for this visit.    Allergies Crestor [rosuvastatin], Lipitor [atorvastatin], and Repatha pushtronex [evolocumab]  Histories Past Medical History: Past Medical History:  Diagnosis Date  . Allergic state   . Chickenpox   . COPD (chronic obstructive pulmonary disease) (CMS/HHS-HCC)   . Coronary artery disease   .  Diabetes mellitus (CMS/HHS-HCC)    diet controlled  . Hemorrhoids   . Hyperlipidemia   . Hypertension   . S/P coronary artery bypass graft x 3     Past Surgical History: Past Surgical History:  Procedure Laterality Date  . EGD  03/14/2012  . pneumovax  10/2013   Prevnar 14  . COLONOSCOPY  09/06/2017   Tubular adenoma of the colon/Hyperplastic colon polyp/Repeat 51yrs/MUS  . hand surgery  Left 03/14/2018  . deviated septum  10/2018  . REPAIR COMPLEX RETINAL DETACHMENT Bilateral 2021   patient stated it was more of a tear not a full detachment  . C4-7 ANTERIOR CERVICAL DISCECTOMY AND FUSION  09/27/2019   Dr. Reeves Daisy, [facility], GLOBUS  . cataract surgery Bilateral 2022  . COLONOSCOPY  12/08/2021   Tubular adenoma/PHx CP/No repeat due to age/CTL  .  CARDIAC SURGERY    . COLONOSCOPY  02/03/2010, 02/04/2010, 03/14/2012   Dr. EMERSON Mariner @ [facility] - PHPolyps, rpt 5 yrs per MUS  . COLONOSCOPY    . CORONARY ARTERY BYPASS GRAFT    . UPPER GASTROINTESTINAL ENDOSCOPY      Social History: Social History   Socioeconomic History  . Marital status: Married  Tobacco Use  . Smoking status: Former  . Smokeless tobacco: Never  Substance and Sexual Activity  . Alcohol use: Yes    Comment: occasional beer or wine  . Drug use: No   Social Drivers of Corporate investment banker Strain: Low Risk  (08/24/2023)   Overall Financial Resource Strain (CARDIA)   . Difficulty of Paying Living Expenses: Not hard at all  Food Insecurity: No Food Insecurity (11/10/2023)   [facility] from [facility]   Hunger Vital Sign   . Within the past 12 months, you worried that your food would run out before you got the money to buy more.: Never true   . Within the past 12 months, the food you bought just didn't last and you didn't have money to get more.: Never true  Transportation Needs: No Transportation Needs (11/10/2023)   Received from [facility] - Transportation   . In the past 12 months, has lack of transportation kept you from medical appointments or from getting medications?: No   . In the past 12 months, has lack of transportation kept you from meetings, work, or from getting things needed for daily living?: No  Social Connections: Moderately Isolated (11/10/2023)   Received from [facility]   Social Connection and Isolation Panel   . In a typical week, how many times do you talk on the phone with family, friends, or neighbors?: Three times a week   . How often do you get together with friends or relatives?: Three times a week   . How often do you attend church or religious services?: Never   . Do you belong to any clubs or organizations such as church groups, unions, fraternal or athletic groups, or school groups?: No   . How often do you attend  meetings of the clubs or organizations you belong to?: Never   . Are you married, widowed, divorced, separated, never married, or living with a partner?: Married  Housing Stability: Low Risk  (08/24/2023)   Housing Stability Vital Sign   . Unable to Pay for Housing in the Last Year: No   . Number of Times Moved in the Last Year: 0   . Homeless in the Last Year: No    Family History: Family  History  Problem Relation Name Age of Onset  . Dementia Mother    . Mental illness Mother    . Alzheimer's disease Mother    . Prostate cancer Father    . Diabetes type II Father    . Heart murmur Sister    . Rheum arthritis Sister  22  . Heart murmur Brother    . Heart disease Other         grandparents  . Diabetes type II Other         grandparents  . Osteoporosis (Thinning of bones) Other         grandparents  . Colon cancer Other         great aunt    Review of Systems A comprehensive 14 point ROS was performed, reviewed, and the pertinent orthopaedic findings are documented in the HPI.  Exam There were no vitals taken for this visit.   General/Constitutional: NAD, conversant Eyes: Pupils equal and round, extraocular movements intact ENT: atraumatic external nose and ears, moist mucous membranes Respiratory: non-labored breathing, symmetric chest rise, chest sounds clear. Cardiovascular: no visible lower extremity edema, peripheral pulses present, regular rate and rhythm  Skin: normal skin turgor, warm and dry Neurological: cranial nerves grossly intact, sensation grossly intact Psychological:  Appropriate mood and affect; appropriate judgment Musculoskeletal: as detailed below:  General: Well developed, well nourished 78 y.o. male in no apparent distress.  Normal affect.  Normal communication.  Patient answers questions appropriately.  The patient has a normal gait.  There is no antalgic component.  There is no hip lurch.    Left upper extremity: Examination left arm and wrist  reveals mild erythema with edema throughout the dorsal surface of the hand and thumb extending up to the forearm.  There are Bite puncture wounds that have healed.  There is mild sloughing of the skin.  He has no purulence or drainage.  He has improved wrist and hand motion including the thumb.  He has diminished grip strength on the left.  He is neurovascular is intact.   Impression Encounter Diagnoses  Name Primary?  . Cellulitis of left upper extremity Yes  . Type 2 diabetes mellitus with other specified complication, unspecified whether long term insulin  use (CMS/HHS-HCC)     Plan   1.  I discussed the physical exam finding as well as the previous treatment and hospital treatment including antibiotic therapy. 2.  He will stay on the Augmentin  daily. 3.  He will use Tylenol  for pain. 4.  He will return back to work on Monday, November 22, 2023 with no restrictions and normal work. 5.  He will follow-up on an as-needed basis.  This note was generated in part with voice recognition software and I apologize for any typographical errors that were not detected and corrected.  Review of the [place]  CSRS was performed in accordance with the [place] MVE prior to dispensing any controlled substances.   Dorn Krystal Doyne PA-C, [email or web address]

## 2023-12-17 NOTE — Progress Notes (Signed)
 Symptoms of viral illness concerning for possible COVID/influenza
